# Patient Record
Sex: Female | Born: 1945 | Race: Black or African American | Hispanic: No | Smoking: Never smoker
Health system: Southern US, Community
[De-identification: ages and names within clinical notes are randomized; demographics above are authoritative.]

## PROBLEM LIST (undated history)

## (undated) DIAGNOSIS — K219 Gastro-esophageal reflux disease without esophagitis: Secondary | ICD-10-CM

## (undated) DIAGNOSIS — G709 Myoneural disorder, unspecified: Secondary | ICD-10-CM

## (undated) DIAGNOSIS — Z923 Personal history of irradiation: Secondary | ICD-10-CM

## (undated) DIAGNOSIS — R002 Palpitations: Secondary | ICD-10-CM

## (undated) DIAGNOSIS — C50919 Malignant neoplasm of unspecified site of unspecified female breast: Secondary | ICD-10-CM

## (undated) DIAGNOSIS — R21 Rash and other nonspecific skin eruption: Secondary | ICD-10-CM

## (undated) DIAGNOSIS — Z9221 Personal history of antineoplastic chemotherapy: Secondary | ICD-10-CM

## (undated) DIAGNOSIS — F419 Anxiety disorder, unspecified: Secondary | ICD-10-CM

## (undated) DIAGNOSIS — Z853 Personal history of malignant neoplasm of breast: Secondary | ICD-10-CM

## (undated) HISTORY — PX: BREAST SURGERY: SHX581

## (undated) HISTORY — DX: Malignant neoplasm of unspecified site of unspecified female breast: C50.919

## (undated) HISTORY — DX: Personal history of malignant neoplasm of breast: Z85.3

## (undated) HISTORY — DX: Gastro-esophageal reflux disease without esophagitis: K21.9

## (undated) HISTORY — PX: BLADDER SURGERY: SHX569

## (undated) HISTORY — PX: TUBAL LIGATION: SHX77

## (undated) HISTORY — PX: FOOT SURGERY: SHX648

## (undated) HISTORY — DX: Anxiety disorder, unspecified: F41.9

## (undated) HISTORY — DX: Palpitations: R00.2

## (undated) HISTORY — DX: Rash and other nonspecific skin eruption: R21

---

## 1997-11-14 ENCOUNTER — Other Ambulatory Visit: Admission: RE | Admit: 1997-11-14 | Discharge: 1997-11-14 | Payer: Self-pay | Admitting: Gynecology

## 1998-10-31 ENCOUNTER — Other Ambulatory Visit: Admission: RE | Admit: 1998-10-31 | Discharge: 1998-10-31 | Payer: Self-pay | Admitting: Gynecology

## 1999-11-28 ENCOUNTER — Other Ambulatory Visit: Admission: RE | Admit: 1999-11-28 | Discharge: 1999-11-28 | Payer: Self-pay | Admitting: Gynecology

## 2000-03-26 ENCOUNTER — Encounter: Admission: RE | Admit: 2000-03-26 | Discharge: 2000-03-26 | Payer: Self-pay | Admitting: Gynecology

## 2000-03-26 ENCOUNTER — Encounter: Payer: Self-pay | Admitting: Gynecology

## 2000-12-01 ENCOUNTER — Other Ambulatory Visit: Admission: RE | Admit: 2000-12-01 | Discharge: 2000-12-01 | Payer: Self-pay | Admitting: Gynecology

## 2001-03-28 ENCOUNTER — Encounter: Payer: Self-pay | Admitting: Gynecology

## 2001-03-28 ENCOUNTER — Encounter: Admission: RE | Admit: 2001-03-28 | Discharge: 2001-03-28 | Payer: Self-pay | Admitting: Gynecology

## 2001-12-15 ENCOUNTER — Other Ambulatory Visit: Admission: RE | Admit: 2001-12-15 | Discharge: 2001-12-15 | Payer: Self-pay | Admitting: Gynecology

## 2002-02-02 ENCOUNTER — Encounter: Payer: Self-pay | Admitting: Family Medicine

## 2002-02-02 ENCOUNTER — Encounter: Admission: RE | Admit: 2002-02-02 | Discharge: 2002-02-02 | Payer: Self-pay | Admitting: Family Medicine

## 2002-04-07 ENCOUNTER — Encounter: Payer: Self-pay | Admitting: Gynecology

## 2002-04-07 ENCOUNTER — Encounter: Admission: RE | Admit: 2002-04-07 | Discharge: 2002-04-07 | Payer: Self-pay | Admitting: Gynecology

## 2002-12-26 ENCOUNTER — Other Ambulatory Visit: Admission: RE | Admit: 2002-12-26 | Discharge: 2002-12-26 | Payer: Self-pay | Admitting: Gynecology

## 2003-04-27 ENCOUNTER — Encounter: Admission: RE | Admit: 2003-04-27 | Discharge: 2003-04-27 | Payer: Self-pay | Admitting: Gynecology

## 2003-08-21 ENCOUNTER — Emergency Department (HOSPITAL_COMMUNITY): Admission: EM | Admit: 2003-08-21 | Discharge: 2003-08-21 | Payer: Self-pay | Admitting: Emergency Medicine

## 2003-09-05 ENCOUNTER — Encounter: Admission: RE | Admit: 2003-09-05 | Discharge: 2003-09-05 | Payer: Self-pay | Admitting: Family Medicine

## 2004-01-11 ENCOUNTER — Other Ambulatory Visit: Admission: RE | Admit: 2004-01-11 | Discharge: 2004-01-11 | Payer: Self-pay | Admitting: Gynecology

## 2004-05-13 ENCOUNTER — Encounter: Admission: RE | Admit: 2004-05-13 | Discharge: 2004-05-13 | Payer: Self-pay | Admitting: Gynecology

## 2005-01-12 ENCOUNTER — Other Ambulatory Visit: Admission: RE | Admit: 2005-01-12 | Discharge: 2005-01-12 | Payer: Self-pay | Admitting: Gynecology

## 2005-06-18 ENCOUNTER — Encounter: Admission: RE | Admit: 2005-06-18 | Discharge: 2005-06-18 | Payer: Self-pay | Admitting: Gynecology

## 2006-01-14 ENCOUNTER — Other Ambulatory Visit: Admission: RE | Admit: 2006-01-14 | Discharge: 2006-01-14 | Payer: Self-pay | Admitting: Gynecology

## 2006-06-21 ENCOUNTER — Encounter: Admission: RE | Admit: 2006-06-21 | Discharge: 2006-06-21 | Payer: Self-pay | Admitting: Gynecology

## 2007-01-18 ENCOUNTER — Other Ambulatory Visit: Admission: RE | Admit: 2007-01-18 | Discharge: 2007-01-18 | Payer: Self-pay | Admitting: Gynecology

## 2007-06-28 ENCOUNTER — Encounter: Admission: RE | Admit: 2007-06-28 | Discharge: 2007-06-28 | Payer: Self-pay | Admitting: Gynecology

## 2008-07-10 ENCOUNTER — Encounter: Admission: RE | Admit: 2008-07-10 | Discharge: 2008-07-10 | Payer: Self-pay | Admitting: Gynecology

## 2009-01-20 ENCOUNTER — Emergency Department (HOSPITAL_COMMUNITY): Admission: EM | Admit: 2009-01-20 | Discharge: 2009-01-20 | Payer: Self-pay | Admitting: Family Medicine

## 2009-02-14 ENCOUNTER — Encounter: Admission: RE | Admit: 2009-02-14 | Discharge: 2009-02-14 | Payer: Self-pay | Admitting: Family Medicine

## 2009-07-11 ENCOUNTER — Encounter: Admission: RE | Admit: 2009-07-11 | Discharge: 2009-07-11 | Payer: Self-pay | Admitting: Family Medicine

## 2010-06-05 ENCOUNTER — Encounter
Admission: RE | Admit: 2010-06-05 | Discharge: 2010-06-05 | Payer: Self-pay | Source: Home / Self Care | Attending: Gynecology | Admitting: Gynecology

## 2010-06-05 DIAGNOSIS — Z853 Personal history of malignant neoplasm of breast: Secondary | ICD-10-CM

## 2010-06-05 DIAGNOSIS — C50411 Malignant neoplasm of upper-outer quadrant of right female breast: Secondary | ICD-10-CM | POA: Insufficient documentation

## 2010-06-05 HISTORY — DX: Personal history of malignant neoplasm of breast: Z85.3

## 2010-06-09 ENCOUNTER — Encounter
Admission: RE | Admit: 2010-06-09 | Discharge: 2010-06-09 | Payer: Self-pay | Source: Home / Self Care | Attending: Gynecology | Admitting: Gynecology

## 2010-06-09 ENCOUNTER — Ambulatory Visit: Payer: Self-pay | Admitting: Oncology

## 2010-06-13 LAB — CBC WITH DIFFERENTIAL/PLATELET
BASO%: 0.5 % (ref 0.0–2.0)
EOS%: 1.7 % (ref 0.0–7.0)
HCT: 37.4 % (ref 34.8–46.6)
LYMPH%: 39.5 % (ref 14.0–49.7)
MCH: 30.3 pg (ref 25.1–34.0)
MCHC: 33.5 g/dL (ref 31.5–36.0)
MCV: 90.4 fL (ref 79.5–101.0)
MONO%: 8.6 % (ref 0.0–14.0)
NEUT%: 49.7 % (ref 38.4–76.8)
Platelets: 276 10*3/uL (ref 145–400)
RBC: 4.14 10*6/uL (ref 3.70–5.45)
WBC: 4.1 10*3/uL (ref 3.9–10.3)

## 2010-06-13 LAB — COMPREHENSIVE METABOLIC PANEL
ALT: 13 U/L (ref 0–35)
Alkaline Phosphatase: 67 U/L (ref 39–117)
Creatinine, Ser: 0.98 mg/dL (ref 0.40–1.20)
Sodium: 139 mEq/L (ref 135–145)
Total Bilirubin: 0.7 mg/dL (ref 0.3–1.2)
Total Protein: 7.6 g/dL (ref 6.0–8.3)

## 2010-06-13 LAB — CANCER ANTIGEN 27.29: CA 27.29: 21 U/mL (ref 0–39)

## 2010-06-17 ENCOUNTER — Ambulatory Visit (HOSPITAL_COMMUNITY)
Admission: RE | Admit: 2010-06-17 | Discharge: 2010-06-17 | Payer: Self-pay | Source: Home / Self Care | Attending: Surgery | Admitting: Surgery

## 2010-06-17 HISTORY — PX: PORTACATH PLACEMENT: SHX2246

## 2010-06-18 ENCOUNTER — Encounter: Payer: Self-pay | Admitting: Oncology

## 2010-06-18 ENCOUNTER — Ambulatory Visit (HOSPITAL_COMMUNITY)
Admission: RE | Admit: 2010-06-18 | Discharge: 2010-06-18 | Payer: Self-pay | Source: Home / Self Care | Attending: Oncology | Admitting: Oncology

## 2010-06-20 ENCOUNTER — Ambulatory Visit (HOSPITAL_COMMUNITY)
Admission: RE | Admit: 2010-06-20 | Discharge: 2010-06-20 | Payer: Self-pay | Source: Home / Self Care | Attending: Oncology | Admitting: Oncology

## 2010-06-25 LAB — BASIC METABOLIC PANEL
BUN: 13 mg/dL (ref 6–23)
CO2: 27 mEq/L (ref 19–32)
Chloride: 103 mEq/L (ref 96–112)
Creatinine, Ser: 0.94 mg/dL (ref 0.40–1.20)
Glucose, Bld: 134 mg/dL — ABNORMAL HIGH (ref 70–99)

## 2010-06-27 ENCOUNTER — Emergency Department (HOSPITAL_COMMUNITY)
Admission: EM | Admit: 2010-06-27 | Discharge: 2010-06-28 | Payer: Self-pay | Source: Home / Self Care | Admitting: Emergency Medicine

## 2010-06-29 HISTORY — PX: BREAST LUMPECTOMY: SHX2

## 2010-07-01 ENCOUNTER — Emergency Department (HOSPITAL_COMMUNITY)
Admission: EM | Admit: 2010-07-01 | Discharge: 2010-07-01 | Payer: Self-pay | Source: Home / Self Care | Admitting: Emergency Medicine

## 2010-07-02 LAB — COMPREHENSIVE METABOLIC PANEL
ALT: 12 U/L (ref 0–35)
AST: 16 U/L (ref 0–37)
Albumin: 4.6 g/dL (ref 3.5–5.2)
Alkaline Phosphatase: 69 U/L (ref 39–117)
BUN: 20 mg/dL (ref 6–23)
CO2: 26 mEq/L (ref 19–32)
Calcium: 9.4 mg/dL (ref 8.4–10.5)
Chloride: 98 mEq/L (ref 96–112)
Creatinine, Ser: 0.96 mg/dL (ref 0.40–1.20)
Glucose, Bld: 98 mg/dL (ref 70–99)
Potassium: 5 mEq/L (ref 3.5–5.3)
Sodium: 133 mEq/L — ABNORMAL LOW (ref 135–145)
Total Bilirubin: 0.9 mg/dL (ref 0.3–1.2)
Total Protein: 7.7 g/dL (ref 6.0–8.3)

## 2010-07-02 LAB — CBC WITH DIFFERENTIAL/PLATELET
BASO%: 1.1 % (ref 0.0–2.0)
Basophils Absolute: 0 10*3/uL (ref 0.0–0.1)
EOS%: 0.9 % (ref 0.0–7.0)
Eosinophils Absolute: 0 10*3/uL (ref 0.0–0.5)
HCT: 36.4 % (ref 34.8–46.6)
HGB: 12.1 g/dL (ref 11.6–15.9)
LYMPH%: 39.5 % (ref 14.0–49.7)
MCH: 29.4 pg (ref 25.1–34.0)
MCHC: 33.2 g/dL (ref 31.5–36.0)
MCV: 88.3 fL (ref 79.5–101.0)
MONO#: 0.1 10*3/uL (ref 0.1–0.9)
MONO%: 2.3 % (ref 0.0–14.0)
NEUT#: 2 10*3/uL (ref 1.5–6.5)
NEUT%: 56.2 % (ref 38.4–76.8)
Platelets: 226 10*3/uL (ref 145–400)
RBC: 4.12 10*6/uL (ref 3.70–5.45)
RDW: 13 % (ref 11.2–14.5)
WBC: 3.5 10*3/uL — ABNORMAL LOW (ref 3.9–10.3)
lymph#: 1.4 10*3/uL (ref 0.9–3.3)

## 2010-07-09 ENCOUNTER — Ambulatory Visit (HOSPITAL_BASED_OUTPATIENT_CLINIC_OR_DEPARTMENT_OTHER): Payer: Medicare Other | Admitting: Oncology

## 2010-07-09 LAB — CBC WITH DIFFERENTIAL/PLATELET
BASO%: 0.5 % (ref 0.0–2.0)
Basophils Absolute: 0 10*3/uL (ref 0.0–0.1)
EOS%: 0 % (ref 0.0–7.0)
Eosinophils Absolute: 0 10*3/uL (ref 0.0–0.5)
HCT: 33.7 % — ABNORMAL LOW (ref 34.8–46.6)
HGB: 11.7 g/dL (ref 11.6–15.9)
LYMPH%: 46.2 % (ref 14.0–49.7)
MCH: 30 pg (ref 25.1–34.0)
MCHC: 34.7 g/dL (ref 31.5–36.0)
MCV: 86.4 fL (ref 79.5–101.0)
MONO#: 0.1 10*3/uL (ref 0.1–0.9)
MONO%: 5.3 % (ref 0.0–14.0)
NEUT#: 1 10*3/uL — ABNORMAL LOW (ref 1.5–6.5)
NEUT%: 48 % (ref 38.4–76.8)
Platelets: 220 10*3/uL (ref 145–400)
RBC: 3.9 10*6/uL (ref 3.70–5.45)
RDW: 12.9 % (ref 11.2–14.5)
WBC: 2.1 10*3/uL — ABNORMAL LOW (ref 3.9–10.3)
lymph#: 1 10*3/uL (ref 0.9–3.3)
nRBC: 0 % (ref 0–0)

## 2010-07-09 LAB — COMPREHENSIVE METABOLIC PANEL
ALT: 13 U/L (ref 0–35)
AST: 18 U/L (ref 0–37)
Albumin: 4.6 g/dL (ref 3.5–5.2)
Alkaline Phosphatase: 65 U/L (ref 39–117)
BUN: 16 mg/dL (ref 6–23)
CO2: 21 mEq/L (ref 19–32)
Calcium: 9.8 mg/dL (ref 8.4–10.5)
Chloride: 99 mEq/L (ref 96–112)
Creatinine, Ser: 0.88 mg/dL (ref 0.40–1.20)
Glucose, Bld: 96 mg/dL (ref 70–99)
Potassium: 4.4 mEq/L (ref 3.5–5.3)
Sodium: 132 mEq/L — ABNORMAL LOW (ref 135–145)
Total Bilirubin: 0.6 mg/dL (ref 0.3–1.2)
Total Protein: 7.5 g/dL (ref 6.0–8.3)

## 2010-07-15 LAB — CBC WITH DIFFERENTIAL/PLATELET
BASO%: 0.2 % (ref 0.0–2.0)
Basophils Absolute: 0 10*3/uL (ref 0.0–0.1)
EOS%: 0 % (ref 0.0–7.0)
Eosinophils Absolute: 0 10*3/uL (ref 0.0–0.5)
HCT: 34.2 % — ABNORMAL LOW (ref 34.8–46.6)
HGB: 11.4 g/dL — ABNORMAL LOW (ref 11.6–15.9)
LYMPH%: 9 % — ABNORMAL LOW (ref 14.0–49.7)
MCH: 30.2 pg (ref 25.1–34.0)
MCHC: 33.3 g/dL (ref 31.5–36.0)
MCV: 90.8 fL (ref 79.5–101.0)
MONO#: 0.8 10*3/uL (ref 0.1–0.9)
MONO%: 7.6 % (ref 0.0–14.0)
NEUT#: 9.1 10*3/uL — ABNORMAL HIGH (ref 1.5–6.5)
NEUT%: 83.2 % — ABNORMAL HIGH (ref 38.4–76.8)
Platelets: 209 10*3/uL (ref 145–400)
RBC: 3.76 10*6/uL (ref 3.70–5.45)
RDW: 13.5 % (ref 11.2–14.5)
WBC: 11 10*3/uL — ABNORMAL HIGH (ref 3.9–10.3)
lymph#: 1 10*3/uL (ref 0.9–3.3)

## 2010-07-15 LAB — COMPREHENSIVE METABOLIC PANEL
ALT: 14 U/L (ref 0–35)
AST: 17 U/L (ref 0–37)
Albumin: 4.5 g/dL (ref 3.5–5.2)
Alkaline Phosphatase: 82 U/L (ref 39–117)
BUN: 8 mg/dL (ref 6–23)
CO2: 25 mEq/L (ref 19–32)
Calcium: 9.5 mg/dL (ref 8.4–10.5)
Chloride: 95 mEq/L — ABNORMAL LOW (ref 96–112)
Creatinine, Ser: 1.02 mg/dL (ref 0.40–1.20)
Glucose, Bld: 149 mg/dL — ABNORMAL HIGH (ref 70–99)
Potassium: 4.5 mEq/L (ref 3.5–5.3)
Sodium: 131 mEq/L — ABNORMAL LOW (ref 135–145)
Total Bilirubin: 0.3 mg/dL (ref 0.3–1.2)
Total Protein: 7.3 g/dL (ref 6.0–8.3)

## 2010-07-18 ENCOUNTER — Other Ambulatory Visit (HOSPITAL_COMMUNITY): Payer: Self-pay | Admitting: Oncology

## 2010-07-18 DIAGNOSIS — C50919 Malignant neoplasm of unspecified site of unspecified female breast: Secondary | ICD-10-CM

## 2010-07-20 ENCOUNTER — Encounter: Payer: Self-pay | Admitting: Gynecology

## 2010-07-22 ENCOUNTER — Ambulatory Visit: Admit: 2010-07-22 | Payer: Self-pay | Admitting: Psychiatry

## 2010-07-23 LAB — CBC WITH DIFFERENTIAL/PLATELET
BASO%: 1.3 % (ref 0.0–2.0)
Basophils Absolute: 0 10*3/uL (ref 0.0–0.1)
EOS%: 0 % (ref 0.0–7.0)
HCT: 34.7 % — ABNORMAL LOW (ref 34.8–46.6)
HGB: 11.7 g/dL (ref 11.6–15.9)
LYMPH%: 33.4 % (ref 14.0–49.7)
MCH: 29.8 pg (ref 25.1–34.0)
MCHC: 33.7 g/dL (ref 31.5–36.0)
MCV: 88.5 fL (ref 79.5–101.0)
MONO%: 9.1 % (ref 0.0–14.0)
NEUT%: 56.2 % (ref 38.4–76.8)
Platelets: 144 10*3/uL — ABNORMAL LOW (ref 145–400)
lymph#: 1 10*3/uL (ref 0.9–3.3)

## 2010-07-23 LAB — COMPREHENSIVE METABOLIC PANEL
AST: 19 U/L (ref 0–37)
Albumin: 4.4 g/dL (ref 3.5–5.2)
Alkaline Phosphatase: 67 U/L (ref 39–117)
BUN: 9 mg/dL (ref 6–23)
Creatinine, Ser: 0.82 mg/dL (ref 0.40–1.20)
Glucose, Bld: 123 mg/dL — ABNORMAL HIGH (ref 70–99)
Potassium: 4.1 mEq/L (ref 3.5–5.3)

## 2010-07-30 ENCOUNTER — Encounter (HOSPITAL_BASED_OUTPATIENT_CLINIC_OR_DEPARTMENT_OTHER): Payer: Medicare Other | Admitting: Oncology

## 2010-07-30 DIAGNOSIS — Z5111 Encounter for antineoplastic chemotherapy: Secondary | ICD-10-CM

## 2010-07-30 DIAGNOSIS — C773 Secondary and unspecified malignant neoplasm of axilla and upper limb lymph nodes: Secondary | ICD-10-CM

## 2010-07-30 DIAGNOSIS — C50919 Malignant neoplasm of unspecified site of unspecified female breast: Secondary | ICD-10-CM

## 2010-07-30 LAB — CBC WITH DIFFERENTIAL/PLATELET
Basophils Absolute: 0 10*3/uL (ref 0.0–0.1)
Eosinophils Absolute: 0 10*3/uL (ref 0.0–0.5)
HCT: 31.7 % — ABNORMAL LOW (ref 34.8–46.6)
HGB: 10.8 g/dL — ABNORMAL LOW (ref 11.6–15.9)
MCV: 87.8 fL (ref 79.5–101.0)
MONO%: 5.5 % (ref 0.0–14.0)
NEUT#: 0.4 10*3/uL — CL (ref 1.5–6.5)
NEUT%: 28.3 % — ABNORMAL LOW (ref 38.4–76.8)
RDW: 13.8 % (ref 11.2–14.5)
lymph#: 0.9 10*3/uL (ref 0.9–3.3)

## 2010-08-06 ENCOUNTER — Other Ambulatory Visit: Payer: Self-pay | Admitting: Oncology

## 2010-08-06 ENCOUNTER — Encounter (HOSPITAL_BASED_OUTPATIENT_CLINIC_OR_DEPARTMENT_OTHER): Payer: Medicare Other | Admitting: Oncology

## 2010-08-06 DIAGNOSIS — Z5112 Encounter for antineoplastic immunotherapy: Secondary | ICD-10-CM

## 2010-08-06 DIAGNOSIS — C50919 Malignant neoplasm of unspecified site of unspecified female breast: Secondary | ICD-10-CM

## 2010-08-06 DIAGNOSIS — D709 Neutropenia, unspecified: Secondary | ICD-10-CM

## 2010-08-06 DIAGNOSIS — C773 Secondary and unspecified malignant neoplasm of axilla and upper limb lymph nodes: Secondary | ICD-10-CM

## 2010-08-06 DIAGNOSIS — Z5111 Encounter for antineoplastic chemotherapy: Secondary | ICD-10-CM

## 2010-08-06 LAB — CBC WITH DIFFERENTIAL/PLATELET
Basophils Absolute: 0 10*3/uL (ref 0.0–0.1)
Eosinophils Absolute: 0 10*3/uL (ref 0.0–0.5)
HCT: 30.2 % — ABNORMAL LOW (ref 34.8–46.6)
LYMPH%: 49.2 % (ref 14.0–49.7)
MCV: 89.1 fL (ref 79.5–101.0)
MONO#: 0.4 10*3/uL (ref 0.1–0.9)
MONO%: 17.9 % — ABNORMAL HIGH (ref 0.0–14.0)
NEUT#: 0.8 10*3/uL — ABNORMAL LOW (ref 1.5–6.5)
NEUT%: 32.1 % — ABNORMAL LOW (ref 38.4–76.8)
Platelets: 273 10*3/uL (ref 145–400)
WBC: 2.5 10*3/uL — ABNORMAL LOW (ref 3.9–10.3)

## 2010-08-07 ENCOUNTER — Encounter (HOSPITAL_BASED_OUTPATIENT_CLINIC_OR_DEPARTMENT_OTHER): Payer: Medicare Other | Admitting: Oncology

## 2010-08-07 DIAGNOSIS — C50919 Malignant neoplasm of unspecified site of unspecified female breast: Secondary | ICD-10-CM

## 2010-08-08 ENCOUNTER — Other Ambulatory Visit: Payer: Self-pay | Admitting: Oncology

## 2010-08-08 ENCOUNTER — Encounter (HOSPITAL_BASED_OUTPATIENT_CLINIC_OR_DEPARTMENT_OTHER): Payer: Medicare Other | Admitting: Oncology

## 2010-08-08 DIAGNOSIS — Z5111 Encounter for antineoplastic chemotherapy: Secondary | ICD-10-CM

## 2010-08-08 DIAGNOSIS — R109 Unspecified abdominal pain: Secondary | ICD-10-CM

## 2010-08-08 DIAGNOSIS — C773 Secondary and unspecified malignant neoplasm of axilla and upper limb lymph nodes: Secondary | ICD-10-CM

## 2010-08-08 DIAGNOSIS — R110 Nausea: Secondary | ICD-10-CM

## 2010-08-08 DIAGNOSIS — C50919 Malignant neoplasm of unspecified site of unspecified female breast: Secondary | ICD-10-CM

## 2010-08-08 LAB — URINALYSIS, MICROSCOPIC - CHCC
Bilirubin (Urine): NEGATIVE
Blood: NEGATIVE
Leukocyte Esterase: NEGATIVE
Protein: NEGATIVE mg/dL
pH: 6 (ref 4.6–8.0)

## 2010-08-08 LAB — CBC WITH DIFFERENTIAL/PLATELET
Basophils Absolute: 0 10*3/uL (ref 0.0–0.1)
EOS%: 0 % (ref 0.0–7.0)
HCT: 30.1 % — ABNORMAL LOW (ref 34.8–46.6)
HGB: 10.2 g/dL — ABNORMAL LOW (ref 11.6–15.9)
LYMPH%: 5.5 % — ABNORMAL LOW (ref 14.0–49.7)
MCH: 31.1 pg (ref 25.1–34.0)
MCV: 91.6 fL (ref 79.5–101.0)
MONO%: 1.9 % (ref 0.0–14.0)
NEUT%: 92.4 % — ABNORMAL HIGH (ref 38.4–76.8)
Platelets: 269 10*3/uL (ref 145–400)
lymph#: 0.6 10*3/uL — ABNORMAL LOW (ref 0.9–3.3)

## 2010-08-08 LAB — COMPREHENSIVE METABOLIC PANEL
ALT: 17 U/L (ref 0–35)
CO2: 24 mEq/L (ref 19–32)
Calcium: 9.4 mg/dL (ref 8.4–10.5)
Chloride: 95 mEq/L — ABNORMAL LOW (ref 96–112)
Creatinine, Ser: 0.84 mg/dL (ref 0.40–1.20)
Total Protein: 6.7 g/dL (ref 6.0–8.3)

## 2010-08-12 ENCOUNTER — Emergency Department (HOSPITAL_COMMUNITY)
Admission: EM | Admit: 2010-08-12 | Discharge: 2010-08-13 | Disposition: A | Discharge status: Home / Self Care | Payer: Medicare Other | Attending: Emergency Medicine | Admitting: Emergency Medicine

## 2010-08-12 DIAGNOSIS — R198 Other specified symptoms and signs involving the digestive system and abdomen: Secondary | ICD-10-CM | POA: Insufficient documentation

## 2010-08-12 DIAGNOSIS — Z853 Personal history of malignant neoplasm of breast: Secondary | ICD-10-CM | POA: Insufficient documentation

## 2010-08-12 DIAGNOSIS — K219 Gastro-esophageal reflux disease without esophagitis: Secondary | ICD-10-CM | POA: Insufficient documentation

## 2010-08-12 LAB — COMPREHENSIVE METABOLIC PANEL
AST: 21 U/L (ref 0–37)
Albumin: 4.1 g/dL (ref 3.5–5.2)
CO2: 26 mEq/L (ref 19–32)
Calcium: 9.7 mg/dL (ref 8.4–10.5)
Creatinine, Ser: 0.74 mg/dL (ref 0.4–1.2)
GFR calc Af Amer: >60 mL/min (ref >60)
GFR calc non Af Amer: >60 mL/min (ref >60)

## 2010-08-12 LAB — DIFFERENTIAL
Basophils Absolute: 0 10*3/uL (ref 0.0–0.1)
Lymphs Abs: 2.4 10*3/uL (ref 0.7–4.0)
Monocytes Absolute: 2.4 10*3/uL — ABNORMAL HIGH (ref 0.1–1.0)
Monocytes Relative: 15 % — ABNORMAL HIGH (ref 3–12)

## 2010-08-12 LAB — URINALYSIS, ROUTINE W REFLEX MICROSCOPIC
Protein, ur: NEGATIVE mg/dL
Urobilinogen, UA: 0.2 mg/dL (ref 0.0–1.0)

## 2010-08-12 LAB — CBC
MCHC: 35.6 g/dL (ref 30.0–36.0)
Platelets: 201 10*3/uL (ref 150–400)
RDW: 15.6 % — ABNORMAL HIGH (ref 11.5–15.5)

## 2010-08-13 ENCOUNTER — Other Ambulatory Visit: Payer: Self-pay | Admitting: Oncology

## 2010-08-13 ENCOUNTER — Encounter (HOSPITAL_BASED_OUTPATIENT_CLINIC_OR_DEPARTMENT_OTHER): Payer: Medicare Other | Admitting: Oncology

## 2010-08-13 DIAGNOSIS — Z5111 Encounter for antineoplastic chemotherapy: Secondary | ICD-10-CM

## 2010-08-13 DIAGNOSIS — Z5112 Encounter for antineoplastic immunotherapy: Secondary | ICD-10-CM

## 2010-08-13 DIAGNOSIS — C773 Secondary and unspecified malignant neoplasm of axilla and upper limb lymph nodes: Secondary | ICD-10-CM

## 2010-08-13 DIAGNOSIS — C50919 Malignant neoplasm of unspecified site of unspecified female breast: Secondary | ICD-10-CM

## 2010-08-13 LAB — CBC WITH DIFFERENTIAL/PLATELET
Basophils Absolute: 0 10*3/uL (ref 0.0–0.1)
EOS%: 0 % (ref 0.0–7.0)
Eosinophils Absolute: 0 10*3/uL (ref 0.0–0.5)
HCT: 28.9 % — ABNORMAL LOW (ref 34.8–46.6)
HGB: 9.7 g/dL — ABNORMAL LOW (ref 11.6–15.9)
MCH: 30.8 pg (ref 25.1–34.0)
NEUT#: 14.2 10*3/uL — ABNORMAL HIGH (ref 1.5–6.5)
NEUT%: 93.5 % — ABNORMAL HIGH (ref 38.4–76.8)
lymph#: 0.7 10*3/uL — ABNORMAL LOW (ref 0.9–3.3)

## 2010-08-20 ENCOUNTER — Other Ambulatory Visit: Payer: Self-pay | Admitting: Oncology

## 2010-08-20 ENCOUNTER — Encounter (HOSPITAL_BASED_OUTPATIENT_CLINIC_OR_DEPARTMENT_OTHER): Payer: Medicare Other | Admitting: Oncology

## 2010-08-20 DIAGNOSIS — C50919 Malignant neoplasm of unspecified site of unspecified female breast: Secondary | ICD-10-CM

## 2010-08-20 DIAGNOSIS — C773 Secondary and unspecified malignant neoplasm of axilla and upper limb lymph nodes: Secondary | ICD-10-CM

## 2010-08-20 DIAGNOSIS — Z5111 Encounter for antineoplastic chemotherapy: Secondary | ICD-10-CM

## 2010-08-20 DIAGNOSIS — Z171 Estrogen receptor negative status [ER-]: Secondary | ICD-10-CM

## 2010-08-20 DIAGNOSIS — Z5112 Encounter for antineoplastic immunotherapy: Secondary | ICD-10-CM

## 2010-08-20 LAB — CBC WITH DIFFERENTIAL/PLATELET
BASO%: 0.6 % (ref 0.0–2.0)
Basophils Absolute: 0 10*3/uL (ref 0.0–0.1)
HCT: 32.5 % — ABNORMAL LOW (ref 34.8–46.6)
HGB: 11.2 g/dL — ABNORMAL LOW (ref 11.6–15.9)
LYMPH%: 18.9 % (ref 14.0–49.7)
MCH: 30.6 pg (ref 25.1–34.0)
MCHC: 34.5 g/dL (ref 31.5–36.0)
MONO#: 0.7 10*3/uL (ref 0.1–0.9)
NEUT%: 68.3 % (ref 38.4–76.8)
Platelets: 240 10*3/uL (ref 145–400)
lymph#: 1 10*3/uL (ref 0.9–3.3)

## 2010-08-20 LAB — COMPREHENSIVE METABOLIC PANEL
AST: 13 U/L (ref 0–37)
Albumin: 4.4 g/dL (ref 3.5–5.2)
BUN: 11 mg/dL (ref 6–23)
Calcium: 9.6 mg/dL (ref 8.4–10.5)
Chloride: 95 mEq/L — ABNORMAL LOW (ref 96–112)
Creatinine, Ser: 0.88 mg/dL (ref 0.40–1.20)
Glucose, Bld: 138 mg/dL — ABNORMAL HIGH (ref 70–99)
Potassium: 4.3 mEq/L (ref 3.5–5.3)

## 2010-08-27 ENCOUNTER — Other Ambulatory Visit: Payer: Self-pay | Admitting: Physician Assistant

## 2010-08-27 ENCOUNTER — Encounter (HOSPITAL_BASED_OUTPATIENT_CLINIC_OR_DEPARTMENT_OTHER): Payer: Medicare Other | Admitting: Oncology

## 2010-08-27 ENCOUNTER — Other Ambulatory Visit: Payer: Self-pay | Admitting: Oncology

## 2010-08-27 DIAGNOSIS — D709 Neutropenia, unspecified: Secondary | ICD-10-CM

## 2010-08-27 DIAGNOSIS — Z5111 Encounter for antineoplastic chemotherapy: Secondary | ICD-10-CM

## 2010-08-27 DIAGNOSIS — C50919 Malignant neoplasm of unspecified site of unspecified female breast: Secondary | ICD-10-CM

## 2010-08-27 DIAGNOSIS — C773 Secondary and unspecified malignant neoplasm of axilla and upper limb lymph nodes: Secondary | ICD-10-CM

## 2010-08-27 DIAGNOSIS — Z5112 Encounter for antineoplastic immunotherapy: Secondary | ICD-10-CM

## 2010-08-27 LAB — CBC WITH DIFFERENTIAL/PLATELET
BASO%: 2.6 % — ABNORMAL HIGH (ref 0.0–2.0)
HCT: 28.7 % — ABNORMAL LOW (ref 34.8–46.6)
HGB: 9.9 g/dL — ABNORMAL LOW (ref 11.6–15.9)
LYMPH%: 35.6 % (ref 14.0–49.7)
MONO%: 8.4 % (ref 0.0–14.0)
NEUT#: 1 10*3/uL — ABNORMAL LOW (ref 1.5–6.5)
RDW: 16.4 % — ABNORMAL HIGH (ref 11.2–14.5)
WBC: 1.9 10*3/uL — ABNORMAL LOW (ref 3.9–10.3)
nRBC: 0 % (ref 0–0)

## 2010-08-27 LAB — BASIC METABOLIC PANEL
Calcium: 9.4 mg/dL (ref 8.4–10.5)
Creatinine, Ser: 0.71 mg/dL (ref 0.40–1.20)

## 2010-09-03 ENCOUNTER — Encounter (HOSPITAL_BASED_OUTPATIENT_CLINIC_OR_DEPARTMENT_OTHER): Payer: Medicare Other | Admitting: Oncology

## 2010-09-03 ENCOUNTER — Other Ambulatory Visit: Payer: Self-pay | Admitting: Physician Assistant

## 2010-09-03 DIAGNOSIS — Z5112 Encounter for antineoplastic immunotherapy: Secondary | ICD-10-CM

## 2010-09-03 DIAGNOSIS — C773 Secondary and unspecified malignant neoplasm of axilla and upper limb lymph nodes: Secondary | ICD-10-CM

## 2010-09-03 DIAGNOSIS — C50919 Malignant neoplasm of unspecified site of unspecified female breast: Secondary | ICD-10-CM

## 2010-09-03 DIAGNOSIS — Z5111 Encounter for antineoplastic chemotherapy: Secondary | ICD-10-CM

## 2010-09-03 LAB — COMPREHENSIVE METABOLIC PANEL
ALT: 17 U/L (ref 0–35)
AST: 25 U/L (ref 0–37)
Albumin: 4.3 g/dL (ref 3.5–5.2)
Alkaline Phosphatase: 79 U/L (ref 39–117)
Glucose, Bld: 125 mg/dL — ABNORMAL HIGH (ref 70–99)
Potassium: 4.4 mEq/L (ref 3.5–5.3)
Sodium: 121 mEq/L — ABNORMAL LOW (ref 135–145)
Total Protein: 6.4 g/dL (ref 6.0–8.3)

## 2010-09-03 LAB — CBC WITH DIFFERENTIAL/PLATELET
Basophils Absolute: 0 10*3/uL (ref 0.0–0.1)
EOS%: 0 % (ref 0.0–7.0)
Eosinophils Absolute: 0 10*3/uL (ref 0.0–0.5)
HGB: 10.3 g/dL — ABNORMAL LOW (ref 11.6–15.9)
MCH: 31.4 pg (ref 25.1–34.0)
NEUT#: 1.4 10*3/uL — ABNORMAL LOW (ref 1.5–6.5)
RBC: 3.28 10*6/uL — ABNORMAL LOW (ref 3.70–5.45)
RDW: 16.7 % — ABNORMAL HIGH (ref 11.2–14.5)
lymph#: 0.9 10*3/uL (ref 0.9–3.3)
nRBC: 0 % (ref 0–0)

## 2010-09-05 ENCOUNTER — Ambulatory Visit (HOSPITAL_COMMUNITY)
Admission: RE | Admit: 2010-09-05 | Discharge: 2010-09-05 | Disposition: A | Discharge status: Home / Self Care | Payer: Medicare Other | Source: Ambulatory Visit | Attending: Oncology | Admitting: Oncology

## 2010-09-05 DIAGNOSIS — C50919 Malignant neoplasm of unspecified site of unspecified female breast: Secondary | ICD-10-CM | POA: Insufficient documentation

## 2010-09-05 DIAGNOSIS — Z79899 Other long term (current) drug therapy: Secondary | ICD-10-CM | POA: Insufficient documentation

## 2010-09-05 DIAGNOSIS — C773 Secondary and unspecified malignant neoplasm of axilla and upper limb lymph nodes: Secondary | ICD-10-CM | POA: Insufficient documentation

## 2010-09-05 MED ORDER — GADOBENATE DIMEGLUMINE 529 MG/ML IV SOLN
15.0000 mL | Freq: Once | INTRAVENOUS | Status: AC | PRN
  Administered 2010-09-05: 15 mL via INTRAVENOUS

## 2010-09-08 LAB — POCT I-STAT, CHEM 8
BUN: 24 mg/dL — ABNORMAL HIGH (ref 6–23)
Calcium, Ion: 1.12 mmol/L (ref 1.12–1.32)
Chloride: 102 mEq/L (ref 96–112)
Creatinine, Ser: 0.9 mg/dL (ref 0.4–1.2)
Glucose, Bld: 146 mg/dL — ABNORMAL HIGH (ref 70–99)
HCT: 41 % (ref 36.0–46.0)
Hemoglobin: 13.9 g/dL (ref 12.0–15.0)
Potassium: 4.6 mEq/L (ref 3.5–5.1)
Sodium: 133 mEq/L — ABNORMAL LOW (ref 135–145)
TCO2: 27 mmol/L (ref 0–100)

## 2010-09-08 LAB — COMPREHENSIVE METABOLIC PANEL
ALT: 15 U/L (ref 0–35)
AST: 20 U/L (ref 0–37)
Albumin: 4.2 g/dL (ref 3.5–5.2)
Alkaline Phosphatase: 68 U/L (ref 39–117)
BUN: 19 mg/dL (ref 6–23)
CO2: 25 mEq/L (ref 19–32)
Calcium: 9.8 mg/dL (ref 8.4–10.5)
Chloride: 98 mEq/L (ref 96–112)
Creatinine, Ser: 0.96 mg/dL (ref 0.4–1.2)
GFR calc Af Amer: >60 mL/min (ref >60)
GFR calc non Af Amer: 58 mL/min — ABNORMAL LOW (ref >60)
Glucose, Bld: 145 mg/dL — ABNORMAL HIGH (ref 70–99)
Potassium: 4.7 mEq/L (ref 3.5–5.1)
Sodium: 133 mEq/L — ABNORMAL LOW (ref 135–145)
Total Bilirubin: 0.7 mg/dL (ref 0.3–1.2)
Total Protein: 7.9 g/dL (ref 6.0–8.3)

## 2010-09-08 LAB — CBC
HCT: 38.4 % (ref 36.0–46.0)
Hemoglobin: 12.7 g/dL (ref 12.0–15.0)
MCH: 29.9 pg (ref 26.0–34.0)
MCH: 30.1 pg (ref 26.0–34.0)
MCHC: 33 g/dL (ref 30.0–36.0)
MCHC: 33.1 g/dL (ref 30.0–36.0)
MCHC: 33.2 g/dL (ref 30.0–36.0)
MCV: 90.6 fL (ref 78.0–100.0)
MCV: 91 fL (ref 78.0–100.0)
Platelets: 253 10*3/uL (ref 150–400)
Platelets: 231 10*3/uL (ref 150–400)
Platelets: 236 10*3/uL (ref 150–400)
RBC: 4.25 MIL/uL (ref 3.87–5.11)
RBC: 4.22 MIL/uL (ref 3.87–5.11)
RDW: 13.7 % (ref 11.5–15.5)
RDW: 13.2 % (ref 11.5–15.5)
RDW: 13.5 % (ref 11.5–15.5)
WBC: 4.2 10*3/uL (ref 4.0–10.5)

## 2010-09-08 LAB — DIFFERENTIAL
Basophils Absolute: 0 10*3/uL (ref 0.0–0.1)
Basophils Absolute: 0 10*3/uL (ref 0.0–0.1)
Basophils Relative: 1 % (ref 0–1)
Basophils Relative: 1 % (ref 0–1)
Eosinophils Absolute: 0 10*3/uL (ref 0.0–0.7)
Eosinophils Absolute: 0 10*3/uL (ref 0.0–0.7)
Eosinophils Relative: 1 % (ref 0–5)
Lymphocytes Relative: 22 % (ref 12–46)
Lymphs Abs: 0.9 10*3/uL (ref 0.7–4.0)
Monocytes Absolute: 0.1 10*3/uL (ref 0.1–1.0)
Monocytes Relative: 2 % — ABNORMAL LOW (ref 3–12)
Neutro Abs: 2.9 10*3/uL (ref 1.7–7.7)
Neutro Abs: 3.1 10*3/uL (ref 1.7–7.7)
Neutrophils Relative %: 54 % (ref 43–77)
Neutrophils Relative %: 75 % (ref 43–77)

## 2010-09-08 LAB — BASIC METABOLIC PANEL
BUN: 17 mg/dL (ref 6–23)
Calcium: 9.2 mg/dL (ref 8.4–10.5)
Creatinine, Ser: 1.03 mg/dL (ref 0.4–1.2)
GFR calc non Af Amer: 54 mL/min — ABNORMAL LOW (ref >60)
Glucose, Bld: 125 mg/dL — ABNORMAL HIGH (ref 70–99)
Sodium: 137 mEq/L (ref 135–145)

## 2010-09-08 LAB — POCT CARDIAC MARKERS
CKMB, poc: 1.2 ng/mL (ref 1.0–8.0)
CKMB, poc: <1 ng/mL — ABNORMAL LOW (ref 1.0–8.0)
Myoglobin, poc: 82.2 ng/mL (ref 12–200)
Myoglobin, poc: 89.8 ng/mL (ref 12–200)
Troponin i, poc: <0.05 ng/mL (ref 0.00–0.09)
Troponin i, poc: <0.05 ng/mL (ref 0.00–0.09)

## 2010-09-08 LAB — SURGICAL PCR SCREEN
MRSA, PCR: NEGATIVE
Staphylococcus aureus: NEGATIVE

## 2010-09-08 LAB — LIPASE, BLOOD: Lipase: 22 U/L (ref 11–59)

## 2010-09-10 ENCOUNTER — Other Ambulatory Visit: Payer: Self-pay | Admitting: Physician Assistant

## 2010-09-10 ENCOUNTER — Other Ambulatory Visit: Payer: Self-pay | Admitting: Oncology

## 2010-09-10 ENCOUNTER — Encounter (HOSPITAL_BASED_OUTPATIENT_CLINIC_OR_DEPARTMENT_OTHER): Payer: Medicare Other | Admitting: Oncology

## 2010-09-10 DIAGNOSIS — Z5111 Encounter for antineoplastic chemotherapy: Secondary | ICD-10-CM

## 2010-09-10 DIAGNOSIS — C773 Secondary and unspecified malignant neoplasm of axilla and upper limb lymph nodes: Secondary | ICD-10-CM

## 2010-09-10 DIAGNOSIS — Z5112 Encounter for antineoplastic immunotherapy: Secondary | ICD-10-CM

## 2010-09-10 DIAGNOSIS — C50919 Malignant neoplasm of unspecified site of unspecified female breast: Secondary | ICD-10-CM

## 2010-09-10 LAB — COMPREHENSIVE METABOLIC PANEL
ALT: 17 U/L (ref 0–35)
Albumin: 4.3 g/dL (ref 3.5–5.2)
Alkaline Phosphatase: 79 U/L (ref 39–117)
CO2: 20 mEq/L (ref 19–32)
Glucose, Bld: 143 mg/dL — ABNORMAL HIGH (ref 70–99)
Potassium: 3.7 mEq/L (ref 3.5–5.3)
Sodium: 126 mEq/L — ABNORMAL LOW (ref 135–145)
Total Bilirubin: 0.4 mg/dL (ref 0.3–1.2)
Total Protein: 6.4 g/dL (ref 6.0–8.3)

## 2010-09-10 LAB — CBC WITH DIFFERENTIAL/PLATELET
Basophils Absolute: 0 10*3/uL (ref 0.0–0.1)
EOS%: 0 % (ref 0.0–7.0)
Eosinophils Absolute: 0 10*3/uL (ref 0.0–0.5)
HGB: 9.8 g/dL — ABNORMAL LOW (ref 11.6–15.9)
NEUT#: 1 10*3/uL — ABNORMAL LOW (ref 1.5–6.5)
RBC: 3.13 10*6/uL — ABNORMAL LOW (ref 3.70–5.45)
RDW: 16.4 % — ABNORMAL HIGH (ref 11.2–14.5)
lymph#: 0.8 10*3/uL — ABNORMAL LOW (ref 0.9–3.3)
nRBC: 0 % (ref 0–0)

## 2010-09-11 ENCOUNTER — Emergency Department (HOSPITAL_COMMUNITY)
Admission: EM | Admit: 2010-09-11 | Discharge: 2010-09-11 | Disposition: A | Discharge status: Home / Self Care | Payer: Medicare Other | Attending: Emergency Medicine | Admitting: Emergency Medicine

## 2010-09-11 DIAGNOSIS — E860 Dehydration: Secondary | ICD-10-CM | POA: Insufficient documentation

## 2010-09-11 DIAGNOSIS — R002 Palpitations: Secondary | ICD-10-CM | POA: Insufficient documentation

## 2010-09-11 DIAGNOSIS — C50919 Malignant neoplasm of unspecified site of unspecified female breast: Secondary | ICD-10-CM | POA: Insufficient documentation

## 2010-09-11 DIAGNOSIS — F411 Generalized anxiety disorder: Secondary | ICD-10-CM | POA: Insufficient documentation

## 2010-09-11 DIAGNOSIS — Z9221 Personal history of antineoplastic chemotherapy: Secondary | ICD-10-CM | POA: Insufficient documentation

## 2010-09-11 DIAGNOSIS — F410 Panic disorder [episodic paroxysmal anxiety] without agoraphobia: Secondary | ICD-10-CM | POA: Insufficient documentation

## 2010-09-11 LAB — DIFFERENTIAL
Basophils Relative: 1 % (ref 0–1)
Eosinophils Absolute: 0 10*3/uL (ref 0.0–0.7)
Eosinophils Relative: 1 % (ref 0–5)
Lymphocytes Relative: 48 % — ABNORMAL HIGH (ref 12–46)
Neutro Abs: 1 10*3/uL — ABNORMAL LOW (ref 1.7–7.7)
Neutrophils Relative %: 44 % (ref 43–77)

## 2010-09-11 LAB — BASIC METABOLIC PANEL
BUN: 6 mg/dL (ref 6–23)
CO2: 24 mEq/L (ref 19–32)
Chloride: 93 mEq/L — ABNORMAL LOW (ref 96–112)
GFR calc non Af Amer: >60 mL/min (ref >60)
Glucose, Bld: 94 mg/dL (ref 70–99)
Potassium: 3.7 mEq/L (ref 3.5–5.1)
Sodium: 125 mEq/L — ABNORMAL LOW (ref 135–145)

## 2010-09-11 LAB — CBC
HCT: 26.2 % — ABNORMAL LOW (ref 36.0–46.0)
Hemoglobin: 8.9 g/dL — ABNORMAL LOW (ref 12.0–15.0)
MCV: 91.6 fL (ref 78.0–100.0)
RBC: 2.86 MIL/uL — ABNORMAL LOW (ref 3.87–5.11)
WBC: 2.2 10*3/uL — ABNORMAL LOW (ref 4.0–10.5)

## 2010-09-11 LAB — POCT CARDIAC MARKERS: Myoglobin, poc: 72.7 ng/mL (ref 12–200)

## 2010-09-16 ENCOUNTER — Inpatient Hospital Stay (HOSPITAL_COMMUNITY)
Admission: EM | Admit: 2010-09-16 | Discharge: 2010-09-19 | DRG: 640 | Disposition: A | Discharge status: Home / Self Care | Payer: Medicare Other | Attending: Internal Medicine | Admitting: Internal Medicine

## 2010-09-16 ENCOUNTER — Emergency Department (HOSPITAL_COMMUNITY): Payer: Medicare Other

## 2010-09-16 DIAGNOSIS — K219 Gastro-esophageal reflux disease without esophagitis: Secondary | ICD-10-CM | POA: Diagnosis present

## 2010-09-16 DIAGNOSIS — R002 Palpitations: Secondary | ICD-10-CM | POA: Diagnosis present

## 2010-09-16 DIAGNOSIS — T451X5A Adverse effect of antineoplastic and immunosuppressive drugs, initial encounter: Secondary | ICD-10-CM | POA: Diagnosis present

## 2010-09-16 DIAGNOSIS — F411 Generalized anxiety disorder: Secondary | ICD-10-CM | POA: Diagnosis present

## 2010-09-16 DIAGNOSIS — E871 Hypo-osmolality and hyponatremia: Principal | ICD-10-CM | POA: Diagnosis present

## 2010-09-16 DIAGNOSIS — D61810 Antineoplastic chemotherapy induced pancytopenia: Secondary | ICD-10-CM | POA: Diagnosis present

## 2010-09-16 DIAGNOSIS — R5381 Other malaise: Secondary | ICD-10-CM | POA: Diagnosis present

## 2010-09-16 DIAGNOSIS — R55 Syncope and collapse: Secondary | ICD-10-CM | POA: Diagnosis present

## 2010-09-16 DIAGNOSIS — C50919 Malignant neoplasm of unspecified site of unspecified female breast: Secondary | ICD-10-CM | POA: Diagnosis present

## 2010-09-16 DIAGNOSIS — Z79899 Other long term (current) drug therapy: Secondary | ICD-10-CM

## 2010-09-16 DIAGNOSIS — R5383 Other fatigue: Secondary | ICD-10-CM | POA: Diagnosis present

## 2010-09-16 LAB — URINALYSIS, ROUTINE W REFLEX MICROSCOPIC
Bilirubin Urine: NEGATIVE
Glucose, UA: NEGATIVE mg/dL
Ketones, ur: NEGATIVE mg/dL
Protein, ur: NEGATIVE mg/dL
pH: 7 (ref 5.0–8.0)

## 2010-09-16 LAB — COMPREHENSIVE METABOLIC PANEL
ALT: 20 U/L (ref 0–35)
Albumin: 3.8 g/dL (ref 3.5–5.2)
Alkaline Phosphatase: 76 U/L (ref 39–117)
Chloride: 88 mEq/L — ABNORMAL LOW (ref 96–112)
Glucose, Bld: 116 mg/dL — ABNORMAL HIGH (ref 70–99)
Potassium: 3.7 mEq/L (ref 3.5–5.1)
Sodium: 120 mEq/L — ABNORMAL LOW (ref 135–145)
Total Bilirubin: 0.3 mg/dL (ref 0.3–1.2)
Total Protein: 6.9 g/dL (ref 6.0–8.3)

## 2010-09-16 LAB — CBC
HCT: 26.4 % — ABNORMAL LOW (ref 36.0–46.0)
Hemoglobin: 9.2 g/dL — ABNORMAL LOW (ref 12.0–15.0)
MCHC: 34.8 g/dL (ref 30.0–36.0)
MCV: 91 fL (ref 78.0–100.0)
RDW: 16.5 % — ABNORMAL HIGH (ref 11.5–15.5)

## 2010-09-16 LAB — CK TOTAL AND CKMB (NOT AT ARMC)
CK, MB: 3.2 ng/mL (ref 0.3–4.0)
Relative Index: 1.5 (ref 0.0–2.5)

## 2010-09-16 LAB — DIFFERENTIAL
Basophils Absolute: 0 10*3/uL (ref 0.0–0.1)
Eosinophils Relative: 0 % (ref 0–5)
Lymphocytes Relative: 44 % (ref 12–46)
Lymphs Abs: 1.1 10*3/uL (ref 0.7–4.0)
Monocytes Absolute: 0.4 10*3/uL (ref 0.1–1.0)
Neutro Abs: 0.9 10*3/uL — ABNORMAL LOW (ref 1.7–7.7)

## 2010-09-17 LAB — CBC
HCT: 25.2 % — ABNORMAL LOW (ref 36.0–46.0)
Hemoglobin: 8.4 g/dL — ABNORMAL LOW (ref 12.0–15.0)
MCV: 94 fL (ref 78.0–100.0)
Platelets: 123 10*3/uL — ABNORMAL LOW (ref 150–400)
RBC: 2.68 MIL/uL — ABNORMAL LOW (ref 3.87–5.11)
WBC: 2.1 10*3/uL — ABNORMAL LOW (ref 4.0–10.5)

## 2010-09-17 LAB — DIFFERENTIAL
Basophils Relative: 1 % (ref 0–1)
Eosinophils Relative: 0 % (ref 0–5)
Lymphocytes Relative: 53 % — ABNORMAL HIGH (ref 12–46)
Monocytes Relative: 21 % — ABNORMAL HIGH (ref 3–12)
Neutro Abs: 0.5 10*3/uL — ABNORMAL LOW (ref 1.7–7.7)

## 2010-09-17 LAB — COMPREHENSIVE METABOLIC PANEL
Albumin: 3.2 g/dL — ABNORMAL LOW (ref 3.5–5.2)
Alkaline Phosphatase: 66 U/L (ref 39–117)
BUN: 7 mg/dL (ref 6–23)
CO2: 26 mEq/L (ref 19–32)
Chloride: 105 mEq/L (ref 96–112)
Potassium: 4.5 mEq/L (ref 3.5–5.1)
Total Bilirubin: 0.4 mg/dL (ref 0.3–1.2)

## 2010-09-17 LAB — OSMOLALITY: Osmolality: 257 mOsm/kg — ABNORMAL LOW (ref 275–300)

## 2010-09-18 LAB — BASIC METABOLIC PANEL
BUN: 7 mg/dL (ref 6–23)
CO2: 26 mEq/L (ref 19–32)
Calcium: 9.5 mg/dL (ref 8.4–10.5)
Chloride: 102 mEq/L (ref 96–112)
Creatinine, Ser: 0.91 mg/dL (ref 0.4–1.2)
GFR calc Af Amer: >60 mL/min (ref >60)
Glucose, Bld: 95 mg/dL (ref 70–99)

## 2010-09-19 LAB — BASIC METABOLIC PANEL
BUN: 10 mg/dL (ref 6–23)
CO2: 29 mEq/L (ref 19–32)
Calcium: 9.9 mg/dL (ref 8.4–10.5)
GFR calc non Af Amer: >60 mL/min (ref >60)
Glucose, Bld: 96 mg/dL (ref 70–99)
Sodium: 132 mEq/L — ABNORMAL LOW (ref 135–145)

## 2010-09-24 ENCOUNTER — Encounter (HOSPITAL_BASED_OUTPATIENT_CLINIC_OR_DEPARTMENT_OTHER): Payer: Medicare Other | Admitting: Oncology

## 2010-09-24 ENCOUNTER — Other Ambulatory Visit: Payer: Self-pay | Admitting: Oncology

## 2010-09-24 DIAGNOSIS — C773 Secondary and unspecified malignant neoplasm of axilla and upper limb lymph nodes: Secondary | ICD-10-CM

## 2010-09-24 DIAGNOSIS — Z5112 Encounter for antineoplastic immunotherapy: Secondary | ICD-10-CM

## 2010-09-24 DIAGNOSIS — Z5111 Encounter for antineoplastic chemotherapy: Secondary | ICD-10-CM

## 2010-09-24 DIAGNOSIS — C50919 Malignant neoplasm of unspecified site of unspecified female breast: Secondary | ICD-10-CM

## 2010-09-24 LAB — CBC WITH DIFFERENTIAL/PLATELET
Basophils Absolute: 0 10*3/uL (ref 0.0–0.1)
EOS%: 0.2 % (ref 0.0–7.0)
Eosinophils Absolute: 0 10*3/uL (ref 0.0–0.5)
LYMPH%: 27.6 % (ref 14.0–49.7)
MCH: 31.8 pg (ref 25.1–34.0)
MCV: 93.9 fL (ref 79.5–101.0)
MONO%: 10.3 % (ref 0.0–14.0)
NEUT#: 2.6 10*3/uL (ref 1.5–6.5)
Platelets: 176 10*3/uL (ref 145–400)
RBC: 3.14 10*6/uL — ABNORMAL LOW (ref 3.70–5.45)
nRBC: 0 % (ref 0–0)

## 2010-09-24 LAB — COMPREHENSIVE METABOLIC PANEL
Alkaline Phosphatase: 86 U/L (ref 39–117)
CO2: 21 mEq/L (ref 19–32)
Creatinine, Ser: 0.77 mg/dL (ref 0.40–1.20)
Glucose, Bld: 129 mg/dL — ABNORMAL HIGH (ref 70–99)
Total Bilirubin: 0.3 mg/dL (ref 0.3–1.2)

## 2010-10-01 ENCOUNTER — Encounter (HOSPITAL_BASED_OUTPATIENT_CLINIC_OR_DEPARTMENT_OTHER): Payer: Medicare Other | Admitting: Oncology

## 2010-10-01 ENCOUNTER — Other Ambulatory Visit: Payer: Self-pay | Admitting: Oncology

## 2010-10-01 DIAGNOSIS — C773 Secondary and unspecified malignant neoplasm of axilla and upper limb lymph nodes: Secondary | ICD-10-CM

## 2010-10-01 DIAGNOSIS — C50919 Malignant neoplasm of unspecified site of unspecified female breast: Secondary | ICD-10-CM

## 2010-10-01 DIAGNOSIS — Z5111 Encounter for antineoplastic chemotherapy: Secondary | ICD-10-CM

## 2010-10-01 LAB — CBC WITH DIFFERENTIAL/PLATELET
Basophils Absolute: 0 10*3/uL (ref 0.0–0.1)
Eosinophils Absolute: 0 10*3/uL (ref 0.0–0.5)
HCT: 28.2 % — ABNORMAL LOW (ref 34.8–46.6)
HGB: 9.4 g/dL — ABNORMAL LOW (ref 11.6–15.9)
LYMPH%: 34 % (ref 14.0–49.7)
MONO#: 0.4 10*3/uL (ref 0.1–0.9)
NEUT#: 2.2 10*3/uL (ref 1.5–6.5)
NEUT%: 55.9 % (ref 38.4–76.8)
Platelets: 201 10*3/uL (ref 145–400)
WBC: 4 10*3/uL (ref 3.9–10.3)
nRBC: 0 % (ref 0–0)

## 2010-10-03 ENCOUNTER — Encounter (HOSPITAL_BASED_OUTPATIENT_CLINIC_OR_DEPARTMENT_OTHER): Payer: Medicare Other | Admitting: Oncology

## 2010-10-03 ENCOUNTER — Other Ambulatory Visit: Payer: Self-pay | Admitting: Oncology

## 2010-10-03 DIAGNOSIS — C50919 Malignant neoplasm of unspecified site of unspecified female breast: Secondary | ICD-10-CM

## 2010-10-03 DIAGNOSIS — Z5111 Encounter for antineoplastic chemotherapy: Secondary | ICD-10-CM

## 2010-10-03 DIAGNOSIS — Z5112 Encounter for antineoplastic immunotherapy: Secondary | ICD-10-CM

## 2010-10-03 LAB — BASIC METABOLIC PANEL
BUN: 15 mg/dL (ref 6–23)
CO2: 22 mEq/L (ref 19–32)
Chloride: 102 mEq/L (ref 96–112)
Creatinine, Ser: 0.85 mg/dL (ref 0.40–1.20)
Glucose, Bld: 157 mg/dL — ABNORMAL HIGH (ref 70–99)

## 2010-10-06 NOTE — H&P (Signed)
Laurie Allen, FALKENSTEIN                 ACCOUNT NO.:  [id number]  MEDICAL RECORD NO.:  [id number]           PATIENT TYPE:  O  LOCATION:  1341                         FACILITY:  [facility]  PHYSICIAN:  Erick Blinks, MD     DATE OF BIRTH:  [birth year]  DATE OF ADMISSION:  09/16/2010 DATE OF DISCHARGE:                             HISTORY & PHYSICAL   PRIMARY CARE PHYSICIAN:  [name]. Laurie Simmers, MD.  ONCOLOGIST:  Drue Second, MD, from the [facility].  CHIEF COMPLAINT:  Weakness.  HISTORY OF PRESENT ILLNESS:  This is a 65 year old African American female with a history of recently diagnosed breast cancer which was diagnosed in December, anxiety, who presents to the emergency room with complaints of generalized weakness.  Apparently, the patient was in her usual state of health when she started to develop some increasing weakness which started mostly today.  She reports an adequate amount of p.o. intake.  Her children report that she does drink a lot of water although they do not quantify exactly how much.  She has been having increasing difficulty to get around secondary to her weakness.  She denies any fever, shortness of breath, cough.  She does feel dizzy on standing.  She denies any diarrhea.  She does have some nausea and vomiting but denies any abdominal pain, and she denies any dysuria.  She has not had any headache, changes in vision, unilateral weakness or numbness.  She called her oncologist and was instructed to come to the emergency room.  She reports that most of her symptoms started after she had taken a dose of melatonin to help her sleep.  In the ER she was noted to be hyponatremic at 120, and has been referred for admission.  PAST MEDICAL HISTORY: 1. Breast cancer. 2. Anxiety.  ALLERGIES:  NO KNOWN DRUG ALLERGIES.  MEDICATIONS PRIOR TO ADMISSION: 1. Optive lubricant eye drops, both eyes 1 drop twice daily. 2. Promethazine rectal suppository 25 mg q.8 h p.r.n. 3. EMLA  cream one application before accessing port. 4. Chemotherapy regimen every Wednesday. 5. Xanax 0.5 mg tablet, half to 1 tablet three times a day as needed. 6. Carafate 1 g p.o. 1 tablet before meals and at bedtime. 7. Zolpidem 5 mg 1 tablet daily at bedtime. 8. Melatonin 5 mg 1 tablet daily as needed. 9. Protonix 1 tablet daily.  SOCIAL HISTORY:  The patient does not smoke.  She does not drink alcohol.  FAMILY HISTORY:  The patient's father had passed away from an unknown type of cancer.  Her mother passed away when she was 2 and she does not know exactly the details of her death.  REVIEW OF SYSTEMS:  All systems have been reviewed and pertinent positives as stated in the HPI.  PHYSICAL EXAM:  VITAL SIGNS:  Temperature of 97.9; heart rate of 97; respiratory rate of 22; initial blood pressure 170/75, currently 123/76; pulse ox of 100% on room air.  GENERAL:  The patient is in no acute distress lying in bed.  HEENT:  Normocephalic, atraumatic.  Pupils are equal, round, reactive to light.  NECK: Supple.  CHEST: Clear to auscultation bilaterally.  HEART:  S1, S2 with a regular rate and rhythm.  ABDOMEN:  Soft, nontender.  Bowel sounds are active. EXTREMITIES:  No cyanosis, clubbing or edema.  PERTINENT LABORATORY AND X-RAY DATA:  Sodium 128, potassium 3.7, chloride 88, bicarb 24, BUN 5, creatinine 0.72, glucose 116, calcium 9.0.  WBC 2.4, hemoglobin 9.2, platelets 122.  Chest x-ray shows no acute disease.  Her sodium was also checked on March 15 and was found to be low at 125, it has since trended down.  ASSESSMENT: 1. Hyponatremia. 2. Generalized weakness. 3. Nausea. 4. Pancytopenia secondary to recent chemotherapy. 5. History of breast cancer, currently on chemotherapy. 6. Anxiety.  PLAN: 1. We will admit the patient to a regular bed.  For hyponatremia, will     continue with IV saline.  The patient has received some saline in     the emergency room and reports feeling  significantly better.  We     will also check orthostatics on admission to rule out volume     depletion.  Etiologies of her hyponatremia are multiple at this     time.  She does drink excessive water and it could be secondary to     a primary polydipsia.  She also does have an underlying     malignancy and this could be secondary to a SIADH picture as well.     We will check a serum osmolarity as well as urine osmolarity and     urine sodium.  We will also check a TSH and random cortisol.  We     will continue her on saline right now since she has had symptomatic     improvement and further adjustments of fluid and/or fluid     restriction can be done once we have more data back. 2. Pancytopenia secondary to chemotherapy:  Her hemoglobin is stable     at this time, she does not require a transfusion. 3. History of breast cancer, currently on chemotherapy.  I have left a     message for Dr. Milta Deiters office to make her aware that the patient is     admitted here in the hospital. 4. Gastroesophageal reflux disease:  We will continue her on     outpatient medication. 5. Anxiety:  We will also continue her outpatient medication.  CODE STATUS:  The patient is a Full Code.  Further orders will be per the clinical course.     Erick Blinks, MD     JM/MEDQ  D:  09/16/2010  T:  09/16/2010  Job:  [id number]  cc:   Drue Second, M.D. Fax: [phone]  Electronically Signed by Erick Blinks  on 10/06/2010 03:52:36 PM

## 2010-10-07 NOTE — Discharge Summary (Signed)
Laurie Allen, Laurie Allen                 ACCOUNT NO.:  [id number]  MEDICAL RECORD NO.:  [id number]           PATIENT TYPE:  I  LOCATION:  1433                         FACILITY[facility]  [facility]  PHYSICIAN:  Altha Harm, MDDATE OF BIRTH:  1946/03/13  DATE OF ADMISSION:  09/16/2010 DATE OF DISCHARGE:  09/19/2010                              DISCHARGE SUMMARY   DISCHARGE DISPOSITION:  Home.  FINAL DISCHARGE DIAGNOSES: 1. Near syncope - felt likely to be combination of mild dehydration     and hyponatremia. 2. Hyponatremia, most likely related to carboplatin effect, now     resolved. 3. Palpitations, multifactorial anxiety, dehydration, and     hyponatremia. 4. Mild dehydration, resolved. 5. Breast cancer, currently under treatment with [facility] therapy. 6. Gastroesophageal reflux stable.  DISCHARGE MEDICATIONS:  Include the following: 1. Magnesium oxide 400 mg p.o. q.h.s. 2. Carafate 1 gm p.o. a.c. and h.s. 3. Chemotherapy per Dr. Welton Flakes with Taxol, Carboplatin, and Herceptin     therapy. 4. EMLA application before accessing port. 5. Optive Lubricant eye drop, 1 drop to both eyes daily. 6. Phenergan rectal suppository 25 mg rectally q.8h. p.r.n. as needed     on the day of and after chemo as needed for nausea. 7. Protonix 40 mg p.o. daily. 8. Xanax 0.25 to 0.5 mg p.o. t.i.d. p.r.n. anxiety. 9. Ambien 5 mg p.o. q.h.s. p.r.n. insomnia.  DISCONTINUED MEDICATIONS:  Melatonin 5 mg p.o. daily as needed.  CONSULTANTS:  None.  PROCEDURES:  None.  DIAGNOSTIC STUDIES:  Chest x-ray two-view which shows a stable examination and no active cardiopulmonary process.  Review of diagnostic studies, patient had a 2-D echocardiogram in June 18, 2010, which shows normal left ventricular systolic function with an ejection fraction of 60% to 65% and no wall motion abnormalities.  There is a mildly dilated right ventricle on the apical images with normal size on the subcostal image.  No evidence of  pulmonary hypertension and the wall thickness was normal at that time.  A 2-D echocardiogram was not repeated on this admission.  PRIMARY CARE PHYSICIAN:  Renaye Rakers, M.D.  ONCOLOGIST:  Drue Second, M.D.  CODE STATUS:  Full code.  ALLERGIES:  No known drug allergies.  CHIEF COMPLAINT:  Weakness.  HISTORY OF PRESENT ILLNESS:  Please refer to the H&P by Dr. Kerry Hough for details of the HPI; however, in short, this is a 65 year old African- American female with a history of breast cancer, currently undergoing weekly chemotherapy with a THC regimen.  The patient presented to the Emergency Room with complaints of generalized weakness.  She also stated that at that time she was having palpitations and felt like she was going to pass out.  Upon evaluation in the Emergency Room, the patient was found to have a sodium of 120 and review of her records revealed that the patient had low sodiums in the past.  The patient was referred to the [facility] for further evaluation and management.  HOSPITAL COURSE: 1. WEAKNESS:  The patient's weakness was felt to be multifactorial     with the major contributing factor being her profound hyponatremia  of 120.  The patient was given IV fluids for rehydration and she     had a quick reversal of her hyponatremia, going from 120 to 135     with IV fluids.  I do not believe that this is an SIADH picture     based upon the urine osmolality; however, in reviewing the     patient's medications, the patient is on carboplatin which is an     agent known to cause hyponatremia.  Once the IV fluids were     discontinued, the patient started to have some trending down of her     sodium in the phase of normal renal function.  I spoke with Dr.     Drue Second by phone and we discussed a plan for the patient.  The     patient will have her electrolytes checked on a weekly basis to     assess for hyponatremia occurring.  If in fact the patient does      show any signs of this, she will be given supplemental sodium in     the form of sodium bicarb tablets.  Additionally, I have asked the     patient who has been on a salt-restricted diet to liberalize her     salt intake.  I do not believe that there is anymore involvement in     this as the patient has normal kidney function and her serum sodium     corrected with the addition of IV fluid rather than with fluid     restrictions.  Additionally, her urine sodium is not consistent     with diagnosis of SIADH and the patient is euvolemic at this point. 2. HYPONATREMIA:  See above. 3. NEAR-SYNCOPE:  As noted, I felt that the patient's near-syncope was     secondary to multiple factors including mild dehydration,     hyponatremia, and some anxiety.  The patient did complain of     palpitations and was monitored on telemetry monitor which showed no     arrhythmias, and she was in normal sinus rhythm with heart rate in     the 70s for the entire time that she was monitored. 4. BREAST CANCER:  The patient does have a history of breast cancer     and is undergoing chemotherapy under the care of Dr. Drue Second     with Taxol, carboplatin, and Herceptin.  I have discussed this with     Dr. Welton Flakes.  The patient has had a very good response to her therapy,     thus in order to maintain her on this therapy to which she is very     responsive.  She will monitor her electrolytes on a weekly basis     and make adjustments for any hyponatremia. 5. Gastroesophageal reflux disease, the patient is continued on her     usual Carafate and Protonix.  CONDITION ON DISCHARGE:  Condition at the time of discharge is stable.  PHYSICAL EXAMINATION:  As follows: GENERAL:  The patient is well-appearing.  She is euvolemic without any dizziness and tolerating diet well. VITAL SIGNS:  Her vital signs as follows; temperature is 97.5, heart rate is 80, respiratory rate 18, blood pressure 115/67, O2 sats are 97% on room  air. HEENT EXAMINATION:  She is normocephalic, atraumatic.  Pupils are equally round and reactive to light and accommodation.  Extraocular movements are intact.  Oropharynx is moist.  No exudate, erythema, or lesions  are noted. NECK EXAMINATION:  Her trachea is midline.  No masses, no thyromegaly, no JVD, no carotid bruit. RESPIRATORY EXAMINATION:  She has a normal respiratory effort, equal excursion bilaterally.  No wheezing or rhonchi noted. CARDIOVASCULAR:  She has got a normal S1 and S2.  No murmurs, rubs, or gallops are noted. ABDOMEN:  Obese, soft, nontender, nondistended.  No masses.  No hepatosplenomegaly. EXTREMITIES:  Showed no clubbing, cyanosis, or edema. NEUROLOGIC:  Neurologically, the patient has no focal neurological deficits.  DTRs are 2+ bilaterally in the upper and lower extremities and the patient moves all extremities against gravity with 4+/5 strength in bilateral upper and bilateral lower extremities. PSYCHIATRIC:  She is alert and oriented x3.  Good affect.  Good insight and cognition.  DIETARY RESTRICTIONS:  The patient should be on regular diet without any salt restriction.  PHYSICAL RESTRICTIONS:  None.  DISPOSITION:  The patient is being discharged home.  DISCHARGE INSTRUCTIONS:  She is to follow up with her primary care physician, Dr. Renaye Rakers within 1 week and to follow up with Dr. Drue Second as scheduled to resume her chemotherapy.  Followup labs, patient should have her electrolytes checked within the next 5 to 7 days.  Total time to coordinate this discharge including face-to-face time 40 minutes.     Altha Harm, MD     MAM/MEDQ  D:  09/19/2010  T:  09/19/2010  Job:  [id number]  cc:   Drue Second, M.D. Fax: [phone]  Renaye Rakers, M.D. Fax: [phone]  Electronically Signed by Marthann Schiller MD on 10/07/2010 08:25:53 PM

## 2010-10-08 ENCOUNTER — Other Ambulatory Visit: Payer: Self-pay | Admitting: Oncology

## 2010-10-08 ENCOUNTER — Encounter (HOSPITAL_BASED_OUTPATIENT_CLINIC_OR_DEPARTMENT_OTHER): Payer: Medicare Other | Admitting: Oncology

## 2010-10-08 DIAGNOSIS — C50919 Malignant neoplasm of unspecified site of unspecified female breast: Secondary | ICD-10-CM

## 2010-10-08 DIAGNOSIS — Z5111 Encounter for antineoplastic chemotherapy: Secondary | ICD-10-CM

## 2010-10-08 LAB — BASIC METABOLIC PANEL
BUN: 12 mg/dL (ref 6–23)
Creatinine, Ser: 0.82 mg/dL (ref 0.40–1.20)
Potassium: 4.3 mEq/L (ref 3.5–5.3)

## 2010-10-08 LAB — CBC WITH DIFFERENTIAL/PLATELET
Basophils Absolute: 0 10*3/uL (ref 0.0–0.1)
EOS%: 0.3 % (ref 0.0–7.0)
HGB: 9.9 g/dL — ABNORMAL LOW (ref 11.6–15.9)
MCH: 34 pg (ref 25.1–34.0)
MCV: 98.5 fL (ref 79.5–101.0)
MONO%: 3.7 % (ref 0.0–14.0)
RDW: 17.9 % — ABNORMAL HIGH (ref 11.2–14.5)

## 2010-10-15 ENCOUNTER — Inpatient Hospital Stay (HOSPITAL_COMMUNITY)
Admission: EM | Admit: 2010-10-15 | Discharge: 2010-10-16 | DRG: 641 | Disposition: A | Discharge status: Home / Self Care | Payer: Medicare Other | Attending: Internal Medicine | Admitting: Internal Medicine

## 2010-10-15 DIAGNOSIS — R5381 Other malaise: Secondary | ICD-10-CM | POA: Diagnosis present

## 2010-10-15 DIAGNOSIS — F411 Generalized anxiety disorder: Secondary | ICD-10-CM | POA: Diagnosis present

## 2010-10-15 DIAGNOSIS — D630 Anemia in neoplastic disease: Secondary | ICD-10-CM | POA: Diagnosis present

## 2010-10-15 DIAGNOSIS — K219 Gastro-esophageal reflux disease without esophagitis: Secondary | ICD-10-CM | POA: Diagnosis present

## 2010-10-15 DIAGNOSIS — C50919 Malignant neoplasm of unspecified site of unspecified female breast: Secondary | ICD-10-CM | POA: Diagnosis present

## 2010-10-15 DIAGNOSIS — E871 Hypo-osmolality and hyponatremia: Principal | ICD-10-CM | POA: Diagnosis present

## 2010-10-15 LAB — CBC
HCT: 26 % — ABNORMAL LOW (ref 36.0–46.0)
MCHC: 33.8 g/dL (ref 30.0–36.0)
MCV: 93.2 fL (ref 78.0–100.0)
RDW: 15.5 % (ref 11.5–15.5)

## 2010-10-15 LAB — CARDIAC PANEL(CRET KIN+CKTOT+MB+TROPI)
CK, MB: 2 ng/mL (ref 0.3–4.0)
Relative Index: 1.6 (ref 0.0–2.5)
Relative Index: 1.4 (ref 0.0–2.5)
Total CK: 169 U/L (ref 7–177)
Troponin I: 0.01 ng/mL (ref 0.00–0.06)
Troponin I: 0.01 ng/mL (ref 0.00–0.06)
Troponin I: <0.01 ng/mL (ref 0.00–0.06)

## 2010-10-15 LAB — BASIC METABOLIC PANEL
BUN: 9 mg/dL (ref 6–23)
Chloride: 99 mEq/L (ref 96–112)
Creatinine, Ser: 0.81 mg/dL (ref 0.4–1.2)
Glucose, Bld: 125 mg/dL — ABNORMAL HIGH (ref 70–99)

## 2010-10-15 LAB — DIFFERENTIAL
Basophils Relative: 0 % (ref 0–1)
Eosinophils Absolute: 0 10*3/uL (ref 0.0–0.7)
Lymphs Abs: 1.3 10*3/uL (ref 0.7–4.0)
Monocytes Absolute: 0.7 10*3/uL (ref 0.1–1.0)
Neutro Abs: 7 10*3/uL (ref 1.7–7.7)
Neutrophils Relative %: 78 % — ABNORMAL HIGH (ref 43–77)

## 2010-10-15 LAB — COMPREHENSIVE METABOLIC PANEL
Alkaline Phosphatase: 91 U/L (ref 39–117)
BUN: 7 mg/dL (ref 6–23)
Calcium: 9 mg/dL (ref 8.4–10.5)
Glucose, Bld: 106 mg/dL — ABNORMAL HIGH (ref 70–99)
Total Protein: 6.3 g/dL (ref 6.0–8.3)

## 2010-10-15 LAB — POCT CARDIAC MARKERS: Myoglobin, poc: 116 ng/mL (ref 12–200)

## 2010-10-15 LAB — TSH: TSH: 1.158 u[IU]/mL (ref 0.350–4.500)

## 2010-10-16 LAB — BASIC METABOLIC PANEL
Calcium: 9.2 mg/dL (ref 8.4–10.5)
Chloride: 111 mEq/L (ref 96–112)
Creatinine, Ser: 0.72 mg/dL (ref 0.4–1.2)
GFR calc Af Amer: >60 mL/min (ref >60)
GFR calc non Af Amer: >60 mL/min (ref >60)

## 2010-10-16 LAB — MAGNESIUM: Magnesium: 1.6 mg/dL (ref 1.5–2.5)

## 2010-10-17 NOTE — H&P (Signed)
Laurie Allen, Laurie Allen                 ACCOUNT NO.:  [id number]  MEDICAL RECORD NO.:  [id number]           PATIENT TYPE:  E  LOCATION:  WLED                         FACILITY:  [facility]  PHYSICIAN:  Houston Siren, MD           DATE OF BIRTH:  Feb 28, 1946  DATE OF ADMISSION:  10/15/2010 DATE OF DISCHARGE:                             HISTORY & PHYSICAL   PRIMARY CARE PHYSICIAN:  Renaye Rakers, M.D.  ONCOLOGIST:  Drue Second, M.D.  REASON FOR ADMISSION:  Hyponatremia.  ADVANCE DIRECTIVE:  Full code.  HISTORY OF PRESENT ILLNESS:  This is a 65 year old female with a history of recently diagnosed right breast cancer undergoing chemotherapy, history of hyponatremia, GERD, presented to the emergency room complaining of palpitations.  She has a normal EKG and no abnormal rhythm found.  She had no lightheadedness, shortness of breath, or chest pain.  Evaluation in the emergency room, however, found that she has serum sodium of 121 and since she had been feeling weak, she was admitted.  It should be noted that her last admission in March 2012 was for a similar reason with her serum sodium at that time 120 and laboratory study argued against SIADH.  She was drinking quite a bit of water, and it was felt that that may be the reason at that time.  She was recommended to the liberalize her salt restriction, and if necessary to take sodium chloride tablets, however, she said that she had a hard time finding the tablet at local pharmacies.  REVIEW OF SYSTEMS:  Otherwise unremarkable.  PAST MEDICAL HISTORY:  Anxiety, breast cancer, acid reflux.  PAST SURGICAL HISTORY:  Port-A-Cath placement.  SOCIAL HISTORY:  She lives with her spouse.  She denied tobacco, alcohol, or drug use.  ALLERGIES:  No known drug allergies.  CURRENT MEDICATIONS:  Carafate, dexamethasone, question of dosing Phenergan, magnesium oxide, Xanax, zolpidem, Protonix.  FAMILY HISTORY:  Noncontributory.  PHYSICAL EXAMINATION:   VITAL SIGNS:  Blood pressure is 140/80, pulse of 80, respiratory rate of 18, temperature 97.7. GENERAL:  Shows that she is alert and oriented and is in no apparent distress. HEENT:  She has facial symmetry and fluent speech.  Tongue is midline. Uvula elevated with phonation.  Throat is clear. NECK:  Supple. CARDIAC:  Revealed S1 and S2 regular.  There is a soft 2/6 systolic ejection murmur at the left sternal border. LUNGS:  Clear.  No evidence of consolidation.  She does have a right Port-A-Cath on upper chest pain. ABDOMEN:  Soft, nondistended, nontender. EXTREMITIES:  No edema.  No calf tenderness.  There is no localized weakness. NEUROLOGIC:  Unremarkable. PSYCHIATRIC:  Unremarkable.  OBJECTIVE FINDINGS:  Serum sodium 121, potassium 3.4, creatinine 0.72, blood glucose of 106.  White count of 9000, hemoglobin of 8.8, MCV of 93.  EKG showed normal sinus rhythm without any acute ST-T changes. Troponin less than 0.05.  IMPRESSION:  This is a 65 year old female with history of breast cancer, gastroesophageal reflux disease, prior hyponatremia, presents with palpitations but was found to be in normal sinus rhythm.  Workup, however, shows that she  has hyponatremia and she was admitted for this reason.  A recent admission workup did not suggest that this was SIADH. It did respond to IV fluids, and thus I will start her on normal saline with potassium supplement.  Should consider the demeclocycline to correct serum sodium, if it continues to be a problem.  Because of her chemotherapy, we will check an echo of her heart as well.  Note that cisplatin can cause hyponatremia.  I will continue her other medications She is a full code, will be admit to [facility] IV.  I do not know the status of her dexamethasone, and she does not need stress steroids at this time.  She is stable and will be ruled out.  We will check her TSH for the palpitation as well.     Houston Siren,  MD     PL/MEDQ  D:  10/15/2010  T:  10/15/2010  Job:  [id number]  cc:   Renaye Rakers, M.D. Fax: [phone]  Electronically Signed by Houston Siren  on 10/17/2010 04:18:11 AM

## 2010-10-18 NOTE — Discharge Summary (Signed)
NAMEARTEMIS, Allen                 ACCOUNT NO.:  [id number]  MEDICAL RECORD NO.:  [id number]           PATIENT TYPE:  I  LOCATION:  1437                         FACILITY:  [facility]  PHYSICIAN:  Kathlen Mody, MD       DATE OF BIRTH:  July 13, 1945  DATE OF ADMISSION:  10/15/2010 DATE OF DISCHARGE:  10/16/2010                              DISCHARGE SUMMARY   DISCHARGE DIAGNOSES: 1. Hyponatremia. 2. Breast cancer. 3. Gastroesophageal reflux disease. 4. Anxiety.  DISCHARGE MEDICATIONS:  Same medications as on admission.  PERTINENT LABS:  On admission, patient had a comprehensive metabolic panel showed a sodium of 121, potassium of 3.4.  She will be placed on glucose of 106.  Rest of the comprehensive metabolic panel was within normal limits.  CBC significant for the hemoglobin of 8.8, hematocrit of 26, platelets of 144,000.  TSH was within normal limits.  Basic metabolic panel done on the evening of October 15, 2010 showed a sodium of 131, potassium of 4.5.  On the day of discharge, patient's basic metabolic panel showed a sodium of 141, potassium of 4.2.  Rest of the basic metabolic panel was within normal limits.  RADIOLOGY:  Patient did not have radiology workup done.  CONSULTS:  None.  PROCEDURES:  None.  BRIEF HOSPITAL COURSE:  [age]-year-old lady with history of recently diagnosed breast cancer, undergoing chemotherapy, last chemo on the first week of April on cisplatin and history of hyponatremia and GERD, presented to ER with complaints of palpitations and on admission she was found to have a sodium of 121 and was also complaining of generalized weakness and she was found to be dehydrated.  Patient did report that she was drinking a lot of water at the that time.  She was given a prescription for some tablets, which she was recommended to take these all tablets after chemotherapy, but she could not find Pharmacy where she could get these all tablets.  While in the ER,  patient was started on IV fluids, normal saline at 100 mL/hour and the next basic metabolic panel done 8 hours later showed sodium of 131 and patient's symptoms have dramatically improved.  Over the next week, patient's symptoms have resolved.  Sodium normalized.  GERD:  She was continued on her home dose of Protonix and Maalox.  Anxiety:  She was continued on her Xanax.  Breast cancer:  Patient received cisplatin chemotherapy for her breast cancer and she had a recent echocardiogram in June, which showed good systolic function.  She was recommended to get a repeat echocardiogram as outpatient in about 6 months.  DISCHARGE PHYSICAL EXAMINATION:  VITAL SIGNS:  On the day of discharge, patient's vitals include temperature of 98, pulse of 71, blood pressure 117/73, respiration 16, saturating 100% on room air. GENERAL:  She is alert, afebrile, oriented x3, comfortable. CARDIOVASCULAR EXAMINATION:  S1, S2 heard. RESPIRATORY EXAMINATION:  Good air entry bilateral. ABDOMEN:  Soft, nontender, nondistended.  Bowel sounds are heard. EXTREMITIES:  No pedal edema. NEUROLOGICAL EXAMINATION:  Nonfocal.  Denied any complaints of headache, blurred vision or any weakness, tingling or numbness.  Patient  denies any complaints.  FOLLOWUP:  Patient was recommended to follow up with her PCP in about 1 week and recommended to get a repeat basic metabolic panel to check her sodium and potassium levels.  Patient was also recommended to follow up with Dr. Welton Flakes in about 1-2 weeks.          ______________________________ Kathlen Mody, MD     VA/MEDQ  D:  10/16/2010  T:  10/17/2010  Job:  [id number]  Electronically Signed by Kathlen Mody MD on 10/18/2010 02:19:09 PM

## 2010-10-23 ENCOUNTER — Encounter (HOSPITAL_BASED_OUTPATIENT_CLINIC_OR_DEPARTMENT_OTHER): Payer: Medicare Other | Admitting: Oncology

## 2010-10-23 ENCOUNTER — Other Ambulatory Visit: Payer: Self-pay | Admitting: Oncology

## 2010-10-23 DIAGNOSIS — C50919 Malignant neoplasm of unspecified site of unspecified female breast: Secondary | ICD-10-CM

## 2010-10-23 LAB — COMPREHENSIVE METABOLIC PANEL
ALT: 25 U/L (ref 0–35)
AST: 24 U/L (ref 0–37)
CO2: 26 mEq/L (ref 19–32)
Calcium: 9.8 mg/dL (ref 8.4–10.5)
Chloride: 102 mEq/L (ref 96–112)
Potassium: 3.8 mEq/L (ref 3.5–5.3)
Sodium: 139 mEq/L (ref 135–145)
Total Protein: 6.7 g/dL (ref 6.0–8.3)

## 2010-10-23 LAB — CBC WITH DIFFERENTIAL/PLATELET
BASO%: 0.8 % (ref 0.0–2.0)
EOS%: 0.3 % (ref 0.0–7.0)
HCT: 27.4 % — ABNORMAL LOW (ref 34.8–46.6)
MCH: 34.4 pg — ABNORMAL HIGH (ref 25.1–34.0)
MCHC: 34 g/dL (ref 31.5–36.0)
MONO#: 0.5 10*3/uL (ref 0.1–0.9)
RBC: 2.7 10*6/uL — ABNORMAL LOW (ref 3.70–5.45)
RDW: 17.9 % — ABNORMAL HIGH (ref 11.2–14.5)
WBC: 3.6 10*3/uL — ABNORMAL LOW (ref 3.9–10.3)
lymph#: 1.2 10*3/uL (ref 0.9–3.3)

## 2010-10-24 ENCOUNTER — Encounter (HOSPITAL_BASED_OUTPATIENT_CLINIC_OR_DEPARTMENT_OTHER): Payer: Medicare Other | Admitting: Oncology

## 2010-10-24 DIAGNOSIS — C50919 Malignant neoplasm of unspecified site of unspecified female breast: Secondary | ICD-10-CM

## 2010-10-24 DIAGNOSIS — Z5111 Encounter for antineoplastic chemotherapy: Secondary | ICD-10-CM

## 2010-10-25 ENCOUNTER — Encounter (HOSPITAL_BASED_OUTPATIENT_CLINIC_OR_DEPARTMENT_OTHER): Payer: Medicare Other | Admitting: Oncology

## 2010-10-25 DIAGNOSIS — C50919 Malignant neoplasm of unspecified site of unspecified female breast: Secondary | ICD-10-CM

## 2010-10-26 ENCOUNTER — Emergency Department (HOSPITAL_COMMUNITY)
Admission: EM | Admit: 2010-10-26 | Discharge: 2010-10-26 | Disposition: A | Discharge status: Home / Self Care | Payer: Medicare Other | Attending: Emergency Medicine | Admitting: Emergency Medicine

## 2010-10-26 ENCOUNTER — Emergency Department (HOSPITAL_COMMUNITY): Payer: Medicare Other

## 2010-10-26 DIAGNOSIS — R569 Unspecified convulsions: Secondary | ICD-10-CM | POA: Insufficient documentation

## 2010-10-26 DIAGNOSIS — R112 Nausea with vomiting, unspecified: Secondary | ICD-10-CM | POA: Insufficient documentation

## 2010-10-26 DIAGNOSIS — C50919 Malignant neoplasm of unspecified site of unspecified female breast: Secondary | ICD-10-CM | POA: Insufficient documentation

## 2010-10-26 DIAGNOSIS — Z9221 Personal history of antineoplastic chemotherapy: Secondary | ICD-10-CM | POA: Insufficient documentation

## 2010-10-26 DIAGNOSIS — F411 Generalized anxiety disorder: Secondary | ICD-10-CM | POA: Insufficient documentation

## 2010-10-26 DIAGNOSIS — R42 Dizziness and giddiness: Secondary | ICD-10-CM | POA: Insufficient documentation

## 2010-10-26 LAB — URINALYSIS, ROUTINE W REFLEX MICROSCOPIC
Glucose, UA: NEGATIVE mg/dL
Ketones, ur: NEGATIVE mg/dL
Protein, ur: NEGATIVE mg/dL
Urobilinogen, UA: 0.2 mg/dL (ref 0.0–1.0)

## 2010-10-26 LAB — POCT I-STAT, CHEM 8
BUN: 15 mg/dL (ref 6–23)
Calcium, Ion: 1.08 mmol/L — ABNORMAL LOW (ref 1.12–1.32)
Creatinine, Ser: 0.9 mg/dL (ref 0.4–1.2)
Hemoglobin: 9.9 g/dL — ABNORMAL LOW (ref 12.0–15.0)
Sodium: 133 mEq/L — ABNORMAL LOW (ref 135–145)
TCO2: 24 mmol/L (ref 0–100)

## 2010-10-26 LAB — URINE MICROSCOPIC-ADD ON

## 2010-10-27 LAB — URINE CULTURE: Culture  Setup Time: 201204291810

## 2010-10-29 ENCOUNTER — Other Ambulatory Visit: Payer: Self-pay | Admitting: Oncology

## 2010-10-29 ENCOUNTER — Encounter (HOSPITAL_BASED_OUTPATIENT_CLINIC_OR_DEPARTMENT_OTHER): Payer: Medicare Other | Admitting: Oncology

## 2010-10-29 DIAGNOSIS — C50919 Malignant neoplasm of unspecified site of unspecified female breast: Secondary | ICD-10-CM

## 2010-10-29 LAB — CBC WITH DIFFERENTIAL/PLATELET
BASO%: 1.2 % (ref 0.0–2.0)
Eosinophils Absolute: 0 10*3/uL (ref 0.0–0.5)
MCHC: 33.7 g/dL (ref 31.5–36.0)
MONO#: 0.1 10*3/uL (ref 0.1–0.9)
NEUT#: 2.1 10*3/uL (ref 1.5–6.5)
RBC: 2.63 10*6/uL — ABNORMAL LOW (ref 3.70–5.45)
WBC: 3.2 10*3/uL — ABNORMAL LOW (ref 3.9–10.3)
lymph#: 0.9 10*3/uL (ref 0.9–3.3)
nRBC: 0 % (ref 0–0)

## 2010-10-29 LAB — BASIC METABOLIC PANEL
Chloride: 101 mEq/L (ref 96–112)
Potassium: 4.4 mEq/L (ref 3.5–5.3)
Sodium: 135 mEq/L (ref 135–145)

## 2010-10-29 LAB — TECHNOLOGIST REVIEW

## 2010-11-13 ENCOUNTER — Other Ambulatory Visit: Payer: Self-pay | Admitting: Oncology

## 2010-11-13 ENCOUNTER — Encounter (HOSPITAL_BASED_OUTPATIENT_CLINIC_OR_DEPARTMENT_OTHER): Payer: Medicare Other | Admitting: Oncology

## 2010-11-13 DIAGNOSIS — C50919 Malignant neoplasm of unspecified site of unspecified female breast: Secondary | ICD-10-CM

## 2010-11-13 LAB — CBC WITH DIFFERENTIAL/PLATELET
Basophils Absolute: 0 10*3/uL (ref 0.0–0.1)
Eosinophils Absolute: 0 10*3/uL (ref 0.0–0.5)
HCT: 24.6 % — ABNORMAL LOW (ref 34.8–46.6)
HGB: 8.3 g/dL — ABNORMAL LOW (ref 11.6–15.9)
LYMPH%: 22.7 % (ref 14.0–49.7)
MONO#: 0.3 10*3/uL (ref 0.1–0.9)
NEUT#: 2.7 10*3/uL (ref 1.5–6.5)
NEUT%: 69.6 % (ref 38.4–76.8)
Platelets: 96 10*3/uL — ABNORMAL LOW (ref 145–400)
RBC: 2.35 10*6/uL — ABNORMAL LOW (ref 3.70–5.45)
WBC: 3.9 10*3/uL (ref 3.9–10.3)
lymph#: 0.9 10*3/uL (ref 0.9–3.3)

## 2010-11-13 LAB — COMPREHENSIVE METABOLIC PANEL
Albumin: 3.9 g/dL (ref 3.5–5.2)
BUN: 10 mg/dL (ref 6–23)
CO2: 23 mEq/L (ref 19–32)
Chloride: 104 mEq/L (ref 96–112)
Glucose, Bld: 97 mg/dL (ref 70–99)
Potassium: 4.2 mEq/L (ref 3.5–5.3)
Sodium: 140 mEq/L (ref 135–145)
Total Bilirubin: 0.4 mg/dL (ref 0.3–1.2)
Total Protein: 6.1 g/dL (ref 6.0–8.3)

## 2010-11-14 ENCOUNTER — Encounter (HOSPITAL_BASED_OUTPATIENT_CLINIC_OR_DEPARTMENT_OTHER): Payer: Medicare Other | Admitting: Oncology

## 2010-11-14 DIAGNOSIS — C50919 Malignant neoplasm of unspecified site of unspecified female breast: Secondary | ICD-10-CM

## 2010-11-14 DIAGNOSIS — Z5112 Encounter for antineoplastic immunotherapy: Secondary | ICD-10-CM

## 2010-11-15 ENCOUNTER — Encounter: Payer: Medicare Other | Admitting: Oncology

## 2010-11-20 ENCOUNTER — Other Ambulatory Visit: Payer: Self-pay | Admitting: Oncology

## 2010-11-20 ENCOUNTER — Encounter (HOSPITAL_BASED_OUTPATIENT_CLINIC_OR_DEPARTMENT_OTHER): Payer: Medicare Other | Admitting: Oncology

## 2010-11-20 DIAGNOSIS — C50519 Malignant neoplasm of lower-outer quadrant of unspecified female breast: Secondary | ICD-10-CM

## 2010-11-20 DIAGNOSIS — Z171 Estrogen receptor negative status [ER-]: Secondary | ICD-10-CM

## 2010-11-20 DIAGNOSIS — C50919 Malignant neoplasm of unspecified site of unspecified female breast: Secondary | ICD-10-CM

## 2010-11-20 DIAGNOSIS — C50911 Malignant neoplasm of unspecified site of right female breast: Secondary | ICD-10-CM

## 2010-11-20 LAB — BASIC METABOLIC PANEL
CO2: 23 mEq/L (ref 19–32)
Glucose, Bld: 87 mg/dL (ref 70–99)
Potassium: 4.6 mEq/L (ref 3.5–5.3)
Sodium: 143 mEq/L (ref 135–145)

## 2010-11-20 LAB — CBC WITH DIFFERENTIAL/PLATELET
Eosinophils Absolute: 0 10*3/uL (ref 0.0–0.5)
LYMPH%: 42.6 % (ref 14.0–49.7)
MONO#: 0.3 10*3/uL (ref 0.1–0.9)
NEUT#: 1.2 10*3/uL — ABNORMAL LOW (ref 1.5–6.5)
Platelets: 246 10*3/uL (ref 145–400)
RBC: 2.47 10*6/uL — ABNORMAL LOW (ref 3.70–5.45)
RDW: 18.1 % — ABNORMAL HIGH (ref 11.2–14.5)
WBC: 2.7 10*3/uL — ABNORMAL LOW (ref 3.9–10.3)

## 2010-12-02 ENCOUNTER — Encounter (INDEPENDENT_AMBULATORY_CARE_PROVIDER_SITE_OTHER): Payer: Self-pay | Admitting: Surgery

## 2010-12-02 ENCOUNTER — Other Ambulatory Visit: Payer: Self-pay | Admitting: Surgery

## 2010-12-02 DIAGNOSIS — C50911 Malignant neoplasm of unspecified site of right female breast: Secondary | ICD-10-CM

## 2010-12-04 ENCOUNTER — Other Ambulatory Visit: Payer: Self-pay | Admitting: Oncology

## 2010-12-04 ENCOUNTER — Encounter (HOSPITAL_BASED_OUTPATIENT_CLINIC_OR_DEPARTMENT_OTHER): Payer: Medicare Other | Admitting: Oncology

## 2010-12-04 DIAGNOSIS — C50919 Malignant neoplasm of unspecified site of unspecified female breast: Secondary | ICD-10-CM

## 2010-12-04 LAB — CBC WITH DIFFERENTIAL/PLATELET
BASO%: 0.5 % (ref 0.0–2.0)
EOS%: 0.5 % (ref 0.0–7.0)
HCT: 28.3 % — ABNORMAL LOW (ref 34.8–46.6)
LYMPH%: 15.9 % (ref 14.0–49.7)
MCH: 35.2 pg — ABNORMAL HIGH (ref 25.1–34.0)
MCHC: 34.5 g/dL (ref 31.5–36.0)
MCV: 102 fL — ABNORMAL HIGH (ref 79.5–101.0)
NEUT%: 79.5 % — ABNORMAL HIGH (ref 38.4–76.8)
Platelets: 221 10*3/uL (ref 145–400)

## 2010-12-04 LAB — COMPREHENSIVE METABOLIC PANEL
ALT: 15 U/L (ref 0–35)
AST: 21 U/L (ref 0–37)
CO2: 27 mEq/L (ref 19–32)
Creatinine, Ser: 1.08 mg/dL (ref 0.50–1.10)
Total Bilirubin: 0.3 mg/dL (ref 0.3–1.2)

## 2010-12-04 LAB — MAGNESIUM: Magnesium: 1.5 mg/dL (ref 1.5–2.5)

## 2010-12-05 ENCOUNTER — Encounter (HOSPITAL_BASED_OUTPATIENT_CLINIC_OR_DEPARTMENT_OTHER): Payer: Medicare Other | Admitting: Oncology

## 2010-12-05 DIAGNOSIS — C50919 Malignant neoplasm of unspecified site of unspecified female breast: Secondary | ICD-10-CM

## 2010-12-05 DIAGNOSIS — Z5111 Encounter for antineoplastic chemotherapy: Secondary | ICD-10-CM

## 2010-12-06 ENCOUNTER — Encounter (HOSPITAL_BASED_OUTPATIENT_CLINIC_OR_DEPARTMENT_OTHER): Payer: Medicare Other | Admitting: Oncology

## 2010-12-06 DIAGNOSIS — C50919 Malignant neoplasm of unspecified site of unspecified female breast: Secondary | ICD-10-CM

## 2010-12-10 ENCOUNTER — Ambulatory Visit
Admission: RE | Admit: 2010-12-10 | Discharge: 2010-12-10 | Disposition: A | Discharge status: Home / Self Care | Payer: Medicare Other | Source: Ambulatory Visit | Attending: Oncology | Admitting: Oncology

## 2010-12-10 DIAGNOSIS — C50911 Malignant neoplasm of unspecified site of right female breast: Secondary | ICD-10-CM

## 2010-12-10 MED ORDER — GADOBENATE DIMEGLUMINE 529 MG/ML IV SOLN
15.0000 mL | Freq: Once | INTRAVENOUS | Status: AC | PRN
  Administered 2010-12-10: 15 mL via INTRAVENOUS

## 2010-12-11 ENCOUNTER — Encounter (HOSPITAL_BASED_OUTPATIENT_CLINIC_OR_DEPARTMENT_OTHER): Payer: Medicare Other | Admitting: Oncology

## 2010-12-11 ENCOUNTER — Other Ambulatory Visit: Payer: Self-pay | Admitting: Oncology

## 2010-12-11 DIAGNOSIS — Z171 Estrogen receptor negative status [ER-]: Secondary | ICD-10-CM

## 2010-12-11 DIAGNOSIS — C50519 Malignant neoplasm of lower-outer quadrant of unspecified female breast: Secondary | ICD-10-CM

## 2010-12-11 DIAGNOSIS — C50919 Malignant neoplasm of unspecified site of unspecified female breast: Secondary | ICD-10-CM

## 2010-12-11 LAB — CBC WITH DIFFERENTIAL/PLATELET
Basophils Absolute: 0 10*3/uL (ref 0.0–0.1)
EOS%: 0.6 % (ref 0.0–7.0)
Eosinophils Absolute: 0 10*3/uL (ref 0.0–0.5)
HCT: 31.4 % — ABNORMAL LOW (ref 34.8–46.6)
HGB: 10.7 g/dL — ABNORMAL LOW (ref 11.6–15.9)
MCH: 34.6 pg — ABNORMAL HIGH (ref 25.1–34.0)
NEUT%: 56 % (ref 38.4–76.8)
lymph#: 0.8 10*3/uL — ABNORMAL LOW (ref 0.9–3.3)

## 2010-12-11 LAB — BASIC METABOLIC PANEL
BUN: 15 mg/dL (ref 6–23)
CO2: 27 mEq/L (ref 19–32)
Chloride: 98 mEq/L (ref 96–112)
Creatinine, Ser: 0.9 mg/dL (ref 0.50–1.10)
Glucose, Bld: 116 mg/dL — ABNORMAL HIGH (ref 70–99)

## 2010-12-25 ENCOUNTER — Other Ambulatory Visit: Payer: Self-pay | Admitting: Oncology

## 2010-12-25 ENCOUNTER — Encounter (HOSPITAL_BASED_OUTPATIENT_CLINIC_OR_DEPARTMENT_OTHER): Payer: Medicare Other | Admitting: Oncology

## 2010-12-25 DIAGNOSIS — Z5112 Encounter for antineoplastic immunotherapy: Secondary | ICD-10-CM

## 2010-12-25 DIAGNOSIS — Z5111 Encounter for antineoplastic chemotherapy: Secondary | ICD-10-CM

## 2010-12-25 DIAGNOSIS — F411 Generalized anxiety disorder: Secondary | ICD-10-CM

## 2010-12-25 DIAGNOSIS — C50519 Malignant neoplasm of lower-outer quadrant of unspecified female breast: Secondary | ICD-10-CM

## 2010-12-25 DIAGNOSIS — C50919 Malignant neoplasm of unspecified site of unspecified female breast: Secondary | ICD-10-CM

## 2010-12-25 LAB — CBC WITH DIFFERENTIAL/PLATELET
BASO%: 0.9 % (ref 0.0–2.0)
EOS%: 0.2 % (ref 0.0–7.0)
LYMPH%: 21.9 % (ref 14.0–49.7)
MCH: 34.3 pg — ABNORMAL HIGH (ref 25.1–34.0)
MCHC: 33.5 g/dL (ref 31.5–36.0)
MCV: 102.1 fL — ABNORMAL HIGH (ref 79.5–101.0)
MONO%: 11.5 % (ref 0.0–14.0)
NEUT#: 2.6 10*3/uL (ref 1.5–6.5)
RBC: 2.82 10*6/uL — ABNORMAL LOW (ref 3.70–5.45)
RDW: 15.8 % — ABNORMAL HIGH (ref 11.2–14.5)

## 2010-12-25 LAB — COMPREHENSIVE METABOLIC PANEL
AST: 25 U/L (ref 0–37)
Albumin: 4.4 g/dL (ref 3.5–5.2)
Alkaline Phosphatase: 109 U/L (ref 39–117)
Potassium: 4.2 mEq/L (ref 3.5–5.3)
Sodium: 139 mEq/L (ref 135–145)
Total Bilirubin: 0.3 mg/dL (ref 0.3–1.2)
Total Protein: 6.7 g/dL (ref 6.0–8.3)

## 2010-12-30 ENCOUNTER — Encounter (HOSPITAL_COMMUNITY)
Admission: RE | Admit: 2010-12-30 | Discharge: 2010-12-30 | Disposition: A | Discharge status: Home / Self Care | Payer: Medicare Other | Source: Ambulatory Visit | Attending: Surgery | Admitting: Surgery

## 2010-12-30 LAB — BASIC METABOLIC PANEL
BUN: 13 mg/dL (ref 6–23)
CO2: 30 mEq/L (ref 19–32)
Calcium: 9.7 mg/dL (ref 8.4–10.5)
Creatinine, Ser: 0.81 mg/dL (ref 0.50–1.10)
GFR calc non Af Amer: >60 mL/min (ref >60)
Glucose, Bld: 119 mg/dL — ABNORMAL HIGH (ref 70–99)
Sodium: 137 mEq/L (ref 135–145)

## 2010-12-30 LAB — DIFFERENTIAL
Basophils Absolute: 0 10*3/uL (ref 0.0–0.1)
Basophils Relative: 1 % (ref 0–1)
Eosinophils Relative: 2 % (ref 0–5)
Lymphocytes Relative: 25 % (ref 12–46)
Monocytes Absolute: 0.5 10*3/uL (ref 0.1–1.0)
Monocytes Relative: 11 % (ref 3–12)

## 2010-12-30 LAB — CBC
HCT: 30.1 % — ABNORMAL LOW (ref 36.0–46.0)
Hemoglobin: 9.9 g/dL — ABNORMAL LOW (ref 12.0–15.0)
MCH: 32.9 pg (ref 26.0–34.0)
MCHC: 32.9 g/dL (ref 30.0–36.0)
MCV: 100 fL (ref 78.0–100.0)

## 2010-12-31 NOTE — Progress Notes (Signed)
Quick Note:  Labs OK for surgery ______

## 2011-01-05 ENCOUNTER — Ambulatory Visit (HOSPITAL_COMMUNITY)
Admission: RE | Admit: 2011-01-05 | Discharge: 2011-01-06 | Disposition: A | Discharge status: Home / Self Care | Payer: Medicare Other | Source: Ambulatory Visit | Attending: Surgery | Admitting: Surgery

## 2011-01-05 ENCOUNTER — Other Ambulatory Visit (INDEPENDENT_AMBULATORY_CARE_PROVIDER_SITE_OTHER): Payer: Self-pay | Admitting: Surgery

## 2011-01-05 ENCOUNTER — Ambulatory Visit
Admission: RE | Admit: 2011-01-05 | Discharge: 2011-01-05 | Disposition: A | Discharge status: Home / Self Care | Payer: Medicare Other | Source: Ambulatory Visit | Attending: Surgery | Admitting: Surgery

## 2011-01-05 DIAGNOSIS — C50911 Malignant neoplasm of unspecified site of right female breast: Secondary | ICD-10-CM

## 2011-01-05 DIAGNOSIS — C50519 Malignant neoplasm of lower-outer quadrant of unspecified female breast: Secondary | ICD-10-CM | POA: Insufficient documentation

## 2011-01-05 DIAGNOSIS — K219 Gastro-esophageal reflux disease without esophagitis: Secondary | ICD-10-CM | POA: Insufficient documentation

## 2011-01-05 DIAGNOSIS — C50919 Malignant neoplasm of unspecified site of unspecified female breast: Secondary | ICD-10-CM

## 2011-01-05 DIAGNOSIS — Z01812 Encounter for preprocedural laboratory examination: Secondary | ICD-10-CM | POA: Insufficient documentation

## 2011-01-05 DIAGNOSIS — Z79899 Other long term (current) drug therapy: Secondary | ICD-10-CM | POA: Insufficient documentation

## 2011-01-06 HISTORY — PX: MASTECTOMY PARTIAL / LUMPECTOMY W/ AXILLARY LYMPHADENECTOMY: SUR852

## 2011-01-06 NOTE — Op Note (Signed)
NAMEALIENA, Laurie Allen                 ACCOUNT NO.:  [id number]  MEDICAL RECORD NO.:  [id number]  LOCATION:  5114                         FACILITY:  [facility]  PHYSICIAN:  Currie Paris, M.D.DATE OF BIRTH:  1946-04-12  DATE OF PROCEDURE:  01/05/2011 DATE OF DISCHARGE:                              OPERATIVE REPORT   PREOPERATIVE DIAGNOSIS:  Carcinoma right breast, lower outer quadrant stage II.  POSTOPERATIVE DIAGNOSIS:  Carcinoma right breast, lower outer quadrant stage II.  PROCEDURE:  Needle-guided right lumpectomy and right axillary dissection.  SURGEON:  Currie Paris, MD  ASSISTANT:  Dr. Magnus Ivan.  ANESTHESIA:  General.  CLINICAL HISTORY:  This is a 65 year old lady who presented recently with a right breast cancer with axillary metastases.  After discussion with the patient, she elected to have neoadjuvant chemotherapy which she has completed.  By MRI, she appears to have a complete response in her breast.  She was therefore scheduled for a needle-guided lumpectomy with axillary dissection.  DESCRIPTION OF PROCEDURE:  The patient was seen in the holding area and she had no further questions.  We marked the right breast as the operative side.  The patient was taken to the operating room and after satisfactory general anesthesia had been obtained, the right breast and axillary areas were prepped and draped as a sterile field.  I made an axillary incision transversely, divided subcutaneous tissues, and placed some self-retaining retractors.  I identified and opened the clavipectoral fascia.  I divided the axillary tissue down to the chest wall of the inferior margin of the axilla.  Once I had found and opened the clavipectoral fascia, identified the axillary vein and then swept the contents out from medial to lateral and superior to inferior.  I preserved both long thoracic and thoracodorsal nerves, although we did take the second intercostal.  There was no  gross axillary contents that appeared to be involved with tumor.  Once I had axillary contents cleared up, I was able to irrigate.  I used clips, cautery, and sutures for hemostasis.  I put about 10 mL of 0.25% Marcaine with epinephrine around the long thoracic and thoracodorsal nerves.  A 19 Blake drain was placed percutaneously and sutured with a 2-0 nylon. The incision was closed in layers with 3-0 Vicryl, 4-0 Monocryl, subcuticular and Dermabond.  Attention was then turned to the lumpectomy.  The guidewire entered laterally, tracked medially, and right at the inferior edge of the areola.  I measured where I thought the clip was based on the mammogram films, and then made a transverse incision directly over the guidewire, tracked extending incision where I thought I was beyond the tip of the guidewire.  I raised skin flaps superiorly and inferiorly, and then divided the breast tissue down to the chest wall medially, and I was indeed beyond the tip of the guidewire.  Once I got out of the chest wall, I began working back towards the guidewire entry point having freed that up as well, and getting the superior and inferior margins, and finally the lateral margin divided.  I could feel what it felt like a nodular density pretty much in the center of the specimen,  little bit more towards the lateral aspect.  Specimen mammogram showed the clip in the tissue.  I irrigated and made sure everything was dry.  I then put clips on the margins of the lumpectomy cavity.  I had some Marcaine in help with postop pain relief.  I irrigated again and everything appeared to be dry.  The breast was closed in layers with 3-0 Vicryl and 4-0 Monocryl subcuticular plus Dermabond.  The patient tolerated the procedure well.  There were no operative complications.  All counts were correct.     Currie Paris, M.D.     CJS/MEDQ  D:  01/05/2011  T:  01/06/2011  Job:  [id number]  cc:   Gretta Cool, M.D. Drue Second, M.D.  Electronically Signed by Cyndia Bent M.D. on 01/06/2011 07:55:29 AM

## 2011-01-13 ENCOUNTER — Ambulatory Visit (INDEPENDENT_AMBULATORY_CARE_PROVIDER_SITE_OTHER): Payer: Medicare Other | Admitting: General Surgery

## 2011-01-13 ENCOUNTER — Encounter (INDEPENDENT_AMBULATORY_CARE_PROVIDER_SITE_OTHER): Payer: Self-pay | Admitting: General Surgery

## 2011-01-13 VITALS — BP 124/76 | HR 84 | Temp 98.3°F | Ht 66.0 in | Wt 166.2 lb

## 2011-01-13 DIAGNOSIS — Z4889 Encounter for other specified surgical aftercare: Secondary | ICD-10-CM

## 2011-01-13 NOTE — Patient Instructions (Signed)
Continue drain care.  Return to clinic when drain output is less than 30-33ml/day.

## 2011-01-16 ENCOUNTER — Telehealth (INDEPENDENT_AMBULATORY_CARE_PROVIDER_SITE_OTHER): Payer: Self-pay | Admitting: General Surgery

## 2011-01-16 NOTE — Telephone Encounter (Signed)
Please call and check on pt..has ? On when to come back and see dr.streck

## 2011-01-23 ENCOUNTER — Ambulatory Visit
Admission: RE | Admit: 2011-01-23 | Discharge: 2011-01-23 | Disposition: A | Discharge status: Home / Self Care | Payer: Medicare Other | Source: Ambulatory Visit | Attending: Radiation Oncology | Admitting: Radiation Oncology

## 2011-01-23 DIAGNOSIS — K219 Gastro-esophageal reflux disease without esophagitis: Secondary | ICD-10-CM | POA: Insufficient documentation

## 2011-01-23 DIAGNOSIS — Z79899 Other long term (current) drug therapy: Secondary | ICD-10-CM | POA: Insufficient documentation

## 2011-01-23 DIAGNOSIS — Z510 Encounter for antineoplastic radiation therapy: Secondary | ICD-10-CM | POA: Insufficient documentation

## 2011-01-23 DIAGNOSIS — F411 Generalized anxiety disorder: Secondary | ICD-10-CM | POA: Insufficient documentation

## 2011-01-23 DIAGNOSIS — C50919 Malignant neoplasm of unspecified site of unspecified female breast: Secondary | ICD-10-CM | POA: Insufficient documentation

## 2011-01-23 DIAGNOSIS — R002 Palpitations: Secondary | ICD-10-CM | POA: Insufficient documentation

## 2011-01-26 ENCOUNTER — Encounter (HOSPITAL_BASED_OUTPATIENT_CLINIC_OR_DEPARTMENT_OTHER): Payer: Medicare Other

## 2011-01-26 DIAGNOSIS — Z5111 Encounter for antineoplastic chemotherapy: Secondary | ICD-10-CM

## 2011-01-26 DIAGNOSIS — C50919 Malignant neoplasm of unspecified site of unspecified female breast: Secondary | ICD-10-CM

## 2011-01-26 DIAGNOSIS — Z5112 Encounter for antineoplastic immunotherapy: Secondary | ICD-10-CM

## 2011-01-26 DIAGNOSIS — C50519 Malignant neoplasm of lower-outer quadrant of unspecified female breast: Secondary | ICD-10-CM

## 2011-01-26 DIAGNOSIS — Z171 Estrogen receptor negative status [ER-]: Secondary | ICD-10-CM

## 2011-01-26 LAB — CBC WITH DIFFERENTIAL (CANCER CENTER ONLY)
BASO#: 0 10*3/uL (ref 0.0–0.2)
BASO%: 0.2 % (ref 0.0–2.0)
EOS%: 1 % (ref 0.0–7.0)
HCT: 32.7 % — ABNORMAL LOW (ref 34.8–46.6)
HGB: 11 g/dL — ABNORMAL LOW (ref 11.6–15.9)
LYMPH%: 26.2 % (ref 14.0–48.0)
MCH: 33.1 pg (ref 26.0–34.0)
MCHC: 33.6 g/dL (ref 32.0–36.0)
MCV: 99 fL (ref 81–101)
NEUT%: 63.5 % (ref 39.6–80.0)
RDW: 12.6 % (ref 11.1–15.7)

## 2011-01-26 LAB — COMPREHENSIVE METABOLIC PANEL
ALT: 26 U/L (ref 0–35)
AST: 59 U/L — ABNORMAL HIGH (ref 0–37)
BUN: 14 mg/dL (ref 6–23)
Calcium: 10 mg/dL (ref 8.4–10.5)
Chloride: 100 mEq/L (ref 96–112)
Creatinine, Ser: 0.92 mg/dL (ref 0.50–1.10)
Total Bilirubin: 0.4 mg/dL (ref 0.3–1.2)

## 2011-01-27 ENCOUNTER — Ambulatory Visit (INDEPENDENT_AMBULATORY_CARE_PROVIDER_SITE_OTHER): Payer: Medicare Other | Admitting: Surgery

## 2011-01-27 ENCOUNTER — Encounter (INDEPENDENT_AMBULATORY_CARE_PROVIDER_SITE_OTHER): Payer: Self-pay | Admitting: Surgery

## 2011-01-27 VITALS — BP 128/78 | HR 72 | Temp 97.2°F

## 2011-01-27 DIAGNOSIS — C50419 Malignant neoplasm of upper-outer quadrant of unspecified female breast: Secondary | ICD-10-CM

## 2011-01-27 MED ORDER — DOXYCYCLINE HYCLATE 100 MG PO TABS: 100.0000 mg | ORAL_TABLET | Freq: Two times a day (BID) | ORAL | Status: AC

## 2011-01-27 NOTE — Patient Instructions (Signed)
Come back for a followup after drain removal next week

## 2011-01-27 NOTE — Progress Notes (Signed)
Chief complaint: Postop visit  History of present illness: This patient is status post a lumpectomy and node dissection for a right breast cancer which is status post neoadjuvant chemotherapy. She is back in for a followup visit now about three weeks out. Her drainage has slowed down considerably. She overall feels well.  Exam: General: Patient is alert and comfortable.  Breasts: Right breast incision is healing nicely. The axillary dissection here incision is healing as well. There is minimal drainage. There is no current evidence of infection.  Data reviewed pathology report is noted and I discussed that with her. She had about a 4 mm focus of residual cancer, and there is no residual cancer in any of her lymph nodes.  Impression stable exam doing well  Plan: I removed the drain today. I will follow her up in a week. I would go ahead and put her on some doxycycline, 100 mg b.i.d., because I am concerned about how long it has been and that she may develop some infection if she has any seroma form after the drain has been removed.

## 2011-01-29 ENCOUNTER — Encounter (HOSPITAL_BASED_OUTPATIENT_CLINIC_OR_DEPARTMENT_OTHER): Payer: Medicare Other

## 2011-01-29 DIAGNOSIS — C50519 Malignant neoplasm of lower-outer quadrant of unspecified female breast: Secondary | ICD-10-CM

## 2011-01-29 DIAGNOSIS — Z5111 Encounter for antineoplastic chemotherapy: Secondary | ICD-10-CM

## 2011-02-03 ENCOUNTER — Encounter (INDEPENDENT_AMBULATORY_CARE_PROVIDER_SITE_OTHER): Payer: Self-pay | Admitting: Surgery

## 2011-02-03 ENCOUNTER — Ambulatory Visit (INDEPENDENT_AMBULATORY_CARE_PROVIDER_SITE_OTHER): Payer: Medicare Other | Admitting: Surgery

## 2011-02-03 VITALS — BP 130/72 | HR 80 | Temp 97.1°F

## 2011-02-03 DIAGNOSIS — Z9889 Other specified postprocedural states: Secondary | ICD-10-CM

## 2011-02-03 DIAGNOSIS — C50519 Malignant neoplasm of lower-outer quadrant of unspecified female breast: Secondary | ICD-10-CM

## 2011-02-03 NOTE — Progress Notes (Signed)
Chief complaint: Postop visit  History of present illness: The patient is about one month status post lumpectomy and node dissection for a stage II cancer treated with neoadjuvant chemotherapy. We took her drain out on her last visit and she came back for a followup today. She's noticed no problems since the drain was removed.  Exam: Gen.: The patient is alert oriented and healthy-appearing Breasts: The incision is healed nicely. There is a little bit of thickening at the axillary incision but I don't think this is any fluid, just postoperative change. Impression: Doing well  Plan: We'll see back in three months

## 2011-02-03 NOTE — Patient Instructions (Signed)
Go to the ABC class when you can

## 2011-02-19 ENCOUNTER — Encounter (HOSPITAL_BASED_OUTPATIENT_CLINIC_OR_DEPARTMENT_OTHER): Payer: Medicare Other | Admitting: Oncology

## 2011-02-19 ENCOUNTER — Other Ambulatory Visit: Payer: Self-pay | Admitting: Oncology

## 2011-02-19 DIAGNOSIS — Z171 Estrogen receptor negative status [ER-]: Secondary | ICD-10-CM

## 2011-02-19 DIAGNOSIS — C50919 Malignant neoplasm of unspecified site of unspecified female breast: Secondary | ICD-10-CM

## 2011-02-19 DIAGNOSIS — Z5112 Encounter for antineoplastic immunotherapy: Secondary | ICD-10-CM

## 2011-02-19 DIAGNOSIS — Z5111 Encounter for antineoplastic chemotherapy: Secondary | ICD-10-CM

## 2011-02-19 DIAGNOSIS — C50519 Malignant neoplasm of lower-outer quadrant of unspecified female breast: Secondary | ICD-10-CM

## 2011-02-19 LAB — CBC WITH DIFFERENTIAL/PLATELET
Basophils Absolute: 0 10*3/uL (ref 0.0–0.1)
HCT: 31 % — ABNORMAL LOW (ref 34.8–46.6)
HGB: 10.6 g/dL — ABNORMAL LOW (ref 11.6–15.9)
LYMPH%: 29.4 % (ref 14.0–49.7)
MCH: 32.3 pg (ref 25.1–34.0)
MCHC: 34.1 g/dL (ref 31.5–36.0)
MONO#: 0.3 10*3/uL (ref 0.1–0.9)
NEUT%: 58.9 % (ref 38.4–76.8)
Platelets: 217 10*3/uL (ref 145–400)
WBC: 3.5 10*3/uL — ABNORMAL LOW (ref 3.9–10.3)
lymph#: 1 10*3/uL (ref 0.9–3.3)

## 2011-02-19 LAB — COMPREHENSIVE METABOLIC PANEL
BUN: 21 mg/dL (ref 6–23)
CO2: 26 mEq/L (ref 19–32)
Calcium: 9.6 mg/dL (ref 8.4–10.5)
Chloride: 99 mEq/L (ref 96–112)
Creatinine, Ser: 1.04 mg/dL (ref 0.50–1.10)
Glucose, Bld: 113 mg/dL — ABNORMAL HIGH (ref 70–99)
Total Bilirubin: 0.2 mg/dL — ABNORMAL LOW (ref 0.3–1.2)

## 2011-02-23 ENCOUNTER — Encounter: Payer: Self-pay | Admitting: Cardiovascular Disease

## 2011-02-23 ENCOUNTER — Emergency Department (HOSPITAL_COMMUNITY)
Admission: EM | Admit: 2011-02-23 | Discharge: 2011-02-23 | Disposition: A | Discharge status: Home / Self Care | Payer: Medicare Other | Attending: Emergency Medicine | Admitting: Emergency Medicine

## 2011-02-23 ENCOUNTER — Emergency Department (HOSPITAL_COMMUNITY): Payer: Medicare Other

## 2011-02-23 DIAGNOSIS — R002 Palpitations: Secondary | ICD-10-CM | POA: Insufficient documentation

## 2011-02-23 DIAGNOSIS — Z923 Personal history of irradiation: Secondary | ICD-10-CM | POA: Insufficient documentation

## 2011-02-23 DIAGNOSIS — F411 Generalized anxiety disorder: Secondary | ICD-10-CM | POA: Insufficient documentation

## 2011-02-23 DIAGNOSIS — Z9010 Acquired absence of unspecified breast and nipple: Secondary | ICD-10-CM | POA: Insufficient documentation

## 2011-02-23 DIAGNOSIS — Z9221 Personal history of antineoplastic chemotherapy: Secondary | ICD-10-CM | POA: Insufficient documentation

## 2011-02-23 DIAGNOSIS — C50919 Malignant neoplasm of unspecified site of unspecified female breast: Secondary | ICD-10-CM | POA: Insufficient documentation

## 2011-02-23 DIAGNOSIS — K219 Gastro-esophageal reflux disease without esophagitis: Secondary | ICD-10-CM | POA: Insufficient documentation

## 2011-02-23 LAB — POCT I-STAT, CHEM 8
BUN: 11 mg/dL (ref 6–23)
Calcium, Ion: 1.15 mmol/L (ref 1.12–1.32)
Chloride: 94 meq/L — ABNORMAL LOW (ref 96–112)
Creatinine, Ser: 0.8 mg/dL (ref 0.50–1.10)
Glucose, Bld: 106 mg/dL — ABNORMAL HIGH (ref 70–99)
HCT: 34 % — ABNORMAL LOW (ref 36.0–46.0)
Hemoglobin: 11.6 g/dL — ABNORMAL LOW (ref 12.0–15.0)
Potassium: 4 meq/L (ref 3.5–5.1)
Sodium: 129 meq/L — ABNORMAL LOW (ref 135–145)
TCO2: 25 mmol/L (ref 0–100)

## 2011-02-23 LAB — DIFFERENTIAL
Basophils Absolute: 0 K/uL (ref 0.0–0.1)
Basophils Relative: 1 % (ref 0–1)
Eosinophils Absolute: 0.1 K/uL (ref 0.0–0.7)
Eosinophils Relative: 2 % (ref 0–5)
Lymphocytes Relative: 28 % (ref 12–46)
Lymphs Abs: 1.1 K/uL (ref 0.7–4.0)
Monocytes Absolute: 0.4 K/uL (ref 0.1–1.0)
Monocytes Relative: 10 % (ref 3–12)
Neutro Abs: 2.4 K/uL (ref 1.7–7.7)
Neutrophils Relative %: 61 % (ref 43–77)

## 2011-02-23 LAB — URINALYSIS, ROUTINE W REFLEX MICROSCOPIC
Bilirubin Urine: NEGATIVE
Glucose, UA: NEGATIVE mg/dL
Hgb urine dipstick: NEGATIVE
Ketones, ur: NEGATIVE mg/dL
Nitrite: NEGATIVE
Protein, ur: NEGATIVE mg/dL
Specific Gravity, Urine: 1.003 — ABNORMAL LOW (ref 1.005–1.030)
Urobilinogen, UA: 0.2 mg/dL (ref 0.0–1.0)
pH: 8 (ref 5.0–8.0)

## 2011-02-23 LAB — URINE MICROSCOPIC-ADD ON

## 2011-02-23 LAB — CBC
HCT: 31.5 % — ABNORMAL LOW (ref 36.0–46.0)
MCHC: 34 g/dL (ref 30.0–36.0)
MCV: 91.6 fL (ref 78.0–100.0)
RDW: 13.1 % (ref 11.5–15.5)

## 2011-02-23 LAB — POCT I-STAT TROPONIN I: Troponin i, poc: 0 ng/mL (ref 0.00–0.08)

## 2011-02-24 ENCOUNTER — Ambulatory Visit (INDEPENDENT_AMBULATORY_CARE_PROVIDER_SITE_OTHER): Payer: Medicare Other | Admitting: Cardiovascular Disease

## 2011-02-24 ENCOUNTER — Encounter: Payer: Self-pay | Admitting: Cardiovascular Disease

## 2011-02-24 DIAGNOSIS — I10 Essential (primary) hypertension: Secondary | ICD-10-CM

## 2011-02-24 DIAGNOSIS — C50919 Malignant neoplasm of unspecified site of unspecified female breast: Secondary | ICD-10-CM

## 2011-02-24 DIAGNOSIS — R002 Palpitations: Secondary | ICD-10-CM

## 2011-02-24 MED ORDER — METOPROLOL SUCCINATE ER 50 MG PO TB24: 50.0000 mg | ORAL_TABLET | Freq: Every day | ORAL | Status: DC

## 2011-02-24 NOTE — Assessment & Plan Note (Signed)
Benign sounding with normal echo 12/11  Try Toprol fo blunting of sympathetic response and anxiety.  No need for event monitor at this time

## 2011-02-24 NOTE — Progress Notes (Signed)
65 yo referred by [facility] ER  Had GERD and palpitations seen yesterday.  R/O Tele normal no arrhythmia detected.  Had portacath and Breast CA/  Finished chemo and just starting XRT.  Palpitatons related to anxiety.  On celexa, ambian and xanax.  Worse around time of chemo and XRT.  No dsypnea.  Reviewed echo from 12/11 and normal EF no valve disease.  No syncope.  No previous history of DCM, valve disease CAD or chronic lung disease.  Compliant with meds.  NO cough sputum SSCP or prysyncope  ROS: Denies fever, malais, weight loss, blurry vision, decreased visual acuity, cough, sputum, SOB, hemoptysis, pleuritic pain, , heartburn, abdominal pain, melena, lower extremity edema, claudication, or rash.  All other systems reviewed and negative  General: Affect appropriate Healthy:  appears stated age HEENT: normal Neck supple with no adenopathy porta cath on right JVP normal no bruits no thyromegaly Lungs clear with no wheezing and good diaphragmatic motion Heart:  S1/S2 no murmur,rub, gallop or click PMI normal Abdomen: benighn, BS positve, no tenderness, no AAA no bruit.  No HSM or HJR Distal pulses intact with no bruits No edema Neuro non-focal Skin warm and dry No muscular weakness   Current Outpatient Prescriptions  Medication Sig Dispense Refill  . ALPRAZolam (XANAX) 0.5 MG tablet Take 0.5 mg by mouth at bedtime as needed.        . citalopram (CELEXA) 10 MG tablet Take 1 tablet by mouth Daily.      . famotidine (PEPCID) 10 MG tablet Take 10 mg by mouth daily.        Marland Kitchen lidocaine-prilocaine (EMLA) cream Apply topically as needed.        . pantoprazole (PROTONIX) 40 MG tablet Take 1 tablet by mouth Daily.      . polyvinyl alcohol (LIQUID TEARS) 1.4 % ophthalmic solution 1 drop as needed.        . promethazine (PHENERGAN) 25 MG suppository Place 25 mg rectally every 6 (six) hours as needed.        . sucralfate (CARAFATE) 1 G tablet Take 1 tablet by mouth 4 times daily.      Marland Kitchen zolpidem (AMBIEN  CR) 6.25 MG CR tablet Take 1 tablet by mouth At bedtime. prn      . zolpidem (AMBIEN) 5 MG tablet Take 5 mg by mouth at bedtime as needed.          Allergies  Review of patient's allergies indicates no known allergies.  Electrocardiogram:  NSR 86 LAE otherwise normal done 4/12  Assessment and Plan

## 2011-02-24 NOTE — Assessment & Plan Note (Signed)
Well controlled.  Continue current medications and low sodium Dash type diet.    

## 2011-02-24 NOTE — Patient Instructions (Signed)
Your physician recommends that you schedule a follow-up appointment in: AS NEEDED  CALL OFF IN 3-4 WEEKS TO GIVE UPDATE IF MED HELPING  Your physician has recommended you make the following change in your medication: START TOPROL 50 MG 1 TAB EVERY DAY

## 2011-02-24 NOTE — Assessment & Plan Note (Signed)
Continue XRT  F/U oncology.  Consder D/C porta cath in next year as tip may impinge on RA and facilitate palpitations

## 2011-02-28 ENCOUNTER — Encounter (INDEPENDENT_AMBULATORY_CARE_PROVIDER_SITE_OTHER): Payer: Self-pay | Admitting: Surgery

## 2011-03-01 ENCOUNTER — Inpatient Hospital Stay (INDEPENDENT_AMBULATORY_CARE_PROVIDER_SITE_OTHER)
Admission: RE | Admit: 2011-03-01 | Discharge: 2011-03-01 | Disposition: A | Discharge status: Home / Self Care | Payer: Medicare Other | Source: Ambulatory Visit | Attending: Emergency Medicine | Admitting: Emergency Medicine

## 2011-03-01 DIAGNOSIS — R198 Other specified symptoms and signs involving the digestive system and abdomen: Secondary | ICD-10-CM

## 2011-03-01 DIAGNOSIS — R1013 Epigastric pain: Secondary | ICD-10-CM

## 2011-03-04 ENCOUNTER — Ambulatory Visit (HOSPITAL_COMMUNITY)
Admission: RE | Admit: 2011-03-04 | Discharge: 2011-03-04 | Disposition: A | Discharge status: Home / Self Care | Payer: Medicare Other | Source: Ambulatory Visit | Attending: Oncology | Admitting: Oncology

## 2011-03-04 ENCOUNTER — Ambulatory Visit (HOSPITAL_COMMUNITY): Payer: Medicare Other

## 2011-03-04 DIAGNOSIS — Z09 Encounter for follow-up examination after completed treatment for conditions other than malignant neoplasm: Secondary | ICD-10-CM

## 2011-03-04 DIAGNOSIS — I079 Rheumatic tricuspid valve disease, unspecified: Secondary | ICD-10-CM | POA: Insufficient documentation

## 2011-03-04 DIAGNOSIS — C50919 Malignant neoplasm of unspecified site of unspecified female breast: Secondary | ICD-10-CM | POA: Insufficient documentation

## 2011-03-06 ENCOUNTER — Other Ambulatory Visit: Payer: Self-pay | Admitting: Gastroenterology

## 2011-03-12 ENCOUNTER — Other Ambulatory Visit: Payer: Self-pay | Admitting: Oncology

## 2011-03-12 ENCOUNTER — Encounter (HOSPITAL_BASED_OUTPATIENT_CLINIC_OR_DEPARTMENT_OTHER): Payer: Medicare Other | Admitting: Oncology

## 2011-03-12 DIAGNOSIS — Z5112 Encounter for antineoplastic immunotherapy: Secondary | ICD-10-CM

## 2011-03-12 DIAGNOSIS — C50519 Malignant neoplasm of lower-outer quadrant of unspecified female breast: Secondary | ICD-10-CM

## 2011-03-12 DIAGNOSIS — Z171 Estrogen receptor negative status [ER-]: Secondary | ICD-10-CM

## 2011-03-12 LAB — COMPREHENSIVE METABOLIC PANEL
ALT: 18 U/L (ref 0–35)
AST: 22 U/L (ref 0–37)
Albumin: 3.8 g/dL (ref 3.5–5.2)
BUN: 21 mg/dL (ref 6–23)
Calcium: 9.1 mg/dL (ref 8.4–10.5)
Chloride: 96 mEq/L (ref 96–112)
Potassium: 4.6 mEq/L (ref 3.5–5.3)
Sodium: 131 mEq/L — ABNORMAL LOW (ref 135–145)
Total Protein: 6.7 g/dL (ref 6.0–8.3)

## 2011-03-12 LAB — CBC WITH DIFFERENTIAL/PLATELET
BASO%: 0.3 % (ref 0.0–2.0)
Basophils Absolute: 0 10*3/uL (ref 0.0–0.1)
EOS%: 1.9 % (ref 0.0–7.0)
HGB: 10.5 g/dL — ABNORMAL LOW (ref 11.6–15.9)
MCH: 30.6 pg (ref 25.1–34.0)
RBC: 3.43 10*6/uL — ABNORMAL LOW (ref 3.70–5.45)
RDW: 13.5 % (ref 11.2–14.5)
lymph#: 0.7 10*3/uL — ABNORMAL LOW (ref 0.9–3.3)

## 2011-04-02 ENCOUNTER — Other Ambulatory Visit: Payer: Self-pay | Admitting: Oncology

## 2011-04-02 ENCOUNTER — Ambulatory Visit (HOSPITAL_COMMUNITY)
Admission: RE | Admit: 2011-04-02 | Discharge: 2011-04-02 | Disposition: A | Discharge status: Home / Self Care | Payer: Medicare Other | Source: Ambulatory Visit | Attending: Oncology | Admitting: Oncology

## 2011-04-02 ENCOUNTER — Encounter (HOSPITAL_BASED_OUTPATIENT_CLINIC_OR_DEPARTMENT_OTHER): Payer: Medicare Other | Admitting: Oncology

## 2011-04-02 DIAGNOSIS — Z171 Estrogen receptor negative status [ER-]: Secondary | ICD-10-CM

## 2011-04-02 DIAGNOSIS — Z5112 Encounter for antineoplastic immunotherapy: Secondary | ICD-10-CM

## 2011-04-02 DIAGNOSIS — M79609 Pain in unspecified limb: Secondary | ICD-10-CM

## 2011-04-02 DIAGNOSIS — C50519 Malignant neoplasm of lower-outer quadrant of unspecified female breast: Secondary | ICD-10-CM

## 2011-04-02 LAB — CBC WITH DIFFERENTIAL/PLATELET
BASO%: 1.7 % (ref 0.0–2.0)
EOS%: 2 % (ref 0.0–7.0)
MCH: 30.3 pg (ref 25.1–34.0)
MCHC: 33.7 g/dL (ref 31.5–36.0)
MCV: 90 fL (ref 79.5–101.0)
MONO%: 11.7 % (ref 0.0–14.0)
RBC: 3.79 10*6/uL (ref 3.70–5.45)
RDW: 14.2 % (ref 11.2–14.5)
lymph#: 0.5 10*3/uL — ABNORMAL LOW (ref 0.9–3.3)

## 2011-04-02 LAB — COMPREHENSIVE METABOLIC PANEL
ALT: 23 U/L (ref 0–35)
AST: 27 U/L (ref 0–37)
Albumin: 4.3 g/dL (ref 3.5–5.2)
Calcium: 9.2 mg/dL (ref 8.4–10.5)
Chloride: 96 mEq/L (ref 96–112)
Potassium: 4.6 mEq/L (ref 3.5–5.3)

## 2011-04-17 ENCOUNTER — Encounter: Payer: Self-pay | Admitting: *Deleted

## 2011-04-23 ENCOUNTER — Other Ambulatory Visit: Payer: Self-pay | Admitting: Oncology

## 2011-04-23 ENCOUNTER — Encounter (HOSPITAL_BASED_OUTPATIENT_CLINIC_OR_DEPARTMENT_OTHER): Payer: Medicare Other | Admitting: Oncology

## 2011-04-23 DIAGNOSIS — Z5111 Encounter for antineoplastic chemotherapy: Secondary | ICD-10-CM

## 2011-04-23 DIAGNOSIS — C50519 Malignant neoplasm of lower-outer quadrant of unspecified female breast: Secondary | ICD-10-CM

## 2011-04-23 DIAGNOSIS — Z171 Estrogen receptor negative status [ER-]: Secondary | ICD-10-CM

## 2011-04-23 DIAGNOSIS — C50919 Malignant neoplasm of unspecified site of unspecified female breast: Secondary | ICD-10-CM

## 2011-04-23 LAB — CBC WITH DIFFERENTIAL/PLATELET
BASO%: 0.9 % (ref 0.0–2.0)
EOS%: 1.9 % (ref 0.0–7.0)
MCH: 30.8 pg (ref 25.1–34.0)
MCHC: 34.1 g/dL (ref 31.5–36.0)
RDW: 14.4 % (ref 11.2–14.5)
lymph#: 0.8 10*3/uL — ABNORMAL LOW (ref 0.9–3.3)

## 2011-04-23 LAB — COMPREHENSIVE METABOLIC PANEL
Albumin: 4.3 g/dL (ref 3.5–5.2)
Alkaline Phosphatase: 117 U/L (ref 39–117)
BUN: 15 mg/dL (ref 6–23)
CO2: 19 mEq/L (ref 19–32)
Glucose, Bld: 118 mg/dL — ABNORMAL HIGH (ref 70–99)
Sodium: 134 mEq/L — ABNORMAL LOW (ref 135–145)
Total Bilirubin: 0.4 mg/dL (ref 0.3–1.2)
Total Protein: 6.7 g/dL (ref 6.0–8.3)

## 2011-05-01 ENCOUNTER — Ambulatory Visit
Admission: RE | Admit: 2011-05-01 | Discharge: 2011-05-01 | Disposition: A | Discharge status: Home / Self Care | Payer: Medicare Other | Source: Ambulatory Visit | Attending: Radiation Oncology | Admitting: Radiation Oncology

## 2011-05-04 NOTE — Progress Notes (Signed)
CC:   Currie Paris, M.D. Drue Second, M.D. Gretta Cool, M.D.  DIAGNOSIS:  Invasive ductal carcinoma of the right breast.  INTERVAL SINCE RADIATION TREATMENT:  1 month.  INTERVAL HISTORY:  Laurie Allen returns to clinic today for followup.  She states that she has done quite well since she finished her treatment. She does note some occasional tenderness and numbness in the right axilla.  She has been continuing with Herceptin, and she indicates that she is scheduled to finish this in February.  She, therefore, continues to see Medical Oncology on a regular basis.  PHYSICAL EXAMINATION:  The patient's skin looks excellent at this time with mild hyperpigmentation in the treatment area.  IMPRESSION AND PLAN:  Laurie Allen did well during treatment, and she appears to have continued to do very well in terms of her recovery.  I will have her return to our clinic on a p.r.n. basis.    ______________________________ Radene Gunning, M.D., Ph.D. JSM/MEDQ  D:  05/01/2011  T:  05/04/2011  Job:  [id number]

## 2011-05-09 ENCOUNTER — Other Ambulatory Visit: Payer: Self-pay | Admitting: Oncology

## 2011-05-14 ENCOUNTER — Ambulatory Visit (HOSPITAL_BASED_OUTPATIENT_CLINIC_OR_DEPARTMENT_OTHER): Payer: Medicare Other

## 2011-05-14 ENCOUNTER — Other Ambulatory Visit: Payer: Self-pay | Admitting: Oncology

## 2011-05-14 ENCOUNTER — Ambulatory Visit: Payer: Medicare Other | Admitting: Physician Assistant

## 2011-05-14 ENCOUNTER — Encounter (INDEPENDENT_AMBULATORY_CARE_PROVIDER_SITE_OTHER): Payer: Self-pay | Admitting: Surgery

## 2011-05-14 ENCOUNTER — Other Ambulatory Visit (HOSPITAL_BASED_OUTPATIENT_CLINIC_OR_DEPARTMENT_OTHER): Payer: Medicare Other | Admitting: Lab

## 2011-05-14 VITALS — BP 152/72 | HR 85 | Temp 97.1°F | Ht 66.0 in | Wt 163.0 lb

## 2011-05-14 DIAGNOSIS — C50919 Malignant neoplasm of unspecified site of unspecified female breast: Secondary | ICD-10-CM

## 2011-05-14 DIAGNOSIS — Z171 Estrogen receptor negative status [ER-]: Secondary | ICD-10-CM

## 2011-05-14 DIAGNOSIS — Z5112 Encounter for antineoplastic immunotherapy: Secondary | ICD-10-CM

## 2011-05-14 DIAGNOSIS — C50519 Malignant neoplasm of lower-outer quadrant of unspecified female breast: Secondary | ICD-10-CM

## 2011-05-14 LAB — CBC WITH DIFFERENTIAL/PLATELET
Basophils Absolute: 0 10*3/uL (ref 0.0–0.1)
Eosinophils Absolute: 0.1 10*3/uL (ref 0.0–0.5)
HGB: 11.5 g/dL — ABNORMAL LOW (ref 11.6–15.9)
MONO#: 0.3 10*3/uL (ref 0.1–0.9)
NEUT#: 2.5 10*3/uL (ref 1.5–6.5)
Platelets: 202 10*3/uL (ref 145–400)
RBC: 3.74 10*6/uL (ref 3.70–5.45)
RDW: 14.3 % (ref 11.2–14.5)
WBC: 3.9 10*3/uL (ref 3.9–10.3)
nRBC: 0 % (ref 0–0)

## 2011-05-14 LAB — BASIC METABOLIC PANEL
CO2: 26 mEq/L (ref 19–32)
Glucose, Bld: 93 mg/dL (ref 70–99)
Potassium: 4.3 mEq/L (ref 3.5–5.3)
Sodium: 135 mEq/L (ref 135–145)

## 2011-05-14 MED ORDER — DIPHENHYDRAMINE HCL 25 MG PO CAPS
50.0000 mg | ORAL_CAPSULE | Freq: Once | ORAL | Status: AC
  Administered 2011-05-14: 50 mg via ORAL

## 2011-05-14 MED ORDER — ACETAMINOPHEN 325 MG PO TABS
650.0000 mg | ORAL_TABLET | Freq: Once | ORAL | Status: AC
  Administered 2011-05-14: 650 mg via ORAL

## 2011-05-14 MED ORDER — SODIUM CHLORIDE 0.9 % IJ SOLN
10.0000 mL | INTRAMUSCULAR | Status: DC | PRN
  Administered 2011-05-14: 10 mL
  Filled 2011-05-14: qty 10

## 2011-05-14 MED ORDER — LORAZEPAM 2 MG/ML IJ SOLN
0.5000 mg | Freq: Once | INTRAMUSCULAR | Status: AC
  Administered 2011-05-14: 0.5 mg via INTRAVENOUS

## 2011-05-14 MED ORDER — SODIUM CHLORIDE 0.9 % IV SOLN
6.0000 mg/kg | Freq: Once | INTRAVENOUS | Status: AC
  Administered 2011-05-14: 441 mg via INTRAVENOUS
  Filled 2011-05-14: qty 21

## 2011-05-14 MED ORDER — SODIUM CHLORIDE 0.9 % IV SOLN
Freq: Once | INTRAVENOUS | Status: AC
  Administered 2011-05-14: 11:00:00 via INTRAVENOUS

## 2011-05-14 MED ORDER — HEPARIN SOD (PORK) LOCK FLUSH 100 UNIT/ML IV SOLN
500.0000 [IU] | Freq: Once | INTRAVENOUS | Status: AC | PRN
  Administered 2011-05-14: 500 [IU]
  Filled 2011-05-14: qty 5

## 2011-05-14 NOTE — Progress Notes (Signed)
DIAGNOSIS:  A 65 year old [place], [place] woman with a history of a locally advanced ER/PR negative, HER-2 positive right breast carcinoma, S/P neoadjuvant chemotherapy followed by a right lumpectomy with axillary node dissection revealing microscopic residual invasive ductal carcinoma with 18 nodes negative for metastatic disease. Nuclear grade 2.  She completed radiation therapy and has been continuing ongoing q.3 week maintenance Herceptin.  CURRENT THERAPY:  Every 3 week dose of Herceptin.  SUBJECTIVE:  Laurie Allen is seen today with her family member in accompaniment in anticipation of her next q.3 week dose of Herceptin.  She is feeling well, denying any unexplained fevers, chills, night sweats, shortness of breath, or chest pain.  No nausea, emesis, diarrhea, or constipation issues.  Overall, her energy level is quite good.  REVIEW OF SYSTEMS:  Negative.  ALLERGIES:  Compazine.  CURRENT MEDICATIONS:  Reviewed with the patient and updated as per EMR.  PERFORMANCE STATUS:  ECOG status of zero.  PHYSICAL EXAMINATION:  Vital Signs:  Blood pressure is 152/72.  Pulse 85.  Respirations 20.  Temp 97.1.  Weight 163 pounds.  HEENT: Conjunctivae are pink.  Sclerae are anicteric.  Oropharynx is benign without oral mucositis or candidosis.  Lungs:  Clear to auscultation without wheezing or rhonchi.  Heart:  Regular rate and rhythm without murmurs, rubs, gallops, or clicks.  Abdomen:  Soft.  Normal bowel sounds.  No organomegaly.  Extremities:  Free of pedal edema. Neurologic Exam:  Nonfocal with patient alert and oriented.  LABORATORY DATA:  Hemoglobin 11.5 g, platelet count 202,000, WBC 3,900 with an ANC of 2,500.  CMET from today is pending.  IMPRESSION:  A 65 year old [place], [place] woman with a history of a stage II, ER/PR negative, HER-2 positive right breast carcinoma, status post neoadjuvant chemotherapy completed in June 2012 followed by a right lumpectomy  with axillary node dissection and completion of radiation therapy.  She is on current q.3 week maintenance Herceptin, due for next dosing today.  The case has been reviewed with Dr. Park Breed.  PLAN:  Vestal will receive treatment as scheduled.  Ativan has been added to her premeds.  Benadryl will remain at 50 mg.  She has an appointment with Dr. Jamey Ripa for surgical followup tomorrow.  We will see her back in 3 weeks' time prior to her next q.3 week dose of maintenance Herceptin.  She knows to contact us in the interim if the need should arise.    ______________________________ Laurie Allen, Laurie Allen  D:  05/14/2011  T:  05/14/2011  Job:  [id number]

## 2011-05-14 NOTE — Patient Instructions (Signed)
[facility] Discharge Instructions for Patients Receiving Chemotherapy  Today you received the following chemotherapy agents Herceptoin  To help prevent nausea and vomiting after your treatment, we encourage you to take your nausea medication Compazine suppositories 25mg  1 per rectum every 6 hours as needed for nausea or vomiting Begin taking it at anytime and take it as often as prescribed for the next 6 hours.   If you develop nausea and vomiting that is not controlled by your nausea medication, call the clinic. If it is after clinic hours your family physician or the after hours number for the clinic or go to the Emergency Department.   BELOW ARE SYMPTOMS THAT SHOULD BE REPORTED IMMEDIATELY:  *FEVER GREATER THAN 101.0 F  *CHILLS WITH OR WITHOUT FEVER  NAUSEA AND VOMITING THAT IS NOT CONTROLLED WITH YOUR NAUSEA MEDICATION  *UNUSUAL SHORTNESS OF BREATH  *UNUSUAL BRUISING OR BLEEDING  TENDERNESS IN MOUTH AND THROAT WITH OR WITHOUT PRESENCE OF ULCERS  *URINARY PROBLEMS  *BOWEL PROBLEMS  UNUSUAL RASH Items with * indicate a potential emergency and should be followed up as soon as possible.  One of the nurses will contact you 24 hours after your treatment. Please let the nurse know about any problems that you may have experienced. Feel free to call the clinic you have any questions or concerns. The clinic phone number is [phone].   I have been informed and understand all the instructions given to me. I know to contact the clinic, my physician, or go to the Emergency Department if any problems should occur. I do not have any questions at this time, but understand that I may call the clinic during office hours or the Patient Navigator at [phone] should I have any questions or need assistance in obtaining follow up care.    __________________________________________  _____________  __________ Signature of Patient or Authorized Representative             Date                   Time    __________________________________________ Nurse's Signature

## 2011-05-14 NOTE — Progress Notes (Signed)
Progress note dictated-CTS 

## 2011-05-15 ENCOUNTER — Encounter (INDEPENDENT_AMBULATORY_CARE_PROVIDER_SITE_OTHER): Payer: Self-pay | Admitting: Surgery

## 2011-05-15 ENCOUNTER — Ambulatory Visit (INDEPENDENT_AMBULATORY_CARE_PROVIDER_SITE_OTHER): Payer: Medicare Other | Admitting: Surgery

## 2011-05-15 VITALS — BP 134/78 | HR 80 | Temp 96.4°F | Resp 18 | Ht 66.0 in | Wt 166.5 lb

## 2011-05-15 DIAGNOSIS — Z853 Personal history of malignant neoplasm of breast: Secondary | ICD-10-CM

## 2011-05-15 NOTE — Patient Instructions (Signed)
Call if any problems. I think the discomfort and "Freeze" you are having will improve over the next few months

## 2011-05-15 NOTE — Progress Notes (Signed)
Chief complaint:Breast cancer F/U History of present illness: The patient is about four months status post lumpectomy and node dissection for a stage II cancer treated with neoadjuvant chemotherapy. She has finished radiation. Still has some posterior axillary discomfort Exam: Gen.: The patient is alert oriented and healthy-appearing Breasts: The incision is healed nicely. There remains a little bit of thickening at the axillary incision anteriority think just postoperative change.   Impression: Doing well  Plan: We'll see back in three months

## 2011-06-04 ENCOUNTER — Other Ambulatory Visit: Payer: Self-pay | Admitting: Oncology

## 2011-06-04 ENCOUNTER — Other Ambulatory Visit (HOSPITAL_BASED_OUTPATIENT_CLINIC_OR_DEPARTMENT_OTHER): Payer: Medicare Other | Admitting: Lab

## 2011-06-04 ENCOUNTER — Encounter: Payer: Self-pay | Admitting: Oncology

## 2011-06-04 ENCOUNTER — Ambulatory Visit (HOSPITAL_BASED_OUTPATIENT_CLINIC_OR_DEPARTMENT_OTHER): Payer: Medicare Other | Admitting: Oncology

## 2011-06-04 ENCOUNTER — Ambulatory Visit (HOSPITAL_BASED_OUTPATIENT_CLINIC_OR_DEPARTMENT_OTHER): Payer: Medicare Other

## 2011-06-04 DIAGNOSIS — R21 Rash and other nonspecific skin eruption: Secondary | ICD-10-CM

## 2011-06-04 DIAGNOSIS — Z171 Estrogen receptor negative status [ER-]: Secondary | ICD-10-CM

## 2011-06-04 DIAGNOSIS — Z5112 Encounter for antineoplastic immunotherapy: Secondary | ICD-10-CM

## 2011-06-04 DIAGNOSIS — C50519 Malignant neoplasm of lower-outer quadrant of unspecified female breast: Secondary | ICD-10-CM

## 2011-06-04 DIAGNOSIS — Z09 Encounter for follow-up examination after completed treatment for conditions other than malignant neoplasm: Secondary | ICD-10-CM

## 2011-06-04 DIAGNOSIS — C773 Secondary and unspecified malignant neoplasm of axilla and upper limb lymph nodes: Secondary | ICD-10-CM

## 2011-06-04 HISTORY — DX: Rash and other nonspecific skin eruption: R21

## 2011-06-04 LAB — COMPREHENSIVE METABOLIC PANEL
ALT: 12 U/L (ref 0–35)
Albumin: 4.3 g/dL (ref 3.5–5.2)
CO2: 24 mEq/L (ref 19–32)
Calcium: 8.9 mg/dL (ref 8.4–10.5)
Chloride: 103 mEq/L (ref 96–112)
Potassium: 4.3 mEq/L (ref 3.5–5.3)
Sodium: 136 mEq/L (ref 135–145)
Total Protein: 6.9 g/dL (ref 6.0–8.3)

## 2011-06-04 LAB — CBC WITH DIFFERENTIAL/PLATELET
BASO%: 0.8 % (ref 0.0–2.0)
Basophils Absolute: 0 10*3/uL (ref 0.0–0.1)
HCT: 34.1 % — ABNORMAL LOW (ref 34.8–46.6)
HGB: 11.2 g/dL — ABNORMAL LOW (ref 11.6–15.9)
LYMPH%: 25.9 % (ref 14.0–49.7)
MCHC: 32.8 g/dL (ref 31.5–36.0)
MONO#: 0.5 10*3/uL (ref 0.1–0.9)
NEUT%: 59.9 % (ref 38.4–76.8)
Platelets: 202 10*3/uL (ref 145–400)
WBC: 4 10*3/uL (ref 3.9–10.3)
lymph#: 1 10*3/uL (ref 0.9–3.3)

## 2011-06-04 MED ORDER — TRASTUZUMAB CHEMO INJECTION 440 MG
6.0000 mg/kg | Freq: Once | INTRAVENOUS | Status: AC
  Administered 2011-06-04: 441 mg via INTRAVENOUS
  Filled 2011-06-04: qty 21

## 2011-06-04 MED ORDER — ACETAMINOPHEN 325 MG PO TABS
650.0000 mg | ORAL_TABLET | Freq: Once | ORAL | Status: AC
  Administered 2011-06-04: 650 mg via ORAL

## 2011-06-04 MED ORDER — SODIUM CHLORIDE 0.9 % IV SOLN
Freq: Once | INTRAVENOUS | Status: AC
  Administered 2011-06-04: 11:00:00 via INTRAVENOUS

## 2011-06-04 MED ORDER — SODIUM CHLORIDE 0.9 % IJ SOLN
10.0000 mL | INTRAMUSCULAR | Status: DC | PRN
  Administered 2011-06-04: 10 mL
  Filled 2011-06-04: qty 10

## 2011-06-04 MED ORDER — LORAZEPAM 2 MG/ML IJ SOLN
0.5000 mg | Freq: Once | INTRAMUSCULAR | Status: AC
  Administered 2011-06-04: 0.5 mg via INTRAVENOUS

## 2011-06-04 MED ORDER — HEPARIN SOD (PORK) LOCK FLUSH 100 UNIT/ML IV SOLN
500.0000 [IU] | Freq: Once | INTRAVENOUS | Status: AC | PRN
  Administered 2011-06-04: 500 [IU]
  Filled 2011-06-04: qty 5

## 2011-06-04 MED ORDER — DIPHENHYDRAMINE HCL 25 MG PO CAPS
50.0000 mg | ORAL_CAPSULE | Freq: Once | ORAL | Status: AC
  Administered 2011-06-04: 50 mg via ORAL

## 2011-06-04 NOTE — Progress Notes (Signed)
OFFICE PROGRESS NOTE  CC Dr. Beather Arbour Dr. Cyndia Bent Dr. Juanetta Beets, MD [address] [employer] [address] [place] [address]  DIAGNOSIS: 65 year old female with locally advanced ER negative PR negative HER-2/neu positive right breast cancer.  PRIOR THERAPY:  #1 patient underwent neoadjuvant chemotherapy initially consisting of TCH.  #2 she then had right breast lumpectomy with axillary lymph node dissection that revealed microscopic residual invasive ductal carcinoma nuclear grade 218 lymph nodes were negative for metastatic disease.  #3 patient then went on to receive radiation therapy to the right breast.  #4 patient is now on maintenance Herceptin every 3 weeks.  CURRENT THERAPY: Herceptin Q3 weekly.  INTERVAL HISTORY: Laurie Allen 65 y.o. female returns for followup visit today. Overall she is doing quite well. She does have a little bit of swelling of the left eye. She tells me that she used a new cream that went into her eye and which she woke up with a swollen eye. She has no itching with this although he otherwise she feels fine she denies any fevers chills night sweats headaches shortness of breath chest pains palpitations no myalgias or arthralgias. She has no dominant no pain no peripheral paresthesias no hematuria hematochezia melena hemoptysis or hematemesis. Remainder of the 10 point review of systems is negative.  MEDICAL HISTORY: Past Medical History  Diagnosis Date  . Anxiety   . Cancer     breast right  . GERD (gastroesophageal reflux disease)   . Hx Breast cancer, IDC, Right, Stage II, Receptor -, Her 2 + 06/05/2010  . Rash 06/04/2011    ALLERGIES:  is allergic to compazine.  MEDICATIONS:  Current Outpatient Prescriptions  Medication Sig Dispense Refill  . citalopram (CELEXA) 10 MG tablet Take 1 tablet by mouth Daily.      Marland Kitchen HYDROcodone-acetaminophen (VICODIN) 5-500 MG per tablet Take 1  tablet by mouth every 4 (four) hours as needed.        . lidocaine-prilocaine (EMLA) cream Apply topically as needed.        Marland Kitchen LORazepam (ATIVAN) 0.5 MG tablet Take 0.5 mg by mouth 3 (three) times daily as needed.        . pantoprazole (PROTONIX) 40 MG tablet Take 1 tablet by mouth Daily.      . polyvinyl alcohol (LIQUID TEARS) 1.4 % ophthalmic solution 1 drop as needed.        . promethazine (PHENERGAN) 25 MG suppository Place 25 mg rectally every 6 (six) hours as needed.        . zolpidem (AMBIEN) 5 MG tablet Take 5 mg by mouth at bedtime as needed.        Marland Kitchen DM-APAP-CPM (CORICIDIN HBP FLU PO) Take by mouth daily as needed.          SURGICAL HISTORY:  Past Surgical History  Procedure Date  . Foot surgery   . Bladder surgery   . Mastectomy partial / lumpectomy w/ axillary lymphadenectomy 01/06/2011    Right- Dr Jamey Ripa  . Portacath placement 06/17/2010  . Breast surgery     REVIEW OF SYSTEMS:  Pertinent items are noted in HPI.   PHYSICAL EXAMINATION: General appearance: alert, cooperative, appears stated age and no distress Head: Normocephalic, without obvious abnormality, atraumatic Neck: no adenopathy, no carotid bruit, no JVD, supple, symmetrical, trachea midline and thyroid not enlarged, symmetric, no tenderness/mass/nodules Lymph nodes: Cervical, supraclavicular, and axillary nodes normal. Resp: clear to auscultation bilaterally and normal percussion bilaterally  Back: symmetric, no curvature. ROM normal. No CVA tenderness. Cardio: regular rate and rhythm, S1, S2 normal, no murmur, click, rub or gallop and normal apical impulse GI: soft, non-tender; bowel sounds normal; no masses,  no organomegaly Extremities: extremities normal, atraumatic, no cyanosis or edema Neurologic: Alert and oriented X 3, normal strength and tone. Normal symmetric reflexes. Normal coordination and gait Bilateral breast examination is performed. Right breast reveals barely visible surgical scar from her  recent lumpectomy there are no masses no nipple retraction or discharge. Left breast no masses or nipple discharge. ECOG PERFORMANCE STATUS: 1 - Symptomatic but completely ambulatory  Blood pressure 157/74, pulse 85, temperature 98 F (36.7 C), temperature source Oral, height 5\' 6"  (1.676 m), weight 163 lb 12.8 oz (74.299 kg).  LABORATORY DATA: Lab Results  Component Value Date   WBC 4.0 06/04/2011   HGB 11.2* 06/04/2011   HCT 34.1* 06/04/2011   MCV 92.4 06/04/2011   PLT 202 06/04/2011      Chemistry      Component Value Date/Time   NA 135 05/14/2011 0910   NA 135 05/14/2011 0910   K 4.3 05/14/2011 0910   K 4.3 05/14/2011 0910   CL 101 05/14/2011 0910   CL 101 05/14/2011 0910   CO2 26 05/14/2011 0910   CO2 26 05/14/2011 0910   BUN 21 05/14/2011 0910   BUN 21 05/14/2011 0910   CREATININE 0.92 05/14/2011 0910   CREATININE 0.92 05/14/2011 0910      Component Value Date/Time   CALCIUM 9.7 05/14/2011 0910   CALCIUM 9.7 05/14/2011 0910   ALKPHOS 117 04/23/2011 0840   ALKPHOS 117 04/23/2011 0840   AST 21 04/23/2011 0840   AST 21 04/23/2011 0840   ALT 14 04/23/2011 0840   ALT 14 04/23/2011 0840   BILITOT 0.4 04/23/2011 0840   BILITOT 0.4 04/23/2011 0840       RADIOGRAPHIC STUDIES:  No results found.  ASSESSMENT: 65 year old female with stage II ER negative PR negative HER-2/neu positive right breast cancer status post neoadjuvant chemotherapy which she completed in June 2012. She then underwent right breast lumpectomy with axillary lymph node dissection. He was only found to have residual disease. All 18 lymph nodes were negative for metastatic disease. She then went on to complete radiation therapy with concomitant Herceptin every 3 weeks. Once she completed her radiation therapy she continues to be on Herceptin every 3 weeks to complete out one year of treatment. She continues to have echocardiograms performed her last apical was in September 2012. She will be due for another  echo in January 2013.   PLAN: We will proceed with her scheduled treatment. I will order an echocardiogram for her as well in January. She is recommended to use Benadryl for her left eye swelling. If it doesn't improve or she starts having any other symptoms she is to report to the emergency room. Patient's blood pressure is slightly elevated she will monitor this.   All questions were answered. The patient knows to call the clinic with any problems, questions or concerns. We can certainly see the patient much sooner if necessary.  I spent 25 minutes counseling the patient face to face. The total time spent in the appointment was 30 minutes.    Drue Second, MD Medical/Oncology [facility] [phone] (beeper) [phone] (Office)  06/04/2011, 9:44 AM

## 2011-06-04 NOTE — Patient Instructions (Signed)
1215-Pt discharged ambulatory with next appointment confirmed.  Pt aware to call with any questions or concerns.  

## 2011-06-05 ENCOUNTER — Telehealth: Payer: Self-pay | Admitting: *Deleted

## 2011-06-05 NOTE — Telephone Encounter (Signed)
Pt.notified

## 2011-06-05 NOTE — Telephone Encounter (Signed)
Message copied by Cooper Render on Fri Jun 05, 2011 11:28 AM ------      Message from: Victorino December      Created: Fri Jun 05, 2011  6:19 AM       Call patient:call patient sodium result normal

## 2011-06-16 ENCOUNTER — Other Ambulatory Visit: Payer: Self-pay | Admitting: Oncology

## 2011-06-25 ENCOUNTER — Ambulatory Visit (HOSPITAL_BASED_OUTPATIENT_CLINIC_OR_DEPARTMENT_OTHER): Payer: Medicare Other | Admitting: Physician Assistant

## 2011-06-25 ENCOUNTER — Ambulatory Visit (HOSPITAL_BASED_OUTPATIENT_CLINIC_OR_DEPARTMENT_OTHER): Payer: Medicare Other

## 2011-06-25 ENCOUNTER — Other Ambulatory Visit: Payer: Medicare Other | Admitting: Lab

## 2011-06-25 ENCOUNTER — Encounter: Payer: Self-pay | Admitting: Physician Assistant

## 2011-06-25 ENCOUNTER — Other Ambulatory Visit (HOSPITAL_BASED_OUTPATIENT_CLINIC_OR_DEPARTMENT_OTHER): Payer: Medicare Other | Admitting: Lab

## 2011-06-25 ENCOUNTER — Telehealth: Payer: Self-pay | Admitting: *Deleted

## 2011-06-25 DIAGNOSIS — C50519 Malignant neoplasm of lower-outer quadrant of unspecified female breast: Secondary | ICD-10-CM

## 2011-06-25 DIAGNOSIS — C773 Secondary and unspecified malignant neoplasm of axilla and upper limb lymph nodes: Secondary | ICD-10-CM

## 2011-06-25 DIAGNOSIS — C50919 Malignant neoplasm of unspecified site of unspecified female breast: Secondary | ICD-10-CM

## 2011-06-25 DIAGNOSIS — R000 Tachycardia, unspecified: Secondary | ICD-10-CM

## 2011-06-25 DIAGNOSIS — Z09 Encounter for follow-up examination after completed treatment for conditions other than malignant neoplasm: Secondary | ICD-10-CM

## 2011-06-25 DIAGNOSIS — Z5112 Encounter for antineoplastic immunotherapy: Secondary | ICD-10-CM

## 2011-06-25 LAB — CBC WITH DIFFERENTIAL/PLATELET
BASO%: 0.8 % (ref 0.0–2.0)
EOS%: 1.6 % (ref 0.0–7.0)
MCH: 31 pg (ref 25.1–34.0)
MCHC: 33.4 g/dL (ref 31.5–36.0)
MONO#: 0.4 10*3/uL (ref 0.1–0.9)
RBC: 3.74 10*6/uL (ref 3.70–5.45)
WBC: 5 10*3/uL (ref 3.9–10.3)
lymph#: 0.9 10*3/uL (ref 0.9–3.3)

## 2011-06-25 LAB — BASIC METABOLIC PANEL
CO2: 23 mEq/L (ref 19–32)
Calcium: 9.5 mg/dL (ref 8.4–10.5)
Chloride: 104 mEq/L (ref 96–112)
Sodium: 138 mEq/L (ref 135–145)

## 2011-06-25 MED ORDER — DIPHENHYDRAMINE HCL 25 MG PO CAPS
50.0000 mg | ORAL_CAPSULE | Freq: Once | ORAL | Status: AC
  Administered 2011-06-25: 50 mg via ORAL

## 2011-06-25 MED ORDER — TRASTUZUMAB CHEMO INJECTION 440 MG
6.0000 mg/kg | Freq: Once | INTRAVENOUS | Status: AC
  Administered 2011-06-25: 441 mg via INTRAVENOUS
  Filled 2011-06-25: qty 21

## 2011-06-25 MED ORDER — SODIUM CHLORIDE 0.9 % IJ SOLN
10.0000 mL | INTRAMUSCULAR | Status: DC | PRN
  Administered 2011-06-25: 10 mL
  Filled 2011-06-25: qty 10

## 2011-06-25 MED ORDER — ACETAMINOPHEN 325 MG PO TABS
650.0000 mg | ORAL_TABLET | Freq: Once | ORAL | Status: AC
  Administered 2011-06-25: 650 mg via ORAL

## 2011-06-25 MED ORDER — SODIUM CHLORIDE 0.9 % IV SOLN
Freq: Once | INTRAVENOUS | Status: AC
  Administered 2011-06-25: 12:00:00 via INTRAVENOUS

## 2011-06-25 MED ORDER — LORAZEPAM 2 MG/ML IJ SOLN
0.5000 mg | Freq: Once | INTRAMUSCULAR | Status: AC
  Administered 2011-06-25: 0.5 mg via INTRAVENOUS

## 2011-06-25 MED ORDER — HEPARIN SOD (PORK) LOCK FLUSH 100 UNIT/ML IV SOLN
500.0000 [IU] | Freq: Once | INTRAVENOUS | Status: AC | PRN
  Administered 2011-06-25: 500 [IU]
  Filled 2011-06-25: qty 5

## 2011-06-25 NOTE — Progress Notes (Signed)
Progress note dictated-CTS 

## 2011-06-25 NOTE — Progress Notes (Signed)
CC:   Laurie Allen, M.D. Laurie Allen. Polite, M.D.  DIAGNOSIS:  A 65 year old [place], [place] woman with a history of a locally advanced ER PR negative HER-2 positive right breast carcinoma status post neoadjuvant chemotherapy followed by a right lumpectomy with axillary lymph node dissection revealing microscopic residual disease with 18 negative nodes for metastatic involvement nuclear grade 2.  She completed radiation with ongoing q.3 week maintenance Herceptin, due for her next dose of Herceptin today.  SUBJECTIVE:  Laurie Allen is seen today with her youngest sister in accompaniment who is visiting from [place] for the Christmas holidays. She is feeling quite well.  In fact, she seems even a bit giddy.  She states that she is taking herself off of Protonix, she states that she read the side effect profile and notes that symptoms of dizziness, joint pain, and such seem to worsen when she takes her Protonix, and she states that her heartburn has improved.  She denies any frank nausea issues at this point in time.  No diarrhea or constipation.  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  Review of systems is negative.  ALLERGIES:  Compazine.  CURRENT MEDICATIONS:  As per EMR.  ECOG STATUS:  0.  OBJECTIVE/PHYSICAL EXAM:  Vital signs:  Blood pressure is 123/75, pulse 74, respirations 20, temp 97.6, weight 164 pounds.  HEENT:  Conjunctivae pink.  Sclerae anicteric.  Oropharynx is benign without oral mucositis or candidosis.  No cervical or supraclavicular lymphadenopathy is present on exam.  Lungs:  Clear to auscultation.  No frank evidence to suggest wheezing or rhonchi.  Heart:  Regular rate and rhythm without murmurs, rubs, gallops, or clicks.  Abdomen:  Soft without organomegaly. Normal bowel sounds.  Extremities:  Benign.  Neurologic:  Nonfocal.  The patient is alert and oriented x3.  LABORATORY DATA:  Hemoglobin  11.6 g, platelet count 192,000, WBC 5000 with an ANC of 3500.  IMPRESSION:  A 65 year old [place], [place] woman with a history of a locally advanced ER PR negative HER-2 positive right breast carcinoma status post neoadjuvant chemotherapy followed by right lumpectomy with axillary lymph node dissection with completion of radiation therapy on maintenance q.3 week Herceptin due for next dosing today.  Case has been reviewed with Dr. Donnie Coffin in Dr. Milta Deiters absence.  PLAN:  The patient will receive her Herceptin today as scheduled. Benadryl remains at 50 mg.  Ativan has been added to her premeds. Patient understands and agrees with this plan.   ______________________________ Laurie Nimrod, PA CS/MEDQ  D:  06/25/2011  T:  06/25/2011  Job:  [id number]

## 2011-06-25 NOTE — Telephone Encounter (Signed)
added nancy rudolph to the 07-16-2011 printed out calendar and gave to the patient

## 2011-07-16 ENCOUNTER — Ambulatory Visit (HOSPITAL_BASED_OUTPATIENT_CLINIC_OR_DEPARTMENT_OTHER): Payer: Medicare Other | Admitting: Family

## 2011-07-16 ENCOUNTER — Ambulatory Visit (HOSPITAL_BASED_OUTPATIENT_CLINIC_OR_DEPARTMENT_OTHER): Payer: Medicare Other

## 2011-07-16 ENCOUNTER — Telehealth: Payer: Self-pay

## 2011-07-16 ENCOUNTER — Other Ambulatory Visit: Payer: Medicare Other | Admitting: Lab

## 2011-07-16 ENCOUNTER — Ambulatory Visit: Payer: Medicare Other | Admitting: Physician Assistant

## 2011-07-16 VITALS — BP 119/72 | HR 68 | Temp 98.2°F | Ht 66.0 in | Wt 167.9 lb

## 2011-07-16 DIAGNOSIS — C50519 Malignant neoplasm of lower-outer quadrant of unspecified female breast: Secondary | ICD-10-CM

## 2011-07-16 DIAGNOSIS — C773 Secondary and unspecified malignant neoplasm of axilla and upper limb lymph nodes: Secondary | ICD-10-CM | POA: Diagnosis not present

## 2011-07-16 DIAGNOSIS — K2100 Gastro-esophageal reflux disease with esophagitis, without bleeding: Secondary | ICD-10-CM

## 2011-07-16 DIAGNOSIS — C50911 Malignant neoplasm of unspecified site of right female breast: Secondary | ICD-10-CM

## 2011-07-16 DIAGNOSIS — Z5112 Encounter for antineoplastic immunotherapy: Secondary | ICD-10-CM | POA: Diagnosis not present

## 2011-07-16 DIAGNOSIS — C50919 Malignant neoplasm of unspecified site of unspecified female breast: Secondary | ICD-10-CM

## 2011-07-16 MED ORDER — LORAZEPAM 2 MG/ML IJ SOLN
0.5000 mg | Freq: Once | INTRAMUSCULAR | Status: AC
  Administered 2011-07-16: 0.5 mg via INTRAVENOUS

## 2011-07-16 MED ORDER — TRASTUZUMAB CHEMO INJECTION 440 MG
6.0000 mg/kg | Freq: Once | INTRAVENOUS | Status: AC
  Administered 2011-07-16: 441 mg via INTRAVENOUS
  Filled 2011-07-16: qty 21

## 2011-07-16 MED ORDER — SODIUM CHLORIDE 0.9 % IJ SOLN
10.0000 mL | INTRAMUSCULAR | Status: DC | PRN
  Administered 2011-07-16: 10 mL
  Filled 2011-07-16: qty 10

## 2011-07-16 MED ORDER — ESOMEPRAZOLE MAGNESIUM 40 MG PO CPDR: DELAYED_RELEASE_CAPSULE | ORAL | Status: DC

## 2011-07-16 MED ORDER — ACETAMINOPHEN 325 MG PO TABS
650.0000 mg | ORAL_TABLET | Freq: Once | ORAL | Status: AC
  Administered 2011-07-16: 650 mg via ORAL

## 2011-07-16 MED ORDER — SODIUM CHLORIDE 0.9 % IV SOLN
Freq: Once | INTRAVENOUS | Status: AC
  Administered 2011-07-16: 13:00:00 via INTRAVENOUS

## 2011-07-16 MED ORDER — HEPARIN SOD (PORK) LOCK FLUSH 100 UNIT/ML IV SOLN
500.0000 [IU] | Freq: Once | INTRAVENOUS | Status: AC | PRN
  Administered 2011-07-16: 500 [IU]
  Filled 2011-07-16: qty 5

## 2011-07-16 NOTE — Telephone Encounter (Signed)
gve the pt her feb,march 2013 appt calendar °

## 2011-07-16 NOTE — Patient Instructions (Signed)
1430 Pt ambulatory upon discharge with sister at side.  Verbalized understanding of next appt. And has schedule.

## 2011-07-17 ENCOUNTER — Encounter: Payer: Self-pay | Admitting: Family

## 2011-07-17 NOTE — Progress Notes (Signed)
[facility]  Name: Laurie Allen                  DATE: 07/17/2011 MRN: [id number]                      DOB: 1945-12-18  CC: Katy Apo, MD          REFERRING PHYSICIAN: Katy Apo, MD  DIAGNOSIS: Patient Active Problem List  Diagnoses Date Noted  . Rash 06/04/2011  . Palpitations 02/24/2011  . HTN (hypertension) 02/24/2011  . Hx Breast cancer, IDC, Right, Stage II, Receptor -, Her 2 + 06/05/2010  Stage IIA, ER/PR-, HER2 overexpressed.     Encounter Diagnoses  Name Primary?  . Esophagitis, reflux Yes  . Breast cancer, right breast     PREVIOUS THERAPY: #1 patient underwent neoadjuvant chemotherapy initially consisting of TCH.  #2 Right breast lumpectomy with axillary lymph node dissection that revealed microscopic residual invasive ductal carcinoma nuclear grade 2, 18 lymph nodes negative.  #3 Radiation therapy to the right breast.   CURRENT THERAPY: Maintenance Herceptin every 3 weeks.   INTERIM HISTORY: Tolerating maintenance Herceptin well with no appreciable side effects. No headache or blurred vision, no cough or shortness of breath, no abdominal pain, no new bone pain. Chief complaint is continued severe gastro-esophageal reflux disease. This has been an ongoing issue since receiving chemotherapy.  Last MRI, bilateral, June 2012.  PHYSICAL EXAM: BP 119/72  Pulse 68  Temp(Src) 98.2 F (36.8 C) (Oral)  Ht 5\' 6"  (1.676 m)  Wt 167 lb 14.4 oz (76.159 kg)  BMI 27.10 kg/m2 General: Well developed, well nourished, in no acute distress. Accompanied by her sister in law. EENT: No ocular or oral lesions. No stomatitis.  Respiratory: Lungs are clear to auscultation bilaterally with normal respiratory movement and no accessory muscle use. Cardiac: No murmur, rub or tachycardia. No upper or lower extremity edema.  GI: Abdomen is soft, no palpable hepatosplenomegaly. No fluid wave. No tenderness. Musculoskeletal: No kyphosis, no tenderness over the spine,  ribs or hips. Lymph: No cervical, infraclavicular, axillary or inguinal adenopathy. Neuro: No focal neurological deficits. Psych: Alert and oriented X 3, appropriate mood and affect.   LABORATORY STUDIES:  No labs today. Labs drawn 3 weeks ago show: Results for orders placed in visit on 06/25/11  CBC WITH DIFFERENTIAL      Component Value Range   WBC 5.0  3.9 - 10.3 (10e3/uL)   NEUT# 3.5  1.5 - 6.5 (10e3/uL)   HGB 11.6  11.6 - 15.9 (g/dL)   HCT 91.4 (*) 78.2 - 46.6 (%)   Platelets 192  145 - 400 (10e3/uL)   MCV 92.8  79.5 - 101.0 (fL)   MCH 31.0  25.1 - 34.0 (pg)   MCHC 33.4  31.5 - 36.0 (g/dL)   RBC 9.56  2.13 - 0.86 (10e6/uL)   RDW 14.0  11.2 - 14.5 (%)   lymph# 0.9  0.9 - 3.3 (10e3/uL)   MONO# 0.4  0.1 - 0.9 (10e3/uL)   Eosinophils Absolute 0.1  0.0 - 0.5 (10e3/uL)   Basophils Absolute 0.0  0.0 - 0.1 (10e3/uL)   NEUT% 70.1  38.4 - 76.8 (%)   LYMPH% 19.0  14.0 - 49.7 (%)   MONO% 8.5  0.0 - 14.0 (%)   EOS% 1.6  0.0 - 7.0 (%)   BASO% 0.8  0.0 - 2.0 (%)  BASIC METABOLIC PANEL      Component Value Range  Sodium 138  135 - 145 (mEq/L)   Potassium 4.2  3.5 - 5.3 (mEq/L)   Chloride 104  96 - 112 (mEq/L)   CO2 23  19 - 32 (mEq/L)   Glucose, Bld 82  70 - 99 (mg/dL)   BUN 17  6 - 23 (mg/dL)   Creatinine, Ser 6.21  0.50 - 1.10 (mg/dL)   Calcium 9.5  8.4 - 30.8 (mg/dL)    IMPRESSION:  65 year old female with: 1. History of stage II right breast cancer, now on maintenance Herceptin with good tolerance. 2. Severe reflux, present since chemotherapy.  PLAN:   1. Prescription for Nexium, 40 mg daily. 2. Due for mammogram in June 2013. 3. Return to clinic February 7 4 next treatment an appointment with Dr. Welton Flakes. She will see me on February 28. Due for last treatment on March 21. She will see Dr. Park Breed on that day so Dr. Welton Flakes may outline a surveillance plan for her.

## 2011-07-23 ENCOUNTER — Encounter (INDEPENDENT_AMBULATORY_CARE_PROVIDER_SITE_OTHER): Payer: Self-pay | Admitting: Surgery

## 2011-08-04 ENCOUNTER — Other Ambulatory Visit: Payer: Self-pay | Admitting: Family

## 2011-08-04 DIAGNOSIS — C50919 Malignant neoplasm of unspecified site of unspecified female breast: Secondary | ICD-10-CM

## 2011-08-05 ENCOUNTER — Encounter (INDEPENDENT_AMBULATORY_CARE_PROVIDER_SITE_OTHER): Payer: Self-pay | Admitting: Surgery

## 2011-08-05 ENCOUNTER — Ambulatory Visit (INDEPENDENT_AMBULATORY_CARE_PROVIDER_SITE_OTHER): Payer: Medicare Other | Admitting: Surgery

## 2011-08-05 VITALS — BP 112/70 | HR 72 | Resp 16 | Ht 65.5 in | Wt 166.0 lb

## 2011-08-05 DIAGNOSIS — Z853 Personal history of malignant neoplasm of breast: Secondary | ICD-10-CM | POA: Diagnosis not present

## 2011-08-05 NOTE — Progress Notes (Signed)
NAME: [name]       DOB: 10/02/1945           DATE: 08/05/2011       MRN: [id number]   Laurie Allen is a 66 y.o.Marland Kitchenfemale who presents for routine followup of her Right breast cancer diagnosed in 2011 and treated with neoadjuvant chemo, lumpectomy, radiation. She has no problems or concerns on either side except some chronic right axillary discomfort  PFSH: She has had no significant changes since the last visit here.  ROS: There have been no significant changes since the last visit here  EXAM: General: The patient is alert, oriented, generally healty appearing, NAD. Mood and affect are normal.  Breasts:  Right side shows radiation changes but no evidence of recurrence  Lymphatics: She has no axillary or supraclavicular adenopathy on either side.  Extremities: Full ROM of the surgical side with no lymphedema noted.  Data Reviewed: No new data  Impression: Doing well, with no evidence of recurrent cancer or new cancer  Plan: Will continue to follow up in six months

## 2011-08-06 ENCOUNTER — Ambulatory Visit (HOSPITAL_BASED_OUTPATIENT_CLINIC_OR_DEPARTMENT_OTHER): Payer: Medicare Other | Admitting: Family

## 2011-08-06 ENCOUNTER — Other Ambulatory Visit (HOSPITAL_BASED_OUTPATIENT_CLINIC_OR_DEPARTMENT_OTHER): Payer: Medicare Other | Admitting: Lab

## 2011-08-06 ENCOUNTER — Encounter: Payer: Self-pay | Admitting: Family

## 2011-08-06 ENCOUNTER — Ambulatory Visit (HOSPITAL_BASED_OUTPATIENT_CLINIC_OR_DEPARTMENT_OTHER): Payer: Medicare Other

## 2011-08-06 VITALS — BP 134/70 | HR 80 | Temp 97.7°F | Ht 65.5 in | Wt 166.7 lb

## 2011-08-06 DIAGNOSIS — C50519 Malignant neoplasm of lower-outer quadrant of unspecified female breast: Secondary | ICD-10-CM | POA: Diagnosis not present

## 2011-08-06 DIAGNOSIS — C773 Secondary and unspecified malignant neoplasm of axilla and upper limb lymph nodes: Secondary | ICD-10-CM

## 2011-08-06 DIAGNOSIS — K219 Gastro-esophageal reflux disease without esophagitis: Secondary | ICD-10-CM | POA: Diagnosis not present

## 2011-08-06 DIAGNOSIS — Z5112 Encounter for antineoplastic immunotherapy: Secondary | ICD-10-CM

## 2011-08-06 DIAGNOSIS — C50919 Malignant neoplasm of unspecified site of unspecified female breast: Secondary | ICD-10-CM

## 2011-08-06 DIAGNOSIS — C50911 Malignant neoplasm of unspecified site of right female breast: Secondary | ICD-10-CM

## 2011-08-06 LAB — CBC WITH DIFFERENTIAL/PLATELET
BASO%: 0.5 % (ref 0.0–2.0)
EOS%: 0.8 % (ref 0.0–7.0)
HCT: 37.6 % (ref 34.8–46.6)
LYMPH%: 23.5 % (ref 14.0–49.7)
MCH: 31 pg (ref 25.1–34.0)
MCHC: 33.2 g/dL (ref 31.5–36.0)
MCV: 93.3 fL (ref 79.5–101.0)
NEUT%: 68.7 % (ref 38.4–76.8)
Platelets: 200 10*3/uL (ref 145–400)

## 2011-08-06 LAB — BASIC METABOLIC PANEL
BUN: 22 mg/dL (ref 6–23)
Chloride: 104 mEq/L (ref 96–112)
Creatinine, Ser: 0.94 mg/dL (ref 0.50–1.10)

## 2011-08-06 MED ORDER — TRASTUZUMAB CHEMO INJECTION 440 MG
6.0000 mg/kg | Freq: Once | INTRAVENOUS | Status: AC
  Administered 2011-08-06: 441 mg via INTRAVENOUS
  Filled 2011-08-06: qty 21

## 2011-08-06 MED ORDER — LORAZEPAM 2 MG/ML IJ SOLN
0.5000 mg | Freq: Once | INTRAMUSCULAR | Status: AC
  Administered 2011-08-06: 0.5 mg via INTRAVENOUS

## 2011-08-06 MED ORDER — SODIUM CHLORIDE 0.9 % IV SOLN
Freq: Once | INTRAVENOUS | Status: AC
  Administered 2011-08-06: 10:00:00 via INTRAVENOUS

## 2011-08-06 MED ORDER — ACETAMINOPHEN 325 MG PO TABS
650.0000 mg | ORAL_TABLET | Freq: Once | ORAL | Status: AC
  Administered 2011-08-06: 650 mg via ORAL

## 2011-08-06 MED ORDER — SODIUM CHLORIDE 0.9 % IJ SOLN
10.0000 mL | INTRAMUSCULAR | Status: DC | PRN
  Administered 2011-08-06: 10 mL
  Filled 2011-08-06: qty 10

## 2011-08-06 MED ORDER — HEPARIN SOD (PORK) LOCK FLUSH 100 UNIT/ML IV SOLN
500.0000 [IU] | Freq: Once | INTRAVENOUS | Status: AC | PRN
  Administered 2011-08-06: 500 [IU]
  Filled 2011-08-06: qty 5

## 2011-08-06 NOTE — Progress Notes (Signed)
[facility]  Name: Laurie Allen                  DATE: 08/06/2011 MRN: [id number]                      DOB: 06-18-1946  CC: Katy Apo, MD          REFERRING PHYSICIAN: Katy Apo, MD  DIAGNOSIS: Patient Active Problem List  Diagnoses Date Noted  . Rash 06/04/2011  . Palpitations 02/24/2011  . HTN (hypertension) 02/24/2011  . Hx Breast cancer, IDC, Right, Stage II, Receptor -, Her 2 + 06/05/2010  Stage IIA, ER/PR-, HER2 overexpressed.     Encounter Diagnosis  Name Primary?  . Breast cancer Yes    PREVIOUS THERAPY: #1 patient underwent neoadjuvant chemotherapy initially consisting of TCH.  #2 Right breast lumpectomy with axillary lymph node dissection that revealed microscopic residual invasive ductal carcinoma nuclear grade 2, 18 lymph nodes negative.  #3 Radiation therapy to the right breast.   CURRENT THERAPY: Maintenance Herceptin every 3 weeks.   INTERIM HISTORY: Tolerating maintenance Herceptin well with no appreciable side effects. No headache or blurred vision, no cough or shortness of breath, no abdominal pain, no new bone pain. No lower extremity edema. Chief complaint is continued severe gastro-esophageal reflux disease. This has been an ongoing issue since receiving chemotherapy.  Last MRI, bilateral, June 2012. Last echo 03/04/11, EF 60-65%.  PHYSICAL EXAM: BP 134/70  Pulse 80  Temp(Src) 97.7 F (36.5 C) (Oral)  Ht 5' 5.5" (1.664 m)  Wt 166 lb 11.2 oz (75.615 kg)  BMI 27.32 kg/m2 General: Well developed, well nourished, in no acute distress. Accompanied by her sister in law. EENT: No ocular or oral lesions. No stomatitis.  Respiratory: Lungs are clear to auscultation bilaterally with normal respiratory movement and no accessory muscle use. Cardiac: No murmur, rub or tachycardia. No upper or lower extremity edema.  GI: Abdomen is soft, no palpable hepatosplenomegaly. No fluid wave. No tenderness. Musculoskeletal: No kyphosis, no tenderness  over the spine, ribs or hips. Lymph: No cervical, infraclavicular, axillary or inguinal adenopathy. Neuro: No focal neurological deficits. Psych: Alert and oriented X 3, appropriate mood and affect.   LABORATORY STUDIES:  No labs today. Labs drawn 3 weeks ago show: Results for orders placed in visit on 08/06/11  CBC WITH DIFFERENTIAL      Component Value Range   WBC 3.7 (*) 3.9 - 10.3 (10e3/uL)   NEUT# 2.6  1.5 - 6.5 (10e3/uL)   HGB 12.5  11.6 - 15.9 (g/dL)   HCT 78.2  95.6 - 21.3 (%)   Platelets 200  145 - 400 (10e3/uL)   MCV 93.3  79.5 - 101.0 (fL)   MCH 31.0  25.1 - 34.0 (pg)   MCHC 33.2  31.5 - 36.0 (g/dL)   RBC 0.86  5.78 - 4.69 (10e6/uL)   RDW 13.7  11.2 - 14.5 (%)   lymph# 0.9  0.9 - 3.3 (10e3/uL)   MONO# 0.2  0.1 - 0.9 (10e3/uL)   Eosinophils Absolute 0.0  0.0 - 0.5 (10e3/uL)   Basophils Absolute 0.0  0.0 - 0.1 (10e3/uL)   NEUT% 68.7  38.4 - 76.8 (%)   LYMPH% 23.5  14.0 - 49.7 (%)   MONO% 6.5  0.0 - 14.0 (%)   EOS% 0.8  0.0 - 7.0 (%)   BASO% 0.5  0.0 - 2.0 (%)   nRBC 0  0 - 0 (%)  BASIC  METABOLIC PANEL      Component Value Range   Sodium 138  135 - 145 (mEq/L)   Potassium 4.1  3.5 - 5.3 (mEq/L)   Chloride 104  96 - 112 (mEq/L)   CO2 23  19 - 32 (mEq/L)   Glucose, Bld 102 (*) 70 - 99 (mg/dL)   BUN 22  6 - 23 (mg/dL)   Creatinine, Ser 1.61  0.50 - 1.10 (mg/dL)   Calcium 9.4  8.4 - 09.6 (mg/dL)    IMPRESSION:  66 year old female with: 1. History of stage II right breast cancer, now on maintenance Herceptin with good tolerance. 2. Severe reflux, present since chemotherapy.  PLAN:   1. Due for mammogram in June 2013. 2. Last echo was 9/12. Will order today.  3. Return to clinic February 28 for next treatment and appointment with me.  She will see Dr. Welton Flakes March 21, last treatment. Dr Welton Flakes will outline a surveillance plan for her. 4. Treatment today, Herceptin only. Due to complete 09/17/11.

## 2011-08-10 ENCOUNTER — Telehealth: Payer: Self-pay | Admitting: Oncology

## 2011-08-10 NOTE — Telephone Encounter (Signed)
lmonvm adviisng the pt of her appt with [facility] on 08/24/2011@3 :45pm with dr Clifton James.

## 2011-08-24 ENCOUNTER — Ambulatory Visit (INDEPENDENT_AMBULATORY_CARE_PROVIDER_SITE_OTHER): Payer: Medicare Other | Admitting: Cardiovascular Disease

## 2011-08-24 ENCOUNTER — Encounter: Payer: Self-pay | Admitting: Cardiovascular Disease

## 2011-08-24 VITALS — BP 118/62 | HR 84 | Wt 168.1 lb

## 2011-08-24 DIAGNOSIS — R002 Palpitations: Secondary | ICD-10-CM

## 2011-08-24 NOTE — Progress Notes (Signed)
History of Present Illness: 66 yo AAF with history of HTN, anxiety, GERD, breast cancer s/p XRT and chemotherapy who is referred today for evaluation of cardiac function. She has been seen most recently by Dr. Eden Emms in August of 2012 for palpitations. She has not been taking her Toprol as advised for palpitations but her palpitations are much less frequent than they had been. Her breast cancer was diagnosed in December 2011. She had lumpectomy and lymph node dissection right breast. She has completed XRT and is still receiving chemotherapy.   She tells me today that she has been feeling well. No chest pain, SOB, LE edema, dizziness, near syncope or syncope. She tells me that her visit today is for evaluation for an echo to reassess LV function s/p chemotherapy.   Primary Care Physician: Dr. Renford Dills  Oncologist: Dr. Welton Flakes   Past Medical History  Diagnosis Date  . Anxiety   . Cancer     breast right  . GERD (gastroesophageal reflux disease)   . Hx Breast cancer, IDC, Right, Stage II, Receptor -, Her 2 + 06/05/2010  . Rash 06/04/2011  . Palpitations     Past Surgical History  Procedure Date  . Foot surgery   . Bladder surgery   . Mastectomy partial / lumpectomy w/ axillary lymphadenectomy 01/06/2011    Right- Dr Jamey Ripa  . Portacath placement 06/17/2010  . Breast surgery     Current Outpatient Prescriptions  Medication Sig Dispense Refill  . calcium carbonate (TUMS - DOSED IN MG ELEMENTAL CALCIUM) 500 MG chewable tablet Chew 1 tablet by mouth as needed.      Marland Kitchen DM-APAP-CPM (CORICIDIN HBP FLU PO) Take by mouth daily as needed.        Marland Kitchen HYDROcodone-acetaminophen (VICODIN) 5-500 MG per tablet Take 1 tablet by mouth every 4 (four) hours as needed.        Marland Kitchen LORazepam (ATIVAN) 0.5 MG tablet Take 0.5 mg by mouth 3 (three) times daily as needed.        . metoprolol (TOPROL-XL) 50 MG 24 hr tablet Take 50 mg by mouth daily.        . Multiple Vitamins-Minerals (CERTAVITE  SENIOR/ANTIOXIDANT) TABS Take 1 tablet by mouth daily.      . pantoprazole (PROTONIX) 40 MG tablet Take 80 mg by mouth Daily.      . polyvinyl alcohol (LIQUID TEARS) 1.4 % ophthalmic solution 1 drop as needed.        . promethazine (PHENERGAN) 25 MG suppository Place 25 mg rectally every 6 (six) hours as needed.        . zolpidem (AMBIEN) 5 MG tablet Take 5 mg by mouth at bedtime as needed.        . lidocaine-prilocaine (EMLA) cream Apply topically as needed.          Allergies  Allergen Reactions  . Compazine     History   Social History  . Marital Status: Widowed    Spouse Name: N/A    Number of Children: 4  . Years of Education: N/A   Occupational History  . Retired-customer service    Social History Main Topics  . Smoking status: Never Smoker   . Smokeless tobacco: Never Used  . Alcohol Use: No  . Drug Use: No  . Sexually Active: Not on file   Other Topics Concern  . Not on file   Social History Narrative  . No narrative on file    Family History  Problem Relation  Age of Onset  . Cancer Father 67    ? type    Review of Systems:  As stated in the HPI and otherwise negative.   BP 118/62  Pulse 84  Wt 168 lb 1.9 oz (76.259 kg)  Physical Examination: General: Well developed, well nourished, NAD HEENT: OP clear, mucus membranes moist SKIN: warm, dry. No rashes. Neuro: No focal deficits Musculoskeletal: Muscle strength 5/5 all ext Psychiatric: Mood and affect normal Neck: No JVD, no carotid bruits, no thyromegaly, no lymphadenopathy. Lungs:Clear bilaterally, no wheezes, rhonci, crackles Cardiovascular: Regular rate and rhythm. No murmurs, gallops or rubs. Abdomen:Soft. Bowel sounds present. Non-tender.  Extremities: No lower extremity edema. Pulses are 2 + in the bilateral DP/PT.

## 2011-08-24 NOTE — Assessment & Plan Note (Signed)
Her palpitations are still present but much better. I have encouraged her to resume her Toprol. Will arrange echo to assess LV function s/p chemotherapy.

## 2011-08-24 NOTE — Patient Instructions (Signed)
Your physician wants you to follow-up in: 12 months with Dr Nishan You will receive a reminder letter in the mail two months in advance. If you don't receive a letter, please call our office to schedule the follow-up appointment.   Your physician has requested that you have an echocardiogram. Echocardiography is a painless test that uses sound waves to create images of your heart. It provides your doctor with information about the size and shape of your heart and how well your heart's chambers and valves are working. This procedure takes approximately one hour. There are no restrictions for this procedure.   

## 2011-08-26 ENCOUNTER — Ambulatory Visit: Payer: Medicare Other | Admitting: Family

## 2011-08-27 ENCOUNTER — Other Ambulatory Visit: Payer: Medicare Other | Admitting: Lab

## 2011-08-27 ENCOUNTER — Encounter: Payer: Self-pay | Admitting: Family

## 2011-08-27 ENCOUNTER — Ambulatory Visit (HOSPITAL_BASED_OUTPATIENT_CLINIC_OR_DEPARTMENT_OTHER): Payer: Medicare Other

## 2011-08-27 ENCOUNTER — Ambulatory Visit: Payer: Medicare Other | Admitting: Family

## 2011-08-27 VITALS — BP 128/73 | HR 78 | Temp 97.6°F | Ht 65.5 in | Wt 167.3 lb

## 2011-08-27 DIAGNOSIS — Z5111 Encounter for antineoplastic chemotherapy: Secondary | ICD-10-CM | POA: Diagnosis not present

## 2011-08-27 DIAGNOSIS — C50919 Malignant neoplasm of unspecified site of unspecified female breast: Secondary | ICD-10-CM

## 2011-08-27 LAB — CBC WITH DIFFERENTIAL/PLATELET
BASO%: 0.5 % (ref 0.0–2.0)
EOS%: 1.2 % (ref 0.0–7.0)
MCH: 31 pg (ref 25.1–34.0)
MCHC: 33.2 g/dL (ref 31.5–36.0)
MCV: 93.2 fL (ref 79.5–101.0)
MONO%: 7 % (ref 0.0–14.0)
NEUT#: 2.7 10*3/uL (ref 1.5–6.5)
RBC: 3.81 10*6/uL (ref 3.70–5.45)
RDW: 13.8 % (ref 11.2–14.5)
nRBC: 0 % (ref 0–0)

## 2011-08-27 MED ORDER — LORAZEPAM 2 MG/ML IJ SOLN
0.5000 mg | Freq: Once | INTRAMUSCULAR | Status: AC
  Administered 2011-08-27: 0.5 mg via INTRAVENOUS

## 2011-08-27 MED ORDER — HEPARIN SOD (PORK) LOCK FLUSH 100 UNIT/ML IV SOLN
500.0000 [IU] | Freq: Once | INTRAVENOUS | Status: AC | PRN
  Administered 2011-08-27: 500 [IU]
  Filled 2011-08-27: qty 5

## 2011-08-27 MED ORDER — SODIUM CHLORIDE 0.9 % IJ SOLN
10.0000 mL | INTRAMUSCULAR | Status: DC | PRN
  Administered 2011-08-27: 10 mL
  Filled 2011-08-27: qty 10

## 2011-08-27 MED ORDER — TRASTUZUMAB CHEMO INJECTION 440 MG
6.0000 mg/kg | Freq: Once | INTRAVENOUS | Status: AC
  Administered 2011-08-27: 441 mg via INTRAVENOUS
  Filled 2011-08-27: qty 21

## 2011-08-27 MED ORDER — ACETAMINOPHEN 325 MG PO TABS: 650.0000 mg | ORAL_TABLET | Freq: Once | ORAL | Status: DC

## 2011-08-27 MED ORDER — SODIUM CHLORIDE 0.9 % IV SOLN
Freq: Once | INTRAVENOUS | Status: AC
  Administered 2011-08-27: 11:00:00 via INTRAVENOUS

## 2011-08-28 NOTE — Progress Notes (Signed)
[facility]  Name: Laurie Allen                  DATE: 08/28/2011 MRN: [id number]                      DOB: 1945/08/15  CC: Katy Apo, MD          REFERRING PHYSICIAN: Katy Apo, MD  DIAGNOSIS: Patient Active Problem List  Diagnoses Date Noted  . Rash 06/04/2011  . Palpitations 02/24/2011  . HTN (hypertension) 02/24/2011  . Hx Breast cancer, IDC, Right, Stage II, Receptor -, Her 2 + 06/05/2010  Stage IIA, ER/PR-, HER2 overexpressed.     Encounter Diagnosis  Name Primary?  . Breast cancer Yes    PREVIOUS THERAPY:  1. Neoadjuvant chemotherapy consisting of TCH.  2. Right breast lumpectomy with axillary lymph node dissection that revealed microscopic residual invasive ductal carcinoma, grade 2, 18 lymph nodes negative.  3. Radiation therapy to the right breast.   CURRENT THERAPY: Maintenance Herceptin every 3 weeks.   INTERIM HISTORY: Tolerating maintenance Herceptin well with no appreciable side effects. No headache or blurred vision, no cough or shortness of breath, no abdominal pain, no new bone pain. No lower extremity edema. Chief complaint is continued severe gastro-esophageal reflux disease. This has been an ongoing issue since receiving chemotherapy.  Last MRI, bilateral, June 2012. Last echo 03/04/11, EF 60-65%. Has appt with Dr. Jones Broom 09/02/11 for repeat echo. Has experienced palpitations, has resumed Toprol per primary care. States she has history of low sodium and drinks Pedialyte and "salts foods". I show her lab results where sodium has been in the normal range for months.   Scheduled for last Herceptin treatment 09/17/11. She is happy to complete this part of her therapy.   PHYSICAL EXAM: BP 128/73  Pulse 78  Temp(Src) 97.6 F (36.4 C) (Oral)  Ht 5' 5.5" (1.664 m)  Wt 167 lb 4.8 oz (75.887 kg)  BMI 27.42 kg/m2 General: Well developed, well nourished, in no acute distress. Accompanied by her sister in law. EENT: No ocular or oral  lesions. No stomatitis.  Respiratory: Lungs are clear to auscultation bilaterally with normal respiratory movement and no accessory muscle use. Cardiac: No murmur, rub or tachycardia. No upper or lower extremity edema.  GI: Abdomen is soft, no palpable hepatosplenomegaly. No fluid wave. No tenderness. Musculoskeletal: No kyphosis, no tenderness over the spine, ribs or hips. Lymph: No cervical, infraclavicular, axillary or inguinal adenopathy. Neuro: No focal neurological deficits. Psych: Alert and oriented X 3, appropriate mood and affect.   LABORATORY STUDIES:   Results for orders placed in visit on 08/27/11  CBC WITH DIFFERENTIAL      Component Value Range   WBC 4.1  3.9 - 10.3 (10e3/uL)   NEUT# 2.7  1.5 - 6.5 (10e3/uL)   HGB 11.8  11.6 - 15.9 (g/dL)   HCT 40.9  81.1 - 91.4 (%)   Platelets 172  145 - 400 (10e3/uL)   MCV 93.2  79.5 - 101.0 (fL)   MCH 31.0  25.1 - 34.0 (pg)   MCHC 33.2  31.5 - 36.0 (g/dL)   RBC 7.82  9.56 - 2.13 (10e6/uL)   RDW 13.8  11.2 - 14.5 (%)   lymph# 1.1  0.9 - 3.3 (10e3/uL)   MONO# 0.3  0.1 - 0.9 (10e3/uL)   Eosinophils Absolute 0.1  0.0 - 0.5 (10e3/uL)   Basophils Absolute 0.0  0.0 - 0.1 (10e3/uL)  NEUT% 65.9  38.4 - 76.8 (%)   LYMPH% 25.4  14.0 - 49.7 (%)   MONO% 7.0  0.0 - 14.0 (%)   EOS% 1.2  0.0 - 7.0 (%)   BASO% 0.5  0.0 - 2.0 (%)   nRBC 0  0 - 0 (%)    IMPRESSION:  66 year old female with: 1. History of stage II right breast cancer, now on maintenance Herceptin with good tolerance. 2. Severe reflux, present since chemotherapy.  PLAN:   1. Due for mammogram June 2013. 2. Last echo was 9/12. Ordered on last visit, will be performed 09/02/11.   3. Return to clinic March 21 for last Herceptin treatment and appointment with Dr. Welton Flakes. Dr Welton Flakes will outline a surveillance plan for her. 4. Treatment today, Herceptin only.

## 2011-09-02 ENCOUNTER — Other Ambulatory Visit: Payer: Self-pay

## 2011-09-02 ENCOUNTER — Ambulatory Visit (HOSPITAL_COMMUNITY): Payer: Medicare Other | Attending: Cardiology

## 2011-09-02 DIAGNOSIS — Z9221 Personal history of antineoplastic chemotherapy: Secondary | ICD-10-CM | POA: Insufficient documentation

## 2011-09-02 DIAGNOSIS — C50919 Malignant neoplasm of unspecified site of unspecified female breast: Secondary | ICD-10-CM | POA: Insufficient documentation

## 2011-09-02 DIAGNOSIS — I428 Other cardiomyopathies: Secondary | ICD-10-CM | POA: Diagnosis not present

## 2011-09-02 DIAGNOSIS — I10 Essential (primary) hypertension: Secondary | ICD-10-CM | POA: Insufficient documentation

## 2011-09-02 DIAGNOSIS — R002 Palpitations: Secondary | ICD-10-CM

## 2011-09-17 ENCOUNTER — Telehealth: Payer: Self-pay | Admitting: Oncology

## 2011-09-17 ENCOUNTER — Other Ambulatory Visit (HOSPITAL_BASED_OUTPATIENT_CLINIC_OR_DEPARTMENT_OTHER): Payer: Medicare Other | Admitting: Lab

## 2011-09-17 ENCOUNTER — Ambulatory Visit (HOSPITAL_BASED_OUTPATIENT_CLINIC_OR_DEPARTMENT_OTHER): Payer: Medicare Other | Admitting: Oncology

## 2011-09-17 ENCOUNTER — Encounter: Payer: Self-pay | Admitting: Oncology

## 2011-09-17 ENCOUNTER — Other Ambulatory Visit (INDEPENDENT_AMBULATORY_CARE_PROVIDER_SITE_OTHER): Payer: Self-pay | Admitting: Surgery

## 2011-09-17 ENCOUNTER — Ambulatory Visit (HOSPITAL_BASED_OUTPATIENT_CLINIC_OR_DEPARTMENT_OTHER): Payer: Medicare Other

## 2011-09-17 VITALS — BP 142/77 | HR 85 | Temp 97.8°F | Ht 65.5 in | Wt 166.8 lb

## 2011-09-17 DIAGNOSIS — Z5112 Encounter for antineoplastic immunotherapy: Secondary | ICD-10-CM | POA: Diagnosis not present

## 2011-09-17 DIAGNOSIS — Z171 Estrogen receptor negative status [ER-]: Secondary | ICD-10-CM

## 2011-09-17 DIAGNOSIS — C50911 Malignant neoplasm of unspecified site of right female breast: Secondary | ICD-10-CM

## 2011-09-17 DIAGNOSIS — E559 Vitamin D deficiency, unspecified: Secondary | ICD-10-CM

## 2011-09-17 DIAGNOSIS — C50919 Malignant neoplasm of unspecified site of unspecified female breast: Secondary | ICD-10-CM

## 2011-09-17 DIAGNOSIS — C50519 Malignant neoplasm of lower-outer quadrant of unspecified female breast: Secondary | ICD-10-CM | POA: Diagnosis not present

## 2011-09-17 LAB — BASIC METABOLIC PANEL
BUN: 14 mg/dL (ref 6–23)
CO2: 22 mEq/L (ref 19–32)
Chloride: 99 mEq/L (ref 96–112)
Creatinine, Ser: 0.92 mg/dL (ref 0.50–1.10)

## 2011-09-17 LAB — CBC WITH DIFFERENTIAL/PLATELET
Basophils Absolute: 0 10*3/uL (ref 0.0–0.1)
Eosinophils Absolute: 0.1 10*3/uL (ref 0.0–0.5)
HCT: 34.4 % — ABNORMAL LOW (ref 34.8–46.6)
HGB: 11.8 g/dL (ref 11.6–15.9)
LYMPH%: 17.1 % (ref 14.0–49.7)
MONO#: 0.5 10*3/uL (ref 0.1–0.9)
NEUT#: 3.3 10*3/uL (ref 1.5–6.5)
NEUT%: 71.2 % (ref 38.4–76.8)
Platelets: 209 10*3/uL (ref 145–400)
WBC: 4.6 10*3/uL (ref 3.9–10.3)
nRBC: 0 % (ref 0–0)

## 2011-09-17 MED ORDER — LORAZEPAM 2 MG/ML IJ SOLN
0.5000 mg | Freq: Once | INTRAMUSCULAR | Status: AC
  Administered 2011-09-17: 0.5 mg via INTRAVENOUS

## 2011-09-17 MED ORDER — ACETAMINOPHEN 325 MG PO TABS
650.0000 mg | ORAL_TABLET | Freq: Once | ORAL | Status: AC
  Administered 2011-09-17: 650 mg via ORAL

## 2011-09-17 MED ORDER — HEPARIN SOD (PORK) LOCK FLUSH 100 UNIT/ML IV SOLN
500.0000 [IU] | Freq: Once | INTRAVENOUS | Status: AC | PRN
  Administered 2011-09-17: 500 [IU]
  Filled 2011-09-17: qty 5

## 2011-09-17 MED ORDER — SODIUM CHLORIDE 0.9 % IJ SOLN
10.0000 mL | INTRAMUSCULAR | Status: DC | PRN
  Administered 2011-09-17: 10 mL
  Filled 2011-09-17: qty 10

## 2011-09-17 MED ORDER — SODIUM CHLORIDE 0.9 % IV SOLN
Freq: Once | INTRAVENOUS | Status: AC
  Administered 2011-09-17: 10:00:00 via INTRAVENOUS

## 2011-09-17 MED ORDER — TRASTUZUMAB CHEMO INJECTION 440 MG
6.0000 mg/kg | Freq: Once | INTRAVENOUS | Status: AC
  Administered 2011-09-17: 441 mg via INTRAVENOUS
  Filled 2011-09-17: qty 21

## 2011-09-17 NOTE — Telephone Encounter (Signed)
gve the pt her June 2013 appt calendar along with the appt to see dr Jamey Ripa for the port removal and the mammo appt

## 2011-09-17 NOTE — Progress Notes (Signed)
OFFICE PROGRESS NOTE  CC Dr. Beather Arbour Dr. Cyndia Bent Dr. Juanetta Beets, MD, MD [address] [place] [address]  DIAGNOSIS: 66 year old female with locally advanced ER negative PR negative HER-2/neu positive right breast cancer.  PRIOR THERAPY:  #1 patient underwent neoadjuvant chemotherapy initially consisting of TCH.  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  #3 patient then went on to receive radiation therapy to the right breast.  #4 patient is now on maintenance Herceptin every 3 weeks. For one year completed on 09/17/11  CURRENT THERAPY: observation and surveillance  INTERVAL HISTORY: Laurie Allen 66 y.o. female returns for followup visit today.Overall patient is doing well. She has now been without any problems. No fevers or chills, no nausea or vomiting. Still continues to have acid reflex. No shortness of breath or chest pains.Remainder of the 10 point review of systems is negative.  MEDICAL HISTORY: Past Medical History  Diagnosis Date  . Anxiety   . GERD (gastroesophageal reflux disease)   . Hx Breast cancer, IDC, Right, Stage II, Receptor -, Her 2 + 06/05/2010  . Rash 06/04/2011  . Palpitations   . Breast cancer     ALLERGIES:  is allergic to compazine.  MEDICATIONS:  Current Outpatient Prescriptions  Medication Sig Dispense Refill  . calcium carbonate (TUMS - DOSED IN MG ELEMENTAL CALCIUM) 500 MG chewable tablet Chew 1 tablet by mouth as needed.      Marland Kitchen DM-APAP-CPM (CORICIDIN HBP FLU PO) Take by mouth daily as needed.        Marland Kitchen HYDROcodone-acetaminophen (VICODIN) 5-500 MG per tablet Take 1 tablet by mouth every 4 (four) hours as needed.        . lidocaine-prilocaine (EMLA) cream Apply topically as needed.        Marland Kitchen LORazepam (ATIVAN) 0.5 MG tablet Take 0.5 mg  by mouth 3 (three) times daily as needed.        . metoprolol (TOPROL-XL) 50 MG 24 hr tablet Take 50 mg by mouth daily.        . Multiple Vitamins-Minerals (CERTAVITE SENIOR/ANTIOXIDANT) TABS Take 1 tablet by mouth daily.      . pantoprazole (PROTONIX) 40 MG tablet Take 80 mg by mouth Daily.      . polyvinyl alcohol (LIQUID TEARS) 1.4 % ophthalmic solution 1 drop as needed.        . promethazine (PHENERGAN) 25 MG suppository Place 25 mg rectally every 6 (six) hours as needed.        . zolpidem (AMBIEN) 5 MG tablet Take 5 mg by mouth at bedtime as needed.          SURGICAL HISTORY:  Past Surgical History  Procedure Date  . Foot surgery   . Bladder surgery   . Mastectomy partial / lumpectomy w/ axillary lymphadenectomy 01/06/2011    Right- Dr Jamey Ripa  . Portacath placement 06/17/2010  . Breast surgery     REVIEW OF SYSTEMS:  Pertinent items are noted in HPI.   PHYSICAL EXAMINATION: General appearance: alert, cooperative, appears stated age and no distress Head: Normocephalic, without obvious abnormality, atraumatic Neck: no adenopathy, no carotid bruit, no JVD, supple, symmetrical, trachea midline and thyroid not enlarged, symmetric, no tenderness/mass/nodules Lymph nodes: Cervical, supraclavicular, and axillary nodes normal. Resp: clear to auscultation bilaterally and normal percussion bilaterally Back: symmetric, no curvature. ROM  normal. No CVA tenderness. Cardio: regular rate and rhythm, S1, S2 normal, no murmur, click, rub or gallop and normal apical impulse GI: soft, non-tender; bowel sounds normal; no masses,  no organomegaly Extremities: extremities normal, atraumatic, no cyanosis or edema Neurologic: Alert and oriented X 3, normal strength and tone. Normal symmetric reflexes. Normal coordination and gait Bilateral breast examination is performed. Right breast reveals barely visible surgical scar from her recent lumpectomy there are no masses no nipple retraction or discharge.  Left breast no masses or nipple discharge. ECOG PERFORMANCE STATUS: 1 - Symptomatic but completely ambulatory  Blood pressure 142/77, pulse 85, temperature 97.8 F (36.6 C), temperature source Oral, height 5' 5.5" (1.664 m), weight 166 lb 12.8 oz (75.66 kg).  LABORATORY DATA: Lab Results  Component Value Date   WBC 4.6 09/17/2011   HGB 11.8 09/17/2011   HCT 34.4* 09/17/2011   MCV 90.5 09/17/2011   PLT 209 09/17/2011      Chemistry      Component Value Date/Time   NA 138 08/06/2011 0855   K 4.1 08/06/2011 0855   CL 104 08/06/2011 0855   CO2 23 08/06/2011 0855   BUN 22 08/06/2011 0855   CREATININE 0.94 08/06/2011 0855      Component Value Date/Time   CALCIUM 9.4 08/06/2011 0855   ALKPHOS 137* 06/04/2011 0856   AST 20 06/04/2011 0856   ALT 12 06/04/2011 0856   BILITOT 0.4 06/04/2011 0856       RADIOGRAPHIC STUDIES:  No results found.  ASSESSMENT: 66 year old female with:  1.  stage II ER negative PR negative HER-2/neu positive right breast cancer status post neoadjuvant chemotherapy which she completed in June 2012. She then underwent right breast lumpectomy with axillary lymph node dissection. He was only found to have residual disease. All 18 lymph nodes were negative for metastatic disease.  2.  She then went on to complete radiation therapy with concomitant Herceptin every 3 weeks. Once she completed her radiation therapy  3.  she continues to be on Herceptin every 3 weeks to complete out one year of treatment. She today will complete her 1 year of herceptin  4. Will need mammograms and i will get her set up.  5. She does not need any surveillance scans on a routine basis  6. Will have port a cath removed and referral made to Dr. Jamey Ripa for this    PLAN:   1.We will proceed with her scheduled treatment.  2. Mammogram in the next few weeks as new baseline.  3. She will follow up with me in 4 months for the first 2 years.  4. Port removal  5. All questions answered.  All  questions were answered. The patient knows to call the clinic with any problems, questions or concerns. We can certainly see the patient much sooner if necessary.  I spent 25 minutes counseling the patient face to face. The total time spent in the appointment was 30 minutes.    Drue Second, MD Medical/Oncology [facility] [phone] (beeper) [phone] (Office)  09/17/2011, 9:16 AM

## 2011-09-17 NOTE — Patient Instructions (Signed)
1. Congratulations you are done with the treatments  2. Refer to Dr. Jamey Ripa for port removal  3. Mammogram scheduled  4. Call with any problems or questions at [phone] and ask for my nurse.  6. I will see you back in June for follow up.

## 2011-09-24 MED ORDER — MORPHINE SULFATE (PF) 1 MG/ML IV SOLN
INTRAVENOUS | Status: AC
  Filled 2011-09-24: qty 25

## 2011-09-28 ENCOUNTER — Encounter (INDEPENDENT_AMBULATORY_CARE_PROVIDER_SITE_OTHER): Payer: Medicare Other | Admitting: Surgery

## 2011-10-05 DIAGNOSIS — F411 Generalized anxiety disorder: Secondary | ICD-10-CM | POA: Diagnosis not present

## 2011-10-05 DIAGNOSIS — G479 Sleep disorder, unspecified: Secondary | ICD-10-CM | POA: Diagnosis not present

## 2011-10-05 DIAGNOSIS — F3289 Other specified depressive episodes: Secondary | ICD-10-CM | POA: Diagnosis not present

## 2011-10-05 DIAGNOSIS — F329 Major depressive disorder, single episode, unspecified: Secondary | ICD-10-CM | POA: Diagnosis not present

## 2011-10-05 DIAGNOSIS — K219 Gastro-esophageal reflux disease without esophagitis: Secondary | ICD-10-CM | POA: Diagnosis not present

## 2011-10-08 ENCOUNTER — Encounter (INDEPENDENT_AMBULATORY_CARE_PROVIDER_SITE_OTHER): Payer: Medicare Other | Admitting: Surgery

## 2011-10-09 ENCOUNTER — Other Ambulatory Visit: Payer: Self-pay | Admitting: Oncology

## 2011-10-12 ENCOUNTER — Encounter (HOSPITAL_BASED_OUTPATIENT_CLINIC_OR_DEPARTMENT_OTHER): Payer: Self-pay | Admitting: Anesthesiology

## 2011-10-12 ENCOUNTER — Encounter (HOSPITAL_BASED_OUTPATIENT_CLINIC_OR_DEPARTMENT_OTHER)
Admission: RE | Disposition: A | Discharge status: Home / Self Care | Payer: Self-pay | Source: Ambulatory Visit | Attending: Surgery

## 2011-10-12 ENCOUNTER — Ambulatory Visit (HOSPITAL_BASED_OUTPATIENT_CLINIC_OR_DEPARTMENT_OTHER)
Admission: RE | Admit: 2011-10-12 | Discharge: 2011-10-12 | Disposition: A | Discharge status: Home / Self Care | Payer: Medicare Other | Source: Ambulatory Visit | Attending: Surgery | Admitting: Surgery

## 2011-10-12 ENCOUNTER — Encounter (HOSPITAL_BASED_OUTPATIENT_CLINIC_OR_DEPARTMENT_OTHER): Payer: Self-pay

## 2011-10-12 ENCOUNTER — Encounter (HOSPITAL_BASED_OUTPATIENT_CLINIC_OR_DEPARTMENT_OTHER): Payer: Self-pay | Admitting: *Deleted

## 2011-10-12 ENCOUNTER — Ambulatory Visit (HOSPITAL_BASED_OUTPATIENT_CLINIC_OR_DEPARTMENT_OTHER): Payer: Medicare Other | Admitting: Anesthesiology

## 2011-10-12 DIAGNOSIS — K219 Gastro-esophageal reflux disease without esophagitis: Secondary | ICD-10-CM | POA: Insufficient documentation

## 2011-10-12 DIAGNOSIS — C50919 Malignant neoplasm of unspecified site of unspecified female breast: Secondary | ICD-10-CM | POA: Diagnosis not present

## 2011-10-12 DIAGNOSIS — Z79899 Other long term (current) drug therapy: Secondary | ICD-10-CM | POA: Insufficient documentation

## 2011-10-12 DIAGNOSIS — Z452 Encounter for adjustment and management of vascular access device: Secondary | ICD-10-CM

## 2011-10-12 DIAGNOSIS — I10 Essential (primary) hypertension: Secondary | ICD-10-CM | POA: Diagnosis not present

## 2011-10-12 HISTORY — PX: PORT-A-CATH REMOVAL: SHX5289

## 2011-10-12 SURGERY — REMOVAL PORT-A-CATH
Anesthesia: Monitor Anesthesia Care | Site: Chest | Laterality: Right | Wound class: Clean

## 2011-10-12 MED ORDER — FENTANYL CITRATE 0.05 MG/ML IJ SOLN
INTRAMUSCULAR | Status: DC | PRN
  Administered 2011-10-12: 50 ug via INTRAVENOUS

## 2011-10-12 MED ORDER — ONDANSETRON HCL 4 MG/2ML IJ SOLN
INTRAMUSCULAR | Status: DC | PRN
  Administered 2011-10-12: 4 mg via INTRAVENOUS

## 2011-10-12 MED ORDER — PROPOFOL 10 MG/ML IV EMUL
INTRAVENOUS | Status: DC | PRN
  Administered 2011-10-12: 10 mg via INTRAVENOUS
  Administered 2011-10-12 (×2): 20 mg via INTRAVENOUS

## 2011-10-12 MED ORDER — BUPIVACAINE HCL (PF) 0.5 % IJ SOLN
INTRAMUSCULAR | Status: DC | PRN
  Administered 2011-10-12: 11 mL

## 2011-10-12 MED ORDER — METOCLOPRAMIDE HCL 5 MG/ML IJ SOLN: 10.0000 mg | Freq: Once | INTRAMUSCULAR | Status: DC | PRN

## 2011-10-12 MED ORDER — FENTANYL CITRATE 0.05 MG/ML IJ SOLN: 25.0000 ug | INTRAMUSCULAR | Status: DC | PRN

## 2011-10-12 MED ORDER — LIDOCAINE-EPINEPHRINE (PF) 1 %-1:200000 IJ SOLN
INTRAMUSCULAR | Status: DC | PRN
  Administered 2011-10-12: 11 mL

## 2011-10-12 MED ORDER — LACTATED RINGERS IV SOLN
INTRAVENOUS | Status: DC
  Administered 2011-10-12: 09:00:00 via INTRAVENOUS

## 2011-10-12 SURGICAL SUPPLY — 30 items
ADH SKN CLS APL DERMABOND .7 (GAUZE/BANDAGES/DRESSINGS) ×2
APL SKNCLS STERI-STRIP NONHPOA (GAUZE/BANDAGES/DRESSINGS)
BENZOIN TINCTURE PRP APPL 2/3 (GAUZE/BANDAGES/DRESSINGS) IMPLANT
BLADE SURG 15 STRL LF DISP TIS (BLADE) ×2 IMPLANT
BLADE SURG 15 STRL SS (BLADE) ×3
CHLORAPREP W/TINT 26ML (MISCELLANEOUS) ×4 IMPLANT
CLOTH BEACON ORANGE TIMEOUT ST (SAFETY) ×1 IMPLANT
DERMABOND ADVANCED (GAUZE/BANDAGES/DRESSINGS) ×1
DERMABOND ADVANCED .7 DNX12 (GAUZE/BANDAGES/DRESSINGS) ×1 IMPLANT
DRSG TEGADERM 4X4.75 (GAUZE/BANDAGES/DRESSINGS) IMPLANT
ELECT REM PT RETURN 9FT ADLT (ELECTROSURGICAL)
ELECTRODE REM PT RTRN 9FT ADLT (ELECTROSURGICAL) IMPLANT
GAUZE SPONGE 4X4 12PLY STRL LF (GAUZE/BANDAGES/DRESSINGS) IMPLANT
GAUZE SPONGE 4X4 16PLY XRAY LF (GAUZE/BANDAGES/DRESSINGS) IMPLANT
GLOVE ECLIPSE 6.5 STRL STRAW (GLOVE) ×2 IMPLANT
GLOVE EUDERMIC 7 POWDERFREE (GLOVE) ×3 IMPLANT
MARKER SKIN DUAL TIP RULER LAB (MISCELLANEOUS) ×3 IMPLANT
NDL HYPO 25X1 1.5 SAFETY (NEEDLE) ×1 IMPLANT
NDL SAFETY ECLIPSE 18X1.5 (NEEDLE) ×1 IMPLANT
NEEDLE HYPO 18GX1.5 SHARP (NEEDLE)
NEEDLE HYPO 25X1 1.5 SAFETY (NEEDLE) ×3 IMPLANT
PENCIL BUTTON HOLSTER BLD 10FT (ELECTRODE) IMPLANT
STRIP CLOSURE SKIN 1/2X4 (GAUZE/BANDAGES/DRESSINGS) IMPLANT
SUT MNCRL AB 4-0 PS2 18 (SUTURE) ×3 IMPLANT
SUT VIC AB 3-0 FS2 27 (SUTURE) IMPLANT
SUT VIC AB 4-0 BRD 54 (SUTURE) IMPLANT
SUT VIC AB 4-0 P-3 18XBRD (SUTURE) IMPLANT
SUT VIC AB 4-0 P3 18 (SUTURE)
SUT VIC AB 4-0 SH 18 (SUTURE) ×2 IMPLANT
SYR CONTROL 10ML LL (SYRINGE) ×3 IMPLANT

## 2011-10-12 NOTE — H&P (Signed)
  Chief complaint: Unneeded Port-A-Cath  History of present illness: This patient has completed all of her chemotherapy for her breast cancer and the oncologist has told her the port can come out. She presents to the surgical Center today for port removal. She is decided to have some IV sedation for the procedure.  Medications: Current Facility-Administered Medications  Medication Dose Route Frequency Provider Last Rate Last Dose  . lactated ringers infusion   Intravenous Continuous Hart Robinsons, MD       Allergies: Allergies  Allergen Reactions  . Compazine    Past surgical history: Past Surgical History  Procedure Date  . Foot surgery   . Bladder surgery   . Mastectomy partial / lumpectomy w/ axillary lymphadenectomy 01/06/2011    Right- Dr Jamey Ripa  . Portacath placement 06/17/2010  . Breast surgery    Exam: Vital signs:BP 138/81  Pulse 77  Temp(Src) 97.8 F (36.6 C) (Oral)  Resp 22  SpO2 100% General: Patient alert oriented and healthy-appearing Lungs: Normal respirations clear auscultation Heart: Regular rhythm with no murmurs rubs or gallops.  Skin: There is a port over the right anterior chest wall. No evidence of infection.  Impression: Unneeded Port-A-Cath  Plan: Removal with IV sedation. The patient has no further questions and understands the procedure risks and complications.

## 2011-10-12 NOTE — Anesthesia Postprocedure Evaluation (Signed)
Anesthesia Post Note  Patient: Laurie Allen  Procedure(s) Performed: Procedure(s) (LRB): REMOVAL PORT-A-CATH (Right)  Anesthesia type: MAC  Patient location: PACU  Post pain: Pain level controlled  Post assessment: Patient's Cardiovascular Status Stable  Last Vitals:  Filed Vitals:   10/12/11 0945  BP: 125/62  Pulse: 79  Temp:   Resp: 18    Post vital signs: Reviewed and stable  Level of consciousness: alert  Complications: No apparent anesthesia complications

## 2011-10-12 NOTE — Op Note (Signed)
Laurie Allen [birth year] [id number] 09/17/2011  Preoperative diagnosis: Un-Needed PAC  Postoperative diagnosis: Same  Procedure: Portacath Removal  Surgeon: Currie Paris, MD, FACS  Anesthesia:MAC   Clinical History and Indications: The patient has finished her chemotherapy and no longer needs a port. She wishes to have it removed.  Procedure: The patient was seen in the preoperative area and we confirmed the plans for the procedure as noted above. The Port-A-Cath site was identified and marked. The patient had no further questions.  The patient was then taken into the procedure room. The timeout was done. IV sedation was obtained by anesthesia. The area was prepped and draped.The area over the Port-A-Cath was anesthetized with A combination of 1% Xylocaine with epinephrine and 0.5% Marcaine plain The old scar was opened. The capsule around the port opened and the port identified. The holding sutures were cut. The catheter was backed partially out of its tract. A figure 8 3-0 Vicryl suture was placed, the tubing removed, and the suture tied down to prevent backbleeding.  The port was then removed from its pocket. I made sure everything was dry. The incision was closed with 3-0 Vicryl, 4-0 Monocryl subcuticular, and Dermabond.  The patient tolerated the procedure well. There were no complications.  Currie Paris, MD, FACS 10/12/2011 9:06 AM

## 2011-10-12 NOTE — Anesthesia Preprocedure Evaluation (Signed)
Anesthesia Evaluation  Patient identified by MRN, date of birth, ID band Patient awake    Reviewed: Allergy & Precautions, H&P , NPO status , Patient's Chart, lab work & pertinent test results, reviewed documented beta blocker date and time   Airway Mallampati: II TM Distance: >3 FB Neck ROM: full    Dental   Pulmonary neg pulmonary ROS,          Cardiovascular hypertension, On Medications and On Home Beta Blockers     Neuro/Psych PSYCHIATRIC DISORDERS negative neurological ROS     GI/Hepatic Neg liver ROS, GERD-  Medicated and Controlled,  Endo/Other  negative endocrine ROS  Renal/GU negative Renal ROS  negative genitourinary   Musculoskeletal   Abdominal   Peds  Hematology negative hematology ROS (+)   Anesthesia Other Findings See surgeon's H&P   Reproductive/Obstetrics negative OB ROS                           Anesthesia Physical Anesthesia Plan  ASA: II  Anesthesia Plan: MAC   Post-op Pain Management:    Induction: Intravenous  Airway Management Planned: Simple Face Mask  Additional Equipment:   Intra-op Plan:   Post-operative Plan:   Informed Consent: I have reviewed the patients History and Physical, chart, labs and discussed the procedure including the risks, benefits and alternatives for the proposed anesthesia with the patient or authorized representative who has indicated his/her understanding and acceptance.     Plan Discussed with: CRNA and Surgeon  Anesthesia Plan Comments:         Anesthesia Quick Evaluation

## 2011-10-12 NOTE — Transfer of Care (Signed)
Immediate Anesthesia Transfer of Care Note  Patient: Laurie Allen  Procedure(s) Performed: Procedure(s) (LRB): REMOVAL PORT-A-CATH (Right)  Patient Location: PACU  Anesthesia Type: MAC  Level of Consciousness: awake, alert  and oriented  Airway & Oxygen Therapy: Patient Spontanous Breathing and Patient connected to face mask oxygen  Post-op Assessment: Report given to PACU RN and Post -op Vital signs reviewed and stable  Post vital signs: Reviewed and stable  Complications: No apparent anesthesia complications

## 2011-10-12 NOTE — Discharge Instructions (Addendum)
You may resume normal activities. You  may shower this afternoon or this evening.If you have any problems or concerns call the office.All of the sutures dissolve so there are none that we need to take out  . Post Anesthesia Home Care Instructions  Activity: Get plenty of rest for the remainder of the day. A responsible adult should stay with you for 24 hours following the procedure.  For the next 24 hours, DO NOT: -Drive a car -Advertising copywriter -Drink alcoholic beverages -Take any medication unless instructed by your physician -Make any legal decisions or sign important papers.  Meals: Start with liquid foods such as gelatin or soup. Progress to regular foods as tolerated. Avoid greasy, spicy, heavy foods. If nausea and/or vomiting occur, drink only clear liquids until the nausea and/or vomiting subsides. Call your physician if vomiting continues.  Special Instructions/Symptoms: Your throat may feel dry or sore from the anesthesia or the breathing tube placed in your throat during surgery. If this causes discomfort, gargle with warm salt water. The discomfort should disappear within 24 hours.

## 2011-10-13 ENCOUNTER — Encounter (HOSPITAL_BASED_OUTPATIENT_CLINIC_OR_DEPARTMENT_OTHER): Payer: Self-pay | Admitting: Surgery

## 2011-10-16 ENCOUNTER — Encounter (INDEPENDENT_AMBULATORY_CARE_PROVIDER_SITE_OTHER): Payer: Self-pay | Admitting: Surgery

## 2011-11-02 ENCOUNTER — Telehealth: Payer: Self-pay | Admitting: *Deleted

## 2011-11-02 NOTE — Telephone Encounter (Signed)
Patient called stating she has developed a new rash on her back,not sure if it was radiation related, last follow up visit 04/2011, last rad tx in June 2012, asked if she had changed any soaps,laudry detergents, new foods, was on any chemotherapy<p[atien stated last Hercepetin was in [facility] 2013, thinks her daughter did change laundry detergent recently, suggested to go back to her  normal laundry detergent  And cortiosone over the counter may help with itching, if rash doesn't improve in afew days to call her Primary Md

## 2011-11-26 DIAGNOSIS — R002 Palpitations: Secondary | ICD-10-CM | POA: Diagnosis not present

## 2011-11-26 DIAGNOSIS — K209 Esophagitis, unspecified without bleeding: Secondary | ICD-10-CM | POA: Diagnosis not present

## 2011-11-26 DIAGNOSIS — R109 Unspecified abdominal pain: Secondary | ICD-10-CM | POA: Diagnosis not present

## 2011-12-16 ENCOUNTER — Telehealth: Payer: Self-pay | Admitting: *Deleted

## 2011-12-16 NOTE — Telephone Encounter (Signed)
Please add U/A to her labs

## 2011-12-16 NOTE — Telephone Encounter (Signed)
Pt called states " I've never had nobody to check my urine and because I'm having this bad acid reflux, I just want Dr. Welton Flakes to check the acid in my urine." Pt advised she recently went to her PCP who gave her rx for carafate to help with Acid reflux. Pt advised her PCP told her to take it for 2 months to see if it helps. Pt advised she does not think it is helping as she "has a lot of acid" Discussed with pt I will forward her concerns to MD for review. Reviewed foods to avoid like soda, caffine, chocolate and fried foods as this can worsen symptoms. Pt verbalized understanding. F/u with Dr. Welton Flakes 1030am on 6/21 - lab at 10

## 2011-12-17 ENCOUNTER — Other Ambulatory Visit: Payer: Self-pay | Admitting: *Deleted

## 2011-12-18 ENCOUNTER — Ambulatory Visit (HOSPITAL_BASED_OUTPATIENT_CLINIC_OR_DEPARTMENT_OTHER): Payer: Medicare Other

## 2011-12-18 ENCOUNTER — Telehealth: Payer: Self-pay | Admitting: Oncology

## 2011-12-18 ENCOUNTER — Ambulatory Visit (HOSPITAL_BASED_OUTPATIENT_CLINIC_OR_DEPARTMENT_OTHER): Payer: Medicare Other | Admitting: Oncology

## 2011-12-18 ENCOUNTER — Telehealth: Payer: Self-pay | Admitting: *Deleted

## 2011-12-18 ENCOUNTER — Ambulatory Visit: Payer: Medicare Other | Admitting: Oncology

## 2011-12-18 ENCOUNTER — Other Ambulatory Visit: Payer: Medicare Other | Admitting: Lab

## 2011-12-18 ENCOUNTER — Encounter: Payer: Self-pay | Admitting: Oncology

## 2011-12-18 ENCOUNTER — Other Ambulatory Visit (HOSPITAL_BASED_OUTPATIENT_CLINIC_OR_DEPARTMENT_OTHER): Payer: Medicare Other | Admitting: Lab

## 2011-12-18 VITALS — BP 129/76 | HR 73 | Temp 98.4°F | Wt 159.3 lb

## 2011-12-18 DIAGNOSIS — C50519 Malignant neoplasm of lower-outer quadrant of unspecified female breast: Secondary | ICD-10-CM

## 2011-12-18 DIAGNOSIS — K219 Gastro-esophageal reflux disease without esophagitis: Secondary | ICD-10-CM

## 2011-12-18 DIAGNOSIS — N63 Unspecified lump in unspecified breast: Secondary | ICD-10-CM

## 2011-12-18 DIAGNOSIS — E559 Vitamin D deficiency, unspecified: Secondary | ICD-10-CM | POA: Diagnosis not present

## 2011-12-18 DIAGNOSIS — Z5112 Encounter for antineoplastic immunotherapy: Secondary | ICD-10-CM

## 2011-12-18 DIAGNOSIS — Z171 Estrogen receptor negative status [ER-]: Secondary | ICD-10-CM | POA: Diagnosis not present

## 2011-12-18 DIAGNOSIS — C50919 Malignant neoplasm of unspecified site of unspecified female breast: Secondary | ICD-10-CM

## 2011-12-18 DIAGNOSIS — C773 Secondary and unspecified malignant neoplasm of axilla and upper limb lymph nodes: Secondary | ICD-10-CM | POA: Diagnosis not present

## 2011-12-18 DIAGNOSIS — Z87440 Personal history of urinary (tract) infections: Secondary | ICD-10-CM

## 2011-12-18 HISTORY — DX: Gastro-esophageal reflux disease without esophagitis: K21.9

## 2011-12-18 LAB — URINALYSIS, MICROSCOPIC - CHCC
Ketones: NEGATIVE mg/dL
Nitrite: NEGATIVE
Protein: NEGATIVE mg/dL
pH: 6 (ref 4.6–8.0)

## 2011-12-18 LAB — CBC WITH DIFFERENTIAL/PLATELET
BASO%: 0.6 % (ref 0.0–2.0)
EOS%: 0.8 % (ref 0.0–7.0)
MCH: 31.4 pg (ref 25.1–34.0)
MCHC: 33.4 g/dL (ref 31.5–36.0)
MONO#: 0.3 10*3/uL (ref 0.1–0.9)
RDW: 13.8 % (ref 11.2–14.5)
WBC: 3.4 10*3/uL — ABNORMAL LOW (ref 3.9–10.3)
lymph#: 0.8 10*3/uL — ABNORMAL LOW (ref 0.9–3.3)

## 2011-12-18 MED ORDER — CIPROFLOXACIN HCL 500 MG PO TABS: 500.0000 mg | ORAL_TABLET | Freq: Two times a day (BID) | ORAL | Status: AC

## 2011-12-18 NOTE — Telephone Encounter (Signed)
gve the pt her oct 2013 appt calendar °

## 2011-12-18 NOTE — Patient Instructions (Addendum)
1. For acid indigestion please call your Gastrointestinal doctor to follow up, in the meantime continue taking the protonix and carafe  2. I will see you back in 4 month in follow up

## 2011-12-18 NOTE — Telephone Encounter (Signed)
Labs reviewed by MD, Rx for  Cipro 500mg  PO BID x 5 days sent to pt's Pharmacy. Called pt and notified.

## 2011-12-18 NOTE — Progress Notes (Signed)
OFFICE PROGRESS NOTE  CC Dr. Beather Arbour Dr. Cyndia Bent Dr. Juanetta Beets, MD [address] [place] [address]  DIAGNOSIS: 66 year old female with locally advanced ER negative PR negative HER-2/neu positive right breast cancer.  PRIOR THERAPY:  #1 patient underwent neoadjuvant chemotherapy initially consisting of TCH.  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  #3 patient then went on to receive radiation therapy to the right breast.  #4 patient is now on maintenance Herceptin every 3 weeks. For one year completed on 09/17/11  CURRENT THERAPY: observation and surveillance  INTERVAL HISTORY: Laurie Allen 66 y.o. female returns for followup visit today.Overall patient is doing well. She has now been without any problems. No fevers or chills, no nausea or vomiting. Still continues to have acid reflex. No shortness of breath or chest pains.Remainder of the 10 point review of systems is negative.  MEDICAL HISTORY: Past Medical History  Diagnosis Date  . Anxiety   . GERD (gastroesophageal reflux disease)   . Hx Breast cancer, IDC, Right, Stage II, Receptor -, Her 2 + 06/05/2010  . Rash 06/04/2011  . Palpitations   . Breast cancer   . GERD (gastroesophageal reflux disease) 12/18/2011    ALLERGIES:  is allergic to compazine.  MEDICATIONS:  Current Outpatient Prescriptions  Medication Sig Dispense Refill  . calcium carbonate (TUMS - DOSED IN MG ELEMENTAL CALCIUM) 500 MG chewable tablet Chew 1 tablet by mouth as needed.      Marland Kitchen LORazepam (ATIVAN) 0.5 MG tablet Take 0.5 mg by mouth 3 (three) times daily as needed.        . metoprolol (TOPROL-XL) 50 MG 24 hr tablet Take 50 mg by mouth daily.        . Multiple Vitamins-Minerals (CERTAVITE SENIOR/ANTIOXIDANT) TABS Take 1 tablet by mouth  daily.      . pantoprazole (PROTONIX) 40 MG tablet Take 80 mg by mouth Daily.      . polyvinyl alcohol (LIQUID TEARS) 1.4 % ophthalmic solution 1 drop as needed.        . promethazine (PHENERGAN) 25 MG suppository Place 25 mg rectally every 6 (six) hours as needed.        . zolpidem (AMBIEN) 5 MG tablet TAKE 1 TABLET BY MOUTH AT BEDTIME  30 tablet  0    SURGICAL HISTORY:  Past Surgical History  Procedure Date  . Foot surgery   . Bladder surgery   . Mastectomy partial / lumpectomy w/ axillary lymphadenectomy 01/06/2011    Right- Dr Jamey Ripa  . Portacath placement 06/17/2010  . Breast surgery   . Port-a-cath removal 10/12/2011    Procedure: REMOVAL PORT-A-CATH;  Surgeon: Currie Paris, MD;  Location: [facility];  Service: General;  Laterality: Right;    REVIEW OF SYSTEMS:  Pertinent items are noted in HPI.   PHYSICAL EXAMINATION: General appearance: alert, cooperative, appears stated age and no distress Head: Normocephalic, without obvious abnormality, atraumatic Neck: no adenopathy, no carotid bruit, no JVD, supple, symmetrical, trachea midline and thyroid not enlarged, symmetric, no tenderness/mass/nodules Lymph nodes: Cervical, supraclavicular, and axillary nodes normal. Resp: clear to auscultation bilaterally and normal percussion bilaterally Back: symmetric, no curvature. ROM normal. No CVA tenderness. Cardio: regular rate and rhythm, S1, S2 normal, no murmur, click, rub or gallop and normal apical impulse GI: soft, non-tender; bowel sounds  normal; no masses,  no organomegaly Extremities: extremities normal, atraumatic, no cyanosis or edema Neurologic: Alert and oriented X 3, normal strength and tone. Normal symmetric reflexes. Normal coordination and gait Bilateral breast examination is performed. Right breast reveals barely visible surgical scar from her recent lumpectomy there are no masses no nipple retraction or discharge. Left breast no masses or nipple  discharge. ECOG PERFORMANCE STATUS: 1 - Symptomatic but completely ambulatory  Blood pressure 129/76, pulse 73, temperature 98.4 F (36.9 C), temperature source Oral, weight 159 lb 5 oz (72.264 kg).  LABORATORY DATA: Lab Results  Component Value Date   WBC 3.4* 12/18/2011   HGB 12.1 12/18/2011   HCT 36.3 12/18/2011   MCV 94.2 12/18/2011   PLT 209 12/18/2011      Chemistry      Component Value Date/Time   NA 132* 09/17/2011 0817   K 4.6 09/17/2011 0817   CL 99 09/17/2011 0817   CO2 22 09/17/2011 0817   BUN 14 09/17/2011 0817   CREATININE 0.92 09/17/2011 0817      Component Value Date/Time   CALCIUM 9.0 09/17/2011 0817   ALKPHOS 137* 06/04/2011 0856   AST 20 06/04/2011 0856   ALT 12 06/04/2011 0856   BILITOT 0.4 06/04/2011 0856       RADIOGRAPHIC STUDIES:  No results found.  ASSESSMENT: 66 year old female with:  1.  stage II ER negative PR negative HER-2/neu positive right breast cancer status post neoadjuvant chemotherapy Consisting of Taxotere carboplatinum and Herceptin which she completed in June 2012. She then underwent right breast lumpectomy with axillary lymph node dissection. He was only found to have residual disease. All 18 lymph nodes were negative for metastatic disease. After the lumpectomy and axillary lymph node dissection patient went on to receive radiation therapy with Herceptin every 3 weeks. She completed all of her adjuvant Herceptin therapy On 09/17/2011.Overall she did well.  #2 she has had her Port-A-Cath removed.Patient is now being seen in followup every 4-6 months time.  #3 patient continues to have gastroesophageal reflux disease she is seen by her gastroenterologist.  #4 patient has felt a slight lump in her right breast she is very concerned about this and we will get her set up for a mammogram.  PLAN:  #1 patient will continue to followup with [place] every 4-6 months time.  #2 patient is recommended to continue seeing her primary care physician as well  as her gastroenterologist for her ongoing medical care and especially her GI symptoms.  #3 I will set her up for a mammogram of the right breast which will be a diagnostic mammogram to make sure that she does not have any local evidence of recurrent disease.  All questions were answered. The patient knows to call the clinic with any problems, questions or concerns. We can certainly see the patient much sooner if necessary.  I spent 25 minutes counseling the patient face to face. The total time spent in the appointment was 30 minutes.    Drue Second, MD Medical/Oncology [facility] [phone] (beeper) [phone] (Office)  12/18/2011, 11:16 AM

## 2011-12-19 LAB — COMPREHENSIVE METABOLIC PANEL
ALT: 12 U/L (ref 0–35)
AST: 19 U/L (ref 0–37)
Albumin: 4.3 g/dL (ref 3.5–5.2)
Calcium: 9.9 mg/dL (ref 8.4–10.5)
Chloride: 100 mEq/L (ref 96–112)
Creatinine, Ser: 1.04 mg/dL (ref 0.50–1.10)
Potassium: 4.2 mEq/L (ref 3.5–5.3)
Sodium: 135 mEq/L (ref 135–145)
Total Protein: 6.8 g/dL (ref 6.0–8.3)

## 2011-12-22 ENCOUNTER — Telehealth: Payer: Self-pay | Admitting: *Deleted

## 2011-12-22 NOTE — Telephone Encounter (Signed)
Per MD, notified pt Vitamin D level normal. Pt  Verbalized understanding

## 2011-12-22 NOTE — Telephone Encounter (Signed)
Message copied by Cooper Render on Tue Dec 22, 2011  3:51 PM ------      Message from: Victorino December      Created: Mon Dec 21, 2011 10:50 PM       Call patient: vitamin D level normal

## 2011-12-23 ENCOUNTER — Ambulatory Visit
Admission: RE | Admit: 2011-12-23 | Discharge: 2011-12-23 | Disposition: A | Discharge status: Home / Self Care | Payer: Medicare Other | Source: Ambulatory Visit | Attending: Oncology | Admitting: Oncology

## 2011-12-23 ENCOUNTER — Telehealth: Payer: Self-pay | Admitting: *Deleted

## 2011-12-23 DIAGNOSIS — C50919 Malignant neoplasm of unspecified site of unspecified female breast: Secondary | ICD-10-CM

## 2011-12-23 DIAGNOSIS — N6459 Other signs and symptoms in breast: Secondary | ICD-10-CM | POA: Diagnosis not present

## 2011-12-23 NOTE — Telephone Encounter (Signed)
Message copied by Cooper Render on Wed Dec 23, 2011  1:13 PM ------      Message from: Victorino December      Created: Wed Dec 23, 2011 11:08 AM       Call patient: call pt mammo ok

## 2011-12-23 NOTE — Telephone Encounter (Signed)
Per MD, notified pt Mammo ok. Pt verbalized understanding.

## 2011-12-26 ENCOUNTER — Emergency Department (INDEPENDENT_AMBULATORY_CARE_PROVIDER_SITE_OTHER)
Admission: EM | Admit: 2011-12-26 | Discharge: 2011-12-26 | Disposition: A | Discharge status: Home / Self Care | Payer: Medicare Other | Source: Home / Self Care | Attending: Family Medicine | Admitting: Family Medicine

## 2011-12-26 ENCOUNTER — Encounter (HOSPITAL_COMMUNITY): Payer: Self-pay | Admitting: Emergency Medicine

## 2011-12-26 ENCOUNTER — Other Ambulatory Visit: Payer: Self-pay | Admitting: Hematology & Oncology

## 2011-12-26 DIAGNOSIS — T887XXA Unspecified adverse effect of drug or medicament, initial encounter: Secondary | ICD-10-CM

## 2011-12-26 DIAGNOSIS — K219 Gastro-esophageal reflux disease without esophagitis: Secondary | ICD-10-CM | POA: Diagnosis not present

## 2011-12-26 DIAGNOSIS — T50905A Adverse effect of unspecified drugs, medicaments and biological substances, initial encounter: Secondary | ICD-10-CM

## 2011-12-26 LAB — POCT URINALYSIS DIP (DEVICE)
Glucose, UA: NEGATIVE mg/dL
Hgb urine dipstick: NEGATIVE
Specific Gravity, Urine: <=1.005 (ref 1.005–1.030)
Urobilinogen, UA: 0.2 mg/dL (ref 0.0–1.0)

## 2011-12-26 MED ORDER — GI COCKTAIL ~~LOC~~
30.0000 mL | Freq: Once | ORAL | Status: AC
  Administered 2011-12-26: 30 mL via ORAL

## 2011-12-26 MED ORDER — GI COCKTAIL ~~LOC~~
ORAL | Status: AC
  Filled 2011-12-26: qty 30

## 2011-12-26 NOTE — ED Notes (Signed)
Reports left chest tightness, intermittent.  Also complaints of heart palpitations.  These symptoms are not new, but seem more intense since starting antibiotic on Thursday.  Patient not sure what intended for and wants assistance in determining if she has a uti as on-call pysician suggested.

## 2011-12-26 NOTE — Discharge Instructions (Signed)
Stop the ciprofloxin, drink plenty of fluids, discuss with your doctor next week as needed.

## 2011-12-26 NOTE — ED Provider Notes (Signed)
History     CSN: [id number]  Arrival date & time 12/26/11  1424   First MD Initiated Contact with Patient 12/26/11 1444      Chief Complaint  Patient presents with  . Chest Pain    (Consider location/radiation/quality/duration/timing/severity/associated sxs/prior treatment) Patient is a 66 y.o. female presenting with allergic reaction. The history is provided by the patient.  Allergic Reaction The primary symptoms are  nausea, dizziness and palpitations. The primary symptoms do not include rash. Primary symptoms comment: onset after starting cipro for uncertain etiol,  The current episode started 2 days ago. The problem has been gradually worsening. This is a new problem.  Dizziness also occurs with nausea.   The palpitations also occurred with dizziness.  The onset of the reaction was associated with a new medication.    Past Medical History  Diagnosis Date  . Anxiety   . GERD (gastroesophageal reflux disease)   . Hx Breast cancer, IDC, Right, Stage II, Receptor -, Her 2 + 06/05/2010  . Rash 06/04/2011  . Palpitations   . Breast cancer   . GERD (gastroesophageal reflux disease) 12/18/2011    Past Surgical History  Procedure Date  . Foot surgery   . Bladder surgery   . Mastectomy partial / lumpectomy w/ axillary lymphadenectomy 01/06/2011    Right- Dr Jamey Ripa  . Portacath placement 06/17/2010  . Breast surgery   . Port-a-cath removal 10/12/2011    Procedure: REMOVAL PORT-A-CATH;  Surgeon: Currie Paris, MD;  Location: [facility];  Service: General;  Laterality: Right;    Family History  Problem Relation Age of Onset  . Cancer Father 9    ? type    History  Substance Use Topics  . Smoking status: Never Smoker   . Smokeless tobacco: Never Used  . Alcohol Use: No    OB History    Grav Para Term Preterm Abortions TAB SAB Ect Mult Living                  Review of Systems  Constitutional: Negative.   HENT: Negative.   Respiratory:  Negative.   Cardiovascular: Positive for palpitations.  Gastrointestinal: Positive for nausea.  Skin: Negative for rash.  Neurological: Positive for dizziness.    Allergies  Compazine  Home Medications   Current Outpatient Rx  Name Route Sig Dispense Refill  . CALCIUM CARBONATE ANTACID 500 MG PO CHEW Oral Chew 1 tablet by mouth as needed.    Marland Kitchen CIPROFLOXACIN HCL 500 MG PO TABS Oral Take 1 tablet (500 mg total) by mouth 2 (two) times daily. 10 tablet 0  . LORAZEPAM 0.5 MG PO TABS Oral Take 0.5 mg by mouth 3 (three) times daily as needed.      Marland Kitchen METOPROLOL SUCCINATE ER 50 MG PO TB24 Oral Take 50 mg by mouth daily.      Latina Craver SENIOR/ANTIOXIDANT PO TABS Oral Take 1 tablet by mouth daily.    Marland Kitchen PANTOPRAZOLE SODIUM 40 MG PO TBEC Oral Take 80 mg by mouth Daily.    Marland Kitchen POLYVINYL ALCOHOL 1.4 % OP SOLN  1 drop as needed.      Marland Kitchen PROMETHAZINE HCL 25 MG RE SUPP Rectal Place 25 mg rectally every 6 (six) hours as needed.      Marland Kitchen ZOLPIDEM TARTRATE 5 MG PO TABS  TAKE 1 TABLET BY MOUTH AT BEDTIME 30 tablet 0    BP 140/50  Pulse 68  Temp 98.2 F (36.8 C) (Oral)  Resp 19  SpO2 100%  Physical Exam  Nursing note and vitals reviewed. Constitutional: She is oriented to person, place, and time. She appears well-developed and well-nourished.  HENT:  Mouth/Throat: Oropharynx is clear and moist.  Eyes: Conjunctivae are normal. Pupils are equal, round, and reactive to light.  Neck: Normal range of motion. Neck supple.  Cardiovascular: Normal rate, regular rhythm, normal heart sounds and intact distal pulses.   Pulmonary/Chest: Effort normal and breath sounds normal.  Abdominal: Soft. Bowel sounds are normal. There is no tenderness.  Musculoskeletal: She exhibits no edema.  Lymphadenopathy:    She has no cervical adenopathy.  Neurological: She is alert and oriented to person, place, and time.  Skin: Skin is warm and dry.    ED Course  Procedures (including critical care time)  Labs Reviewed    POCT URINALYSIS DIP (DEVICE) - Abnormal; Notable for the following:    Leukocytes, UA TRACE (*)  Biochemical Testing Only. Please order routine urinalysis from main lab if confirmatory testing is needed.   All other components within normal limits   No results found.   1. Medication adverse effect   2. GERD (gastroesophageal reflux disease)       MDM  U/a neg        Linna Hoff, MD 12/26/11 1530

## 2011-12-28 ENCOUNTER — Telehealth: Payer: Self-pay | Admitting: Medical Oncology

## 2011-12-28 ENCOUNTER — Other Ambulatory Visit: Payer: Self-pay | Admitting: Oncology

## 2011-12-28 NOTE — Telephone Encounter (Signed)
Patient LMOVM "I was recently seen in the urgent care because of a medication reaction, Cipro.  I am really unclear as to why I was prescribed this medication and would like to know why I was given this medication."  Will review with MD

## 2011-12-28 NOTE — Telephone Encounter (Signed)
Let patient know that Urine done at her last visit showed a possible UTI and so the medication was prescribed. For her. She should not take this again.

## 2011-12-28 NOTE — Telephone Encounter (Signed)
LMOVM, per MD, patient received Cipro due to urine on previous visit showing a possible UTI and that she should not take this medication again.   Instructed patient to call clinic with any further questions.

## 2012-01-03 ENCOUNTER — Encounter (HOSPITAL_COMMUNITY): Payer: Self-pay | Admitting: Emergency Medicine

## 2012-01-03 ENCOUNTER — Emergency Department (HOSPITAL_COMMUNITY): Payer: Medicare Other

## 2012-01-03 DIAGNOSIS — K219 Gastro-esophageal reflux disease without esophagitis: Secondary | ICD-10-CM | POA: Insufficient documentation

## 2012-01-03 DIAGNOSIS — R002 Palpitations: Secondary | ICD-10-CM | POA: Insufficient documentation

## 2012-01-03 DIAGNOSIS — Z853 Personal history of malignant neoplasm of breast: Secondary | ICD-10-CM | POA: Insufficient documentation

## 2012-01-03 DIAGNOSIS — Z79899 Other long term (current) drug therapy: Secondary | ICD-10-CM | POA: Insufficient documentation

## 2012-01-03 DIAGNOSIS — R12 Heartburn: Secondary | ICD-10-CM | POA: Diagnosis not present

## 2012-01-03 DIAGNOSIS — F411 Generalized anxiety disorder: Secondary | ICD-10-CM | POA: Diagnosis not present

## 2012-01-03 LAB — COMPREHENSIVE METABOLIC PANEL
BUN: 11 mg/dL (ref 6–23)
Calcium: 10.1 mg/dL (ref 8.4–10.5)
Creatinine, Ser: 0.86 mg/dL (ref 0.50–1.10)
GFR calc Af Amer: 80 mL/min — ABNORMAL LOW (ref >90)
Glucose, Bld: 104 mg/dL — ABNORMAL HIGH (ref 70–99)
Total Protein: 7.7 g/dL (ref 6.0–8.3)

## 2012-01-03 LAB — CBC WITH DIFFERENTIAL/PLATELET
Eosinophils Absolute: 0.1 10*3/uL (ref 0.0–0.7)
Eosinophils Relative: 2 % (ref 0–5)
Hemoglobin: 12.1 g/dL (ref 12.0–15.0)
Lymphs Abs: 1.4 10*3/uL (ref 0.7–4.0)
MCH: 30.9 pg (ref 26.0–34.0)
MCV: 89 fL (ref 78.0–100.0)
Monocytes Relative: 9 % (ref 3–12)
Platelets: 218 10*3/uL (ref 150–400)
RBC: 3.92 MIL/uL (ref 3.87–5.11)

## 2012-01-03 LAB — POCT I-STAT TROPONIN I: Troponin i, poc: 0.01 ng/mL (ref 0.00–0.08)

## 2012-01-03 NOTE — ED Notes (Signed)
Patient complaining of acid reflux for the past several days, with symptoms worsening today.  Patient states that her GERD is causing heart palpitations, headache, nausea, and hand tingling.  Patient states that these symptoms come and go with her GERD symptoms.  Patient reports chest discomfort under left breast; denies shortness of breath.

## 2012-01-04 ENCOUNTER — Emergency Department (HOSPITAL_COMMUNITY)
Admission: EM | Admit: 2012-01-04 | Discharge: 2012-01-04 | Disposition: A | Discharge status: Home / Self Care | Payer: Medicare Other | Attending: Emergency Medicine | Admitting: Emergency Medicine

## 2012-01-04 ENCOUNTER — Encounter (HOSPITAL_COMMUNITY): Payer: Self-pay | Admitting: Emergency Medicine

## 2012-01-04 DIAGNOSIS — K219 Gastro-esophageal reflux disease without esophagitis: Secondary | ICD-10-CM

## 2012-01-04 LAB — D-DIMER, QUANTITATIVE: D-Dimer, Quant: 0.35 ug/mL-FEU (ref 0.00–0.48)

## 2012-01-04 LAB — POCT I-STAT TROPONIN I: Troponin i, poc: 0.01 ng/mL (ref 0.00–0.08)

## 2012-01-04 MED ORDER — FAMOTIDINE 20 MG PO TABS: 20.0000 mg | ORAL_TABLET | Freq: Two times a day (BID) | ORAL | Status: DC

## 2012-01-04 MED ORDER — ASPIRIN 81 MG PO CHEW
324.0000 mg | CHEWABLE_TABLET | Freq: Once | ORAL | Status: AC
  Administered 2012-01-04: 324 mg via ORAL
  Filled 2012-01-04: qty 4

## 2012-01-04 MED ORDER — GI COCKTAIL ~~LOC~~
30.0000 mL | Freq: Once | ORAL | Status: AC
  Administered 2012-01-04: 30 mL via ORAL
  Filled 2012-01-04: qty 30

## 2012-01-04 MED ORDER — FAMOTIDINE 20 MG PO TABS
20.0000 mg | ORAL_TABLET | Freq: Once | ORAL | Status: AC
  Administered 2012-01-04: 20 mg via ORAL
  Filled 2012-01-04: qty 1

## 2012-01-04 MED ORDER — PROMETHAZINE HCL 25 MG/ML IJ SOLN
12.5000 mg | Freq: Once | INTRAMUSCULAR | Status: AC
  Administered 2012-01-04: 12.5 mg via INTRAVENOUS
  Filled 2012-01-04: qty 1

## 2012-01-04 MED ORDER — HYDROMORPHONE HCL PF 1 MG/ML IJ SOLN
1.0000 mg | Freq: Once | INTRAMUSCULAR | Status: AC
  Administered 2012-01-04: 1 mg via INTRAVENOUS
  Filled 2012-01-04: qty 1

## 2012-01-04 MED ORDER — SODIUM CHLORIDE 0.9 % IV BOLUS (SEPSIS)
1000.0000 mL | Freq: Once | INTRAVENOUS | Status: AC
  Administered 2012-01-04: 1000 mL via INTRAVENOUS

## 2012-01-04 MED ORDER — PANTOPRAZOLE SODIUM 40 MG PO TBEC: 40.0000 mg | DELAYED_RELEASE_TABLET | Freq: Once | ORAL | Status: DC

## 2012-01-04 MED ORDER — PANTOPRAZOLE SODIUM 40 MG IV SOLR: 40.0000 mg | Freq: Once | INTRAVENOUS | Status: DC

## 2012-01-04 MED ORDER — PANTOPRAZOLE SODIUM 40 MG IV SOLR
40.0000 mg | Freq: Once | INTRAVENOUS | Status: AC
  Administered 2012-01-04: 40 mg via INTRAVENOUS
  Filled 2012-01-04: qty 40

## 2012-01-04 NOTE — ED Provider Notes (Signed)
History     CSN: [id number]  Arrival date & time 01/03/12  2112   First MD Initiated Contact with Patient 01/04/12 0025      Chief Complaint  Patient presents with  . Palpitations  . Gastrophageal Reflux    (Consider location/radiation/quality/duration/timing/severity/associated sxs/prior treatment) HPI Per PT, has reflux all of the time since chemo in 2011- h/o breast cancer stage II s/p lumpectomy. Last chemo 09/19/11. mammogram "good" 12/20/11.  Usually takes tums, protonix and those are not helping tonight. Has ongoing heartburn for the last 3 days. No leg pain or swelling, no h/o PE. Tonight having anxiety and palpitations, denies ANY new symptoms..moderate in severity. Medications just not helping. Declines an IV, states just wants something stronger. Past Medical History  Diagnosis Date  . Anxiety   . GERD (gastroesophageal reflux disease)   . Hx Breast cancer, IDC, Right, Stage II, Receptor -, Her 2 + 06/05/2010  . Rash 06/04/2011  . Palpitations   . Breast cancer   . GERD (gastroesophageal reflux disease) 12/18/2011    Past Surgical History  Procedure Date  . Foot surgery   . Bladder surgery   . Mastectomy partial / lumpectomy w/ axillary lymphadenectomy 01/06/2011    Right- Dr Jamey Ripa  . Portacath placement 06/17/2010  . Breast surgery   . Port-a-cath removal 10/12/2011    Procedure: REMOVAL PORT-A-CATH;  Surgeon: Currie Paris, MD;  Location: [facility];  Service: General;  Laterality: Right;    Family History  Problem Relation Age of Onset  . Cancer Father 15    ? type    History  Substance Use Topics  . Smoking status: Never Smoker   . Smokeless tobacco: Never Used  . Alcohol Use: No    OB History    Grav Para Term Preterm Abortions TAB SAB Ect Mult Living                  Review of Systems  Constitutional: Negative for fever and chills.  HENT: Negative for neck pain and neck stiffness.   Eyes: Negative for pain.  Respiratory:  Negative for shortness of breath.   Cardiovascular: Positive for palpitations. Negative for chest pain.  Gastrointestinal: Negative for abdominal pain.  Genitourinary: Negative for dysuria.  Musculoskeletal: Negative for back pain.  Skin: Negative for rash.  Neurological: Negative for headaches.  All other systems reviewed and are negative.    Allergies  Compazine  Home Medications   Current Outpatient Rx  Name Route Sig Dispense Refill  . CALCIUM CARBONATE ANTACID 500 MG PO CHEW Oral Chew 1 tablet by mouth as needed.    Marland Kitchen VITAMIN D3 1000 UNITS PO CAPS Oral Take 5 capsules by mouth daily.    Marland Kitchen LORAZEPAM 0.5 MG PO TABS Oral Take 0.5 mg by mouth 3 (three) times daily as needed.      Marland Kitchen METOPROLOL SUCCINATE ER 50 MG PO TB24 Oral Take 50 mg by mouth daily.      Marland Kitchen PANTOPRAZOLE SODIUM 40 MG PO TBEC Oral Take 80 mg by mouth Daily.    Marland Kitchen POLYVINYL ALCOHOL 1.4 % OP SOLN  1 drop as needed.      Marland Kitchen PROMETHAZINE HCL 25 MG RE SUPP Rectal Place 25 mg rectally every 6 (six) hours as needed.      Marland Kitchen ZOLPIDEM TARTRATE 5 MG PO TABS  TAKE 1 TABLET BY MOUTH AT BEDTIME 30 tablet 0    BP 113/69  Pulse 67  Temp 98.4 F (36.9  C) (Oral)  Resp 16  SpO2 100%  Physical Exam  Constitutional: She is oriented to person, place, and time. She appears well-developed and well-nourished.  HENT:  Head: Normocephalic and atraumatic.  Eyes: Conjunctivae and EOM are normal. Pupils are equal, round, and reactive to light.  Neck: Trachea normal. Neck supple. No thyromegaly present.  Cardiovascular: Normal rate, regular rhythm, S1 normal, S2 normal and normal pulses.     No systolic murmur is present   No diastolic murmur is present  Pulses:      Radial pulses are 2+ on the right side, and 2+ on the left side.  Pulmonary/Chest: Effort normal and breath sounds normal. She has no wheezes. She has no rhonchi. She has no rales. She exhibits no tenderness.  Abdominal: Soft. Normal appearance and bowel sounds are normal.  There is no tenderness. There is no CVA tenderness and negative Murphy's sign.  Musculoskeletal:       BLE:s Calves nontender, no cords or erythema, negative Homans sign  Neurological: She is alert and oriented to person, place, and time. She has normal strength. No cranial nerve deficit or sensory deficit. GCS eye subscore is 4. GCS verbal subscore is 5. GCS motor subscore is 6.  Skin: Skin is warm and dry. No rash noted. She is not diaphoretic.  Psychiatric: Her speech is normal.       Cooperative and appropriate    ED Course  Procedures (including critical care time)  Results for orders placed during the hospital encounter of 01/04/12  CBC WITH DIFFERENTIAL      Component Value Range   WBC 3.9 (*) 4.0 - 10.5 K/uL   RBC 3.92  3.87 - 5.11 MIL/uL   Hemoglobin 12.1  12.0 - 15.0 g/dL   HCT 16.1 (*) 09.6 - 04.5 %   MCV 89.0  78.0 - 100.0 fL   MCH 30.9  26.0 - 34.0 pg   MCHC 34.7  30.0 - 36.0 g/dL   RDW 40.9  81.1 - 91.4 %   Platelets 218  150 - 400 K/uL   Neutrophils Relative 54  43 - 77 %   Neutro Abs 2.1  1.7 - 7.7 K/uL   Lymphocytes Relative 35  12 - 46 %   Lymphs Abs 1.4  0.7 - 4.0 K/uL   Monocytes Relative 9  3 - 12 %   Monocytes Absolute 0.4  0.1 - 1.0 K/uL   Eosinophils Relative 2  0 - 5 %   Eosinophils Absolute 0.1  0.0 - 0.7 K/uL   Basophils Relative 0  0 - 1 %   Basophils Absolute 0.0  0.0 - 0.1 K/uL  COMPREHENSIVE METABOLIC PANEL      Component Value Range   Sodium 128 (*) 135 - 145 mEq/L   Potassium 4.5  3.5 - 5.1 mEq/L   Chloride 92 (*) 96 - 112 mEq/L   CO2 26  19 - 32 mEq/L   Glucose, Bld 104 (*) 70 - 99 mg/dL   BUN 11  6 - 23 mg/dL   Creatinine, Ser 7.82  0.50 - 1.10 mg/dL   Calcium 95.6  8.4 - 21.3 mg/dL   Total Protein 7.7  6.0 - 8.3 g/dL   Albumin 4.2  3.5 - 5.2 g/dL   AST 30  0 - 37 U/L   ALT 16  0 - 35 U/L   Alkaline Phosphatase 103  39 - 117 U/L   Total Bilirubin 0.3  0.3 - 1.2 mg/dL  GFR calc non Af Amer 69 (*) >90 mL/min   GFR calc Af Amer 80  (*) >90 mL/min  POCT I-STAT TROPONIN I      Component Value Range   Troponin i, poc 0.01  0.00 - 0.08 ng/mL   Comment 3           POCT I-STAT TROPONIN I      Component Value Range   Troponin i, poc 0.01  0.00 - 0.08 ng/mL   Comment 3            Dg Chest 2 View  01/03/2012  *RADIOLOGY REPORT*  Clinical Data: Gastroesophageal reflux; heart palpitations.  CHEST - 2 VIEW  Comparison: Chest radiograph performed 02/23/2011  Findings: The lungs are well-aerated and clear.  There is no evidence of focal opacification, pleural effusion or pneumothorax.  The heart is normal in size; the mediastinal contour is within normal limits.  No acute osseous abnormalities are seen.  A chronic compression deformity is noted at T10.  Clips are noted at the right axilla.  IMPRESSION:  1.  No acute cardiopulmonary process seen. 2.  Stable appearance to chronic compression deformity at T10.  Original Report Authenticated By: Tonia Ghent, M.D.   Mm Digital Diagnostic Bilat  12/23/2011  *RADIOLOGY REPORT*  Clinical Data:  History of malignant lumpectomy of the right breast in 2012.  Annual reevaluation.  DIGITAL DIAGNOSTIC BILATERAL MAMMOGRAM WITH CAD  Comparison:  Prior studies  Findings:  There are post lumpectomy changes seen posteriorly and slightly laterally within the right breast.  There is no specific evidence for recurrent tumor or developing malignancy within either breast.  The left breast is unchanged. Mammographic images were processed with CAD.  IMPRESSION: No findings worrisome for recurrent tumor or developing malignancy. Recommend annual diagnostic mammography.  RECOMMENDATION: Recommend annual diagnostic mammography.  BI-RADS CATEGORY 1:  Negative.  Original Report Authenticated By: Rolla Plate, M.D.     Date: 01/04/2012  Rate: 72  Rhythm: normal sinus rhythm  QRS Axis: normal  Intervals: normal  ST/T Wave abnormalities: nonspecific ST changes  Conduction Disutrbances:none  Narrative  Interpretation:   Old EKG Reviewed: none available  GI cocktail. Protonix. Pepcid.   Screening ECG reviewed WNL. Labs reviewed as above. Discussed low sodium with PT - she agrees to IV.   2:31 AM recheck still having heartburn, serial troponin checked as above. IV pain meds provided.    3:27 AM doing much better and requesting to be discharged home. She has scheduled GI followup the 22nd of this month and agrees to clear liquid diet for 24 hours and also to add Pepcid to her GI regimen.  With discussion she is already on GERD diet and agrees to continue this.   MDM  Nursing notes reviewed.. records reviewed. EKG, chest x-ray and imaging obtained and reviewed as above. for mild hyponatremia, IV fluids provided.  Heartburn and reflux symptoms with long-standing history of same, has not seen a gastroenterologist but is scheduled to see one soon.  She is already on twice daily PPI. Plan add Pepcid with prescription provided. She'll continue tums and gerd diet. Stable for discharge home       Sunnie Nielsen, MD 01/04/12 [phone]

## 2012-01-05 ENCOUNTER — Encounter (HOSPITAL_COMMUNITY): Payer: Self-pay | Admitting: *Deleted

## 2012-01-05 ENCOUNTER — Emergency Department (INDEPENDENT_AMBULATORY_CARE_PROVIDER_SITE_OTHER)
Admission: EM | Admit: 2012-01-05 | Discharge: 2012-01-05 | Disposition: A | Discharge status: Home / Self Care | Payer: Medicare Other | Source: Home / Self Care | Attending: Emergency Medicine | Admitting: Emergency Medicine

## 2012-01-05 DIAGNOSIS — K219 Gastro-esophageal reflux disease without esophagitis: Secondary | ICD-10-CM

## 2012-01-05 DIAGNOSIS — IMO0001 Reserved for inherently not codable concepts without codable children: Secondary | ICD-10-CM

## 2012-01-05 MED ORDER — HYDROMORPHONE HCL PF 1 MG/ML IJ SOLN
1.0000 mg | Freq: Once | INTRAMUSCULAR | Status: AC
  Administered 2012-01-05: 1 mg via INTRAMUSCULAR

## 2012-01-05 MED ORDER — GI COCKTAIL ~~LOC~~
30.0000 mL | Freq: Once | ORAL | Status: AC
  Administered 2012-01-05: 30 mL via ORAL

## 2012-01-05 MED ORDER — ONDANSETRON HCL 4 MG/2ML IJ SOLN
4.0000 mg | Freq: Once | INTRAMUSCULAR | Status: AC
  Administered 2012-01-05: 4 mg via INTRAMUSCULAR

## 2012-01-05 MED ORDER — HYDROMORPHONE HCL PF 1 MG/ML IJ SOLN
INTRAMUSCULAR | Status: AC
  Filled 2012-01-05: qty 1

## 2012-01-05 MED ORDER — ONDANSETRON 4 MG PO TBDP: 4.0000 mg | ORAL_TABLET | Freq: Three times a day (TID) | ORAL | Status: AC | PRN

## 2012-01-05 MED ORDER — GI COCKTAIL ~~LOC~~
ORAL | Status: AC
  Filled 2012-01-05: qty 30

## 2012-01-05 MED ORDER — ONDANSETRON HCL 4 MG/2ML IJ SOLN
INTRAMUSCULAR | Status: AC
  Filled 2012-01-05: qty 2

## 2012-01-05 NOTE — ED Provider Notes (Signed)
History     CSN: [id number]  Arrival date & time 01/05/12  1553   First MD Initiated Contact with Patient 01/05/12 1906      Chief Complaint  Patient presents with  . Abdominal Pain    (Consider location/radiation/quality/duration/timing/severity/associated sxs/prior treatment) Patient is a 66 y.o. female presenting with abdominal pain. The history is provided by the patient. No language interpreter was used.  Abdominal Pain The primary symptoms of the illness include abdominal pain. The problem has not changed since onset. Additional symptoms associated with the illness include heartburn.   patient complains of reflux and epigastric pain patient was seen yesterday in the emergency department and had GI and cardiac evaluation patient is scheduled to see her GI Dr. July 22 patient is scheduled to see Dr. polite tomorrow patient reports her symptoms were resolved by medications given in the emergency department however they returned today after she took a vitamin and Pepcid. Patient presents to urgent care requesting medication for relief of symptoms. She reports reflux and epigastric pain began after radiation and chemotherapy. Patient is currently on proton ex she was started on Pepcid last night she is also on TUMS.  Past Medical History  Diagnosis Date  . Anxiety   . GERD (gastroesophageal reflux disease)   . Hx Breast cancer, IDC, Right, Stage II, Receptor -, Her 2 + 06/05/2010  . Rash 06/04/2011  . Palpitations   . Breast cancer   . GERD (gastroesophageal reflux disease) 12/18/2011    Past Surgical History  Procedure Date  . Foot surgery   . Bladder surgery   . Mastectomy partial / lumpectomy w/ axillary lymphadenectomy 01/06/2011    Right- Dr Jamey Ripa  . Portacath placement 06/17/2010  . Breast surgery   . Port-a-cath removal 10/12/2011    Procedure: REMOVAL PORT-A-CATH;  Surgeon: Currie Paris, MD;  Location: [facility];  Service: General;  Laterality:  Right;    Family History  Problem Relation Age of Onset  . Cancer Father 20    ? type    History  Substance Use Topics  . Smoking status: Never Smoker   . Smokeless tobacco: Never Used  . Alcohol Use: No    OB History    Grav Para Term Preterm Abortions TAB SAB Ect Mult Living                  Review of Systems  Gastrointestinal: Positive for heartburn and abdominal pain.  All other systems reviewed and are negative.    Allergies  Compazine  Home Medications   Current Outpatient Rx  Name Route Sig Dispense Refill  . CALCIUM CARBONATE ANTACID 500 MG PO CHEW Oral Chew 1 tablet by mouth as needed.    Marland Kitchen VITAMIN D3 1000 UNITS PO CAPS Oral Take 5 capsules by mouth daily.    Marland Kitchen FAMOTIDINE 20 MG PO TABS Oral Take 1 tablet (20 mg total) by mouth 2 (two) times daily. 30 tablet 0  . LORAZEPAM 0.5 MG PO TABS Oral Take 0.5 mg by mouth 3 (three) times daily as needed.      Marland Kitchen METOPROLOL SUCCINATE ER 50 MG PO TB24 Oral Take 50 mg by mouth daily.      Marland Kitchen PANTOPRAZOLE SODIUM 40 MG PO TBEC Oral Take 80 mg by mouth Daily.    Marland Kitchen POLYVINYL ALCOHOL 1.4 % OP SOLN  1 drop as needed.      Marland Kitchen PROMETHAZINE HCL 25 MG RE SUPP Rectal Place 25 mg rectally every  6 (six) hours as needed.      Marland Kitchen ZOLPIDEM TARTRATE 5 MG PO TABS  TAKE 1 TABLET BY MOUTH AT BEDTIME 30 tablet 0    BP 101/75  Pulse 70  Temp 98.5 F (36.9 C) (Oral)  Resp 20  SpO2 100%  Physical Exam  Nursing note and vitals reviewed. Constitutional: She appears well-developed and well-nourished.  HENT:  Head: Normocephalic and atraumatic.  Right Ear: External ear normal.  Left Ear: External ear normal.  Nose: Nose normal.  Mouth/Throat: Oropharynx is clear and moist.  Eyes: Pupils are equal, round, and reactive to light.  Neck: Normal range of motion.  Cardiovascular: Normal rate and normal heart sounds.   Pulmonary/Chest: Effort normal and breath sounds normal.  Abdominal: Soft.  Neurological: She is alert.    ED Course    Procedures (including critical care time)  Labs Reviewed - No data to display Dg Chest 2 View  01/03/2012  *RADIOLOGY REPORT*  Clinical Data: Gastroesophageal reflux; heart palpitations.  CHEST - 2 VIEW  Comparison: Chest radiograph performed 02/23/2011  Findings: The lungs are well-aerated and clear.  There is no evidence of focal opacification, pleural effusion or pneumothorax.  The heart is normal in size; the mediastinal contour is within normal limits.  No acute osseous abnormalities are seen.  A chronic compression deformity is noted at T10.  Clips are noted at the right axilla.  IMPRESSION:  1.  No acute cardiopulmonary process seen. 2.  Stable appearance to chronic compression deformity at T10.  Original Report Authenticated By: Tonia Ghent, M.D.     1. Reflux       MDM   Patient given Dilaudi.d Zof.ran and a GI cocktail last night in the emergency department I will treat with the same here.        Lonia Skinner [place], [place] 01/05/12 1927

## 2012-01-05 NOTE — ED Notes (Signed)
Pt  Reports  Epigastric  Pain  And  Palpations       -  Seen  yest in  [place]  Had   Work  Up  She  Was  Diagnosed   With  Acid  Reflux  Disease    She    Was   refreed  -  She  Called  pcp was  Told  To  Follow  Up  With a  Gastroenterologist       Has  appt  tommorow  With  Her pcp     She  Now  Has  heartburn

## 2012-01-05 NOTE — ED Provider Notes (Signed)
Medical screening examination/treatment/procedure(s) were performed by non-physician practitioner and as supervising physician I was immediately available for consultation/collaboration.  Raynald Blend, MD 01/05/12 2102

## 2012-01-06 DIAGNOSIS — K219 Gastro-esophageal reflux disease without esophagitis: Secondary | ICD-10-CM | POA: Diagnosis not present

## 2012-01-06 DIAGNOSIS — F341 Dysthymic disorder: Secondary | ICD-10-CM | POA: Diagnosis not present

## 2012-01-07 ENCOUNTER — Other Ambulatory Visit: Payer: Self-pay | Admitting: Internal Medicine

## 2012-01-07 DIAGNOSIS — S060X9A Concussion with loss of consciousness of unspecified duration, initial encounter: Secondary | ICD-10-CM

## 2012-01-07 DIAGNOSIS — S060XAA Concussion with loss of consciousness status unknown, initial encounter: Secondary | ICD-10-CM | POA: Diagnosis not present

## 2012-01-07 DIAGNOSIS — R55 Syncope and collapse: Secondary | ICD-10-CM | POA: Diagnosis not present

## 2012-01-11 DIAGNOSIS — R55 Syncope and collapse: Secondary | ICD-10-CM | POA: Diagnosis not present

## 2012-01-12 ENCOUNTER — Ambulatory Visit
Admission: RE | Admit: 2012-01-12 | Discharge: 2012-01-12 | Disposition: A | Discharge status: Home / Self Care | Payer: Medicare Other | Source: Ambulatory Visit | Attending: Internal Medicine | Admitting: Internal Medicine

## 2012-01-12 DIAGNOSIS — Z853 Personal history of malignant neoplasm of breast: Secondary | ICD-10-CM | POA: Diagnosis not present

## 2012-01-12 DIAGNOSIS — S060XAA Concussion with loss of consciousness status unknown, initial encounter: Secondary | ICD-10-CM | POA: Diagnosis not present

## 2012-01-12 DIAGNOSIS — S060X9A Concussion with loss of consciousness of unspecified duration, initial encounter: Secondary | ICD-10-CM

## 2012-01-12 DIAGNOSIS — G319 Degenerative disease of nervous system, unspecified: Secondary | ICD-10-CM | POA: Diagnosis not present

## 2012-01-14 DIAGNOSIS — E871 Hypo-osmolality and hyponatremia: Secondary | ICD-10-CM | POA: Diagnosis not present

## 2012-01-14 DIAGNOSIS — F411 Generalized anxiety disorder: Secondary | ICD-10-CM | POA: Diagnosis not present

## 2012-01-14 DIAGNOSIS — R55 Syncope and collapse: Secondary | ICD-10-CM | POA: Diagnosis not present

## 2012-01-18 ENCOUNTER — Ambulatory Visit (HOSPITAL_COMMUNITY)
Admission: RE | Admit: 2012-01-18 | Discharge: 2012-01-18 | Disposition: A | Discharge status: Home / Self Care | Payer: Medicare Other | Source: Ambulatory Visit | Attending: Gastroenterology | Admitting: Gastroenterology

## 2012-01-18 ENCOUNTER — Encounter (HOSPITAL_COMMUNITY)
Admission: RE | Disposition: A | Discharge status: Home / Self Care | Payer: Self-pay | Source: Ambulatory Visit | Attending: Gastroenterology

## 2012-01-18 DIAGNOSIS — K219 Gastro-esophageal reflux disease without esophagitis: Secondary | ICD-10-CM | POA: Insufficient documentation

## 2012-01-18 HISTORY — PX: ESOPHAGEAL MANOMETRY: SHX5429

## 2012-01-18 HISTORY — PX: 24 HOUR PH STUDY: SHX5419

## 2012-01-18 SURGERY — MANOMETRY, ESOPHAGUS
Anesthesia: Choice

## 2012-01-18 MED ORDER — LIDOCAINE VISCOUS 2 % MT SOLN
OROMUCOSAL | Status: AC
  Filled 2012-01-18: qty 15

## 2012-01-19 ENCOUNTER — Encounter (HOSPITAL_COMMUNITY): Payer: Self-pay | Admitting: Gastroenterology

## 2012-01-31 ENCOUNTER — Emergency Department (INDEPENDENT_AMBULATORY_CARE_PROVIDER_SITE_OTHER)
Admission: EM | Admit: 2012-01-31 | Discharge: 2012-01-31 | Disposition: A | Discharge status: Home / Self Care | Payer: Medicare Other | Source: Home / Self Care | Attending: Emergency Medicine | Admitting: Emergency Medicine

## 2012-01-31 ENCOUNTER — Encounter (HOSPITAL_COMMUNITY): Payer: Self-pay | Admitting: Emergency Medicine

## 2012-01-31 DIAGNOSIS — K2100 Gastro-esophageal reflux disease with esophagitis, without bleeding: Secondary | ICD-10-CM | POA: Diagnosis not present

## 2012-01-31 DIAGNOSIS — T887XXA Unspecified adverse effect of drug or medicament, initial encounter: Secondary | ICD-10-CM | POA: Diagnosis not present

## 2012-01-31 LAB — POCT I-STAT, CHEM 8
BUN: 10 mg/dL (ref 6–23)
Calcium, Ion: 1.26 mmol/L (ref 1.13–1.30)
Chloride: 99 mEq/L (ref 96–112)
Glucose, Bld: 151 mg/dL — ABNORMAL HIGH (ref 70–99)

## 2012-01-31 MED ORDER — CLONAZEPAM 0.5 MG PO TABS: 0.5000 mg | ORAL_TABLET | Freq: Two times a day (BID) | ORAL | Status: DC | PRN

## 2012-01-31 MED ORDER — ALUM & MAG HYDROXIDE-SIMETH 200-200-20 MG/5ML PO SUSP
30.0000 mL | Freq: Once | ORAL | Status: AC
  Administered 2012-01-31: 30 mL via ORAL

## 2012-01-31 MED ORDER — GI COCKTAIL ~~LOC~~
ORAL | Status: AC
  Filled 2012-01-31: qty 30

## 2012-01-31 MED ORDER — ACETAMINOPHEN 325 MG PO TABS
ORAL_TABLET | ORAL | Status: AC
Start: 2012-01-31 — End: 2012-01-31
  Filled 2012-01-31: qty 2

## 2012-01-31 MED ORDER — ACETAMINOPHEN 325 MG PO TABS
650.0000 mg | ORAL_TABLET | Freq: Once | ORAL | Status: AC
  Administered 2012-01-31: 650 mg via ORAL

## 2012-01-31 NOTE — Discharge Instructions (Signed)

## 2012-01-31 NOTE — ED Provider Notes (Signed)
Chief Complaint  Patient presents with  . Allergic Reaction    headache, nausea, chest tightness,dizziness, heart palpatations    History of Present Illness:   Laurie Allen is a 66 year old female who was started on Zoloft 25 mg once every morning this past Friday, 3 days ago. This was done by Dr. Renford Dills because of anxiety. She denies any depression. Ever since then, she has not felt quite right. She describes headaches, chills, nausea, tremulousness, fatigue, palpitations, chest tightness, and dizziness. She's had more reflux than usual. She denies fever, change in her vision, shortness of breath, syncope, abdominal pain, vomiting, diarrhea, or urinary symptoms. She has a history of reflux esophagitis and anxiety. She was seen here in June because of some chest discomfort and an EKG at that time was normal.  Review of Systems:  Other than noted above, the patient denies any of the following symptoms. Systemic:  No fever, chills, sweats, fatigue, myalgias, headache, or anorexia. Eye:  No redness, pain or drainage. ENT:  No earache, nasal congestion, rhinorrhea, sinus pressure, or sore throat. Lungs:  No cough, sputum production, wheezing, shortness of breath.  Cardiovascular:  No chest pain, palpitations, or syncope. GI:  No nausea, vomiting, abdominal pain or diarrhea. GU:  No dysuria, frequency, or hematuria. Skin:  No rash or pruritis.  PMFSH:  Past medical history, family history, social history, meds, and allergies were reviewed.   Physical Exam:   Vital signs:  BP 146/83  Pulse 73  Temp 98.6 F (37 C) (Oral)  Resp 17  SpO2 98% General:  Alert, in no distress. Eye:  PERRL, full EOMs.  Lids and conjunctivas were normal. ENT:  TMs and canals were normal, without erythema or inflammation.  Nasal mucosa was clear and uncongested, without drainage.  Mucous membranes were moist.  Pharynx was clear, without exudate or drainage.  There were no oral ulcerations or lesions. Neck:   Supple, no adenopathy, tenderness or mass. Thyroid was normal. Lungs:  No respiratory distress.  Lungs were clear to auscultation, without wheezes, rales or rhonchi.  Breath sounds were clear and equal bilaterally. Heart:  Regular rhythm, without gallops, murmers or rubs. Abdomen:  Soft, flat, and non-tender to palpation.  No hepatosplenomagaly or mass. Skin:  Clear, warm, and dry, without rash or lesions.  Labs:   Results for orders placed during the hospital encounter of 01/31/12  POCT I-STAT, CHEM 8      Component Value Range   Sodium 135  135 - 145 mEq/L   Potassium 4.4  3.5 - 5.1 mEq/L   Chloride 99  96 - 112 mEq/L   BUN 10  6 - 23 mg/dL   Creatinine, Ser 4.09  0.50 - 1.10 mg/dL   Glucose, Bld 811 (*) 70 - 99 mg/dL   Calcium, Ion 9.14  7.82 - 1.30 mmol/L   TCO2 24  0 - 100 mmol/L   Hemoglobin 12.6  12.0 - 15.0 g/dL   HCT 95.6  21.3 - 08.6 %     Date: 01/31/2012  Rate: 66  Rhythm: normal sinus rhythm with occasional PACs  QRS Axis: normal  Intervals: normal  ST/T Wave abnormalities: normal  Conduction Disutrbances:none  Narrative Interpretation:  sinus rhythm with occasional PACs, otherwise normal EKG   Old EKG Reviewed: unchanged  Course in Urgent Care Center:   She was given GI cocktail 30 mL by mouth and Tylenol 650 mg by mouth and tolerated these well without any immediate side effects.  Assessment:  The primary  encounter diagnosis was Non-dose-related adverse reaction to medication. A diagnosis of Reflux esophagitis was also pertinent to this visit.  Plan:   1.  The following meds were prescribed:   New Prescriptions   CLONAZEPAM (KLONOPIN) 0.5 MG TABLET    Take 1 tablet (0.5 mg total) by mouth 2 (two) times daily as needed for anxiety.   2.  The patient was instructed in symptomatic care and handouts were given. 3.  The patient was told to return if becoming worse in any way, if no better in 3 or 4 days, and given some red flag symptoms that would indicate earlier  return.  Follow up:  The patient was told to follow up with Dr. Nehemiah Settle in 10 days. I suggested in the meantime that she stop the Zoloft and try the clonazepam instead.     Reuben Likes, MD 01/31/12 2029

## 2012-01-31 NOTE — ED Notes (Signed)
Pt states her doctor started new medication, (zoloft) for anxiety . Started having headache, chills, nausea/chest tightness, heart palpations and dizziness on Friday.

## 2012-02-10 DIAGNOSIS — F341 Dysthymic disorder: Secondary | ICD-10-CM | POA: Diagnosis not present

## 2012-02-10 DIAGNOSIS — D126 Benign neoplasm of colon, unspecified: Secondary | ICD-10-CM | POA: Diagnosis not present

## 2012-02-10 DIAGNOSIS — E871 Hypo-osmolality and hyponatremia: Secondary | ICD-10-CM | POA: Diagnosis not present

## 2012-02-15 ENCOUNTER — Ambulatory Visit (INDEPENDENT_AMBULATORY_CARE_PROVIDER_SITE_OTHER): Payer: Medicare Other | Admitting: Surgery

## 2012-03-01 DIAGNOSIS — N8189 Other female genital prolapse: Secondary | ICD-10-CM | POA: Diagnosis not present

## 2012-03-02 ENCOUNTER — Ambulatory Visit (INDEPENDENT_AMBULATORY_CARE_PROVIDER_SITE_OTHER): Payer: Medicare Other | Admitting: Surgery

## 2012-03-02 ENCOUNTER — Encounter (INDEPENDENT_AMBULATORY_CARE_PROVIDER_SITE_OTHER): Payer: Self-pay | Admitting: Surgery

## 2012-03-02 VITALS — BP 118/64 | HR 72 | Temp 97.3°F | Resp 16 | Ht 65.0 in | Wt 152.2 lb

## 2012-03-02 DIAGNOSIS — Z853 Personal history of malignant neoplasm of breast: Secondary | ICD-10-CM

## 2012-03-02 NOTE — Progress Notes (Signed)
NAME: Laurie Allen       DOB: [birth year]           DATE: 03/02/2012       MRN: [id number]   Laurie Allen is a 66 y.o.Marland Kitchenfemale who presents for routine followup of her Right breast cancer diagnosed in 2011 and treated with neoadjuvant chemo, lumpectomy, radiation. She has no problems or concerns on either side except some chronic right axillary discomfort  PFSH: She has had no significant changes since the last visit here.  ROS: There have been no significant changes since the last visit here  EXAM: General: The patient is alert, oriented, generally healty appearing, NAD. Mood and affect are normal.  Breasts:  Right side shows radiation changes but no evidence of recurrence. The is slight dimpling at the lumpectomy site c/w loss of tissue Lymphatics: She has no axillary or supraclavicular adenopathy on either side.  Extremities: Full ROM of the surgical side with no lymphedema noted.  Data Reviewed: Mammogream: IMPRESSION:  No findings worrisome for recurrent tumor or developing malignancy.  Recommend annual diagnostic mammography.  RECOMMENDATION:  Recommend annual diagnostic mammography.  BI-RADS CATEGORY 1: Negative.   Impression: Doing well, with no evidence of recurrent cancer or new cancer  Plan: Will continue to follow up in six months

## 2012-03-02 NOTE — Patient Instructions (Signed)
See [place] again in a year. Call if any problems before then.

## 2012-03-14 ENCOUNTER — Telehealth: Payer: Self-pay | Admitting: *Deleted

## 2012-03-14 NOTE — Telephone Encounter (Signed)
Pt called Laurie Allen states " is it okay to take Estrin vagina cream that my GYN Dr. wants me to use" Pt concerns forwarded to MD for review

## 2012-03-15 ENCOUNTER — Telehealth: Payer: Self-pay | Admitting: *Deleted

## 2012-03-15 NOTE — Telephone Encounter (Signed)
No

## 2012-03-15 NOTE — Telephone Encounter (Signed)
Per MD notified pt is is not safe to use Estrin Vagina cream. Pt verbalized understanding. No further questions

## 2012-03-23 DIAGNOSIS — N8189 Other female genital prolapse: Secondary | ICD-10-CM | POA: Diagnosis not present

## 2012-04-18 ENCOUNTER — Ambulatory Visit (HOSPITAL_BASED_OUTPATIENT_CLINIC_OR_DEPARTMENT_OTHER): Payer: Medicare Other | Admitting: Oncology

## 2012-04-18 ENCOUNTER — Other Ambulatory Visit (HOSPITAL_BASED_OUTPATIENT_CLINIC_OR_DEPARTMENT_OTHER): Payer: Medicare Other | Admitting: Lab

## 2012-04-18 ENCOUNTER — Telehealth: Payer: Self-pay | Admitting: *Deleted

## 2012-04-18 VITALS — BP 122/71 | HR 78 | Temp 97.6°F | Resp 20 | Ht 65.0 in | Wt 146.7 lb

## 2012-04-18 DIAGNOSIS — C50919 Malignant neoplasm of unspecified site of unspecified female breast: Secondary | ICD-10-CM

## 2012-04-18 DIAGNOSIS — C50519 Malignant neoplasm of lower-outer quadrant of unspecified female breast: Secondary | ICD-10-CM | POA: Diagnosis not present

## 2012-04-18 DIAGNOSIS — N814 Uterovaginal prolapse, unspecified: Secondary | ICD-10-CM | POA: Diagnosis not present

## 2012-04-18 DIAGNOSIS — E559 Vitamin D deficiency, unspecified: Secondary | ICD-10-CM

## 2012-04-18 DIAGNOSIS — Z171 Estrogen receptor negative status [ER-]: Secondary | ICD-10-CM

## 2012-04-18 DIAGNOSIS — K219 Gastro-esophageal reflux disease without esophagitis: Secondary | ICD-10-CM

## 2012-04-18 DIAGNOSIS — K2100 Gastro-esophageal reflux disease with esophagitis, without bleeding: Secondary | ICD-10-CM | POA: Diagnosis not present

## 2012-04-18 DIAGNOSIS — T887XXA Unspecified adverse effect of drug or medicament, initial encounter: Secondary | ICD-10-CM | POA: Diagnosis not present

## 2012-04-18 LAB — CBC WITH DIFFERENTIAL/PLATELET
BASO%: 0.7 % (ref 0.0–2.0)
Basophils Absolute: 0 10*3/uL (ref 0.0–0.1)
HCT: 37.3 % (ref 34.8–46.6)
LYMPH%: 29.1 % (ref 14.0–49.7)
MCHC: 33.9 g/dL (ref 31.5–36.0)
MONO#: 0.3 10*3/uL (ref 0.1–0.9)
NEUT%: 59.3 % (ref 38.4–76.8)
Platelets: 213 10*3/uL (ref 145–400)
WBC: 3.6 10*3/uL — ABNORMAL LOW (ref 3.9–10.3)

## 2012-04-18 LAB — COMPREHENSIVE METABOLIC PANEL (CC13)
BUN: 12 mg/dL (ref 7.0–26.0)
CO2: 25 mEq/L (ref 22–29)
Creatinine: 0.9 mg/dL (ref 0.6–1.1)
Glucose: 76 mg/dl (ref 70–99)
Total Bilirubin: 0.6 mg/dL (ref 0.20–1.20)

## 2012-04-18 MED ORDER — ZOLPIDEM TARTRATE 5 MG PO TABS: 5.0000 mg | ORAL_TABLET | Freq: Every evening | ORAL | Status: DC | PRN

## 2012-04-18 NOTE — Progress Notes (Signed)
OFFICE PROGRESS NOTE  CC Dr. Beather Arbour Dr. Cyndia Bent Dr. Juanetta Beets, MD [address] [place] [address]  DIAGNOSIS: 66 year old female with locally advanced ER negative PR negative HER-2/neu positive right breast cancer.  PRIOR THERAPY:  #1 patient underwent neoadjuvant chemotherapy initially consisting of TCH.  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  #3 patient then went on to receive radiation therapy to the right breast.  #4 patient is now on maintenance Herceptin every 3 weeks. For one year completed on 09/17/11  CURRENT THERAPY: observation and surveillance  INTERVAL HISTORY: Laurie Allen 66 y.o. female returns for followup visit today.Overall patient is doing well. Patient is continuing to having issues with reflux. She is followed by Dr. Loreta Ave for this. She is also having some gynecologic issues including uterine prolapse. There was conversation between myself and Dr. Nicholas Lose regarding the use of local estrogens to help her through this difficult situation. We discussed this extensively. He has also spoken to the patient regarding this. At this time I do believe that he has given her a prescription for Estring but patient has not picked up the prescription yet from her pharmacy. I have encouraged her to go ahead and get this and to begin this to see if this indeed does help her. Certainly she will need to be monitored very closely. I do think the benefits from Estring will far outweigh the risks and certainly we can be following her closely.She otherwise feels well no nausea or vomiting no peripheral paresthesias no myalgias or arthralgias. She does occasionally become hyponatremic she is followed by Dr. Nehemiah Settle for this.She has not noticed any masses in her breasts.No easy  bruising. Remainder of the 10 point review of systems is unremarkable. MEDICAL HISTORY: Past Medical History  Diagnosis Date  . Anxiety   . GERD (gastroesophageal reflux disease)   . Hx Breast cancer, IDC, Right, Stage II, Receptor -, Her 2 + 06/05/2010  . Rash 06/04/2011  . Palpitations   . Breast cancer   . GERD (gastroesophageal reflux disease) 12/18/2011    ALLERGIES:  is allergic to compazine.  MEDICATIONS:  Current Outpatient Prescriptions  Medication Sig Dispense Refill  . calcium carbonate (TUMS - DOSED IN MG ELEMENTAL CALCIUM) 500 MG chewable tablet Chew 1 tablet by mouth as needed.      . Cholecalciferol (VITAMIN D3) 1000 UNITS CAPS Take 5 capsules by mouth daily.      . famotidine (PEPCID) 20 MG tablet Take 1 tablet (20 mg total) by mouth 2 (two) times daily.  30 tablet  0  . LORazepam (ATIVAN) 0.5 MG tablet       . metoprolol (TOPROL-XL) 50 MG 24 hr tablet Take 50 mg by mouth daily.        . Multiple Vitamins-Minerals (MULTIVITAMIN WITH MINERALS) tablet Take 1 tablet by mouth daily.      . polyvinyl alcohol (LIQUID TEARS) 1.4 % ophthalmic solution 1 drop as needed.        . promethazine (PHENERGAN) 25 MG suppository Place 25 mg rectally every 6 (six) hours as needed.        . zolpidem (AMBIEN) 5 MG tablet TAKE 1 TABLET BY MOUTH AT BEDTIME  30 tablet  0  . DISCONTD: sertraline (ZOLOFT) 25 MG tablet Take 25 mg by mouth daily.  SURGICAL HISTORY:  Past Surgical History  Procedure Date  . Foot surgery   . Bladder surgery   . Mastectomy partial / lumpectomy w/ axillary lymphadenectomy 01/06/2011    Right- Dr Jamey Ripa  . Portacath placement 06/17/2010  . Breast surgery   . Port-a-cath removal 10/12/2011    Procedure: REMOVAL PORT-A-CATH;  Surgeon: Currie Paris, MD;  Location: [facility];  Service: General;  Laterality: Right;  . Esophageal manometry 01/18/2012    Procedure: ESOPHAGEAL MANOMETRY (EM);  Surgeon: Charolett Bumpers, MD;  Location: [facility];  Service: Endoscopy;  Laterality: N/A;  . 24 hour ph study 01/18/2012    Procedure: 24 HOUR PH STUDY;  Surgeon: Charolett Bumpers, MD;  Location: [facility];  Service: Endoscopy;  Laterality: N/A;    REVIEW OF SYSTEMS:  Pertinent items are noted in HPI.   PHYSICAL EXAMINATION:  Well-developed female she looks remarkably well since her last visit with me HEENT exam EOMI PERRLA sclerae anicteric no conjunctival pallor oral mucosa is moist neck is supple lungs are clear cardiovascular regular rate rhythm abdomen is soft nontender nondistended bowel sounds are present at no HSM extremities no edema neuro patient's alert oriented otherwise nonfocal. Bilateral breast examination is performed. Right breast reveals barely visible surgical scar from her lumpectomy there are no masses no nipple retraction or discharge. Left breast no masses or nipple discharge. ECOG PERFORMANCE STATUS: 1 - Symptomatic but completely ambulatory  Blood pressure 122/71, pulse 78, temperature 97.6 F (36.4 C), temperature source Oral, resp. rate 20, height 5\' 5"  (1.651 m), weight 146 lb 11.2 oz (66.543 kg).  LABORATORY DATA: Lab Results  Component Value Date   WBC 3.6* 04/18/2012   HGB 12.6 04/18/2012   HCT 37.3 04/18/2012   MCV 95.2 04/18/2012   PLT 213 04/18/2012      Chemistry      Component Value Date/Time   NA 135* 04/18/2012 1031   NA 135 01/31/2012 2001   K 4.1 04/18/2012 1031   K 4.4 01/31/2012 2001   CL 102 04/18/2012 1031   CL 99 01/31/2012 2001   CO2 25 04/18/2012 1031   CO2 26 01/03/2012 2155   BUN 12.0 04/18/2012 1031   BUN 10 01/31/2012 2001   CREATININE 0.9 04/18/2012 1031   CREATININE 0.90 01/31/2012 2001      Component Value Date/Time   CALCIUM 9.9 04/18/2012 1031   CALCIUM 10.1 01/03/2012 2155   ALKPHOS 96 04/18/2012 1031   ALKPHOS 103 01/03/2012 2155   AST 18 04/18/2012 1031   AST 30 01/03/2012 2155   ALT 13 04/18/2012 1031   ALT 16 01/03/2012 2155   BILITOT 0.60 04/18/2012 1031    BILITOT 0.3 01/03/2012 2155       RADIOGRAPHIC STUDIES:  No results found.  ASSESSMENT: 66 year old female with:  1.  stage II ER negative PR negative HER-2/neu positive right breast cancer status post neoadjuvant chemotherapy Consisting of Taxotere carboplatinum and Herceptin which she completed in June 2012. She then underwent right breast lumpectomy with axillary lymph node dissection. He was only found to have residual disease. All 18 lymph nodes were negative for metastatic disease. After the lumpectomy and axillary lymph node dissection patient went on to receive radiation therapy with Herceptin every 3 weeks. She completed all of her adjuvant Herceptin therapy On 09/17/2011.Overall she did well.Patient has no evidence of recurrent disease clinically.  #2 patient continues to have gastroesophageal reflux disease she is seen by her gastroenterologist.  PLAN:  #1  patient will continue to followup with [place] every 4 months time.  #2 patient is recommended to continue seeing her primary care physician as well as her gastroenterologist for her ongoing medical care and especially her GI symptoms.  #3 patient will be seen by her gynecologist Dr. Nicholas Lose regarding her ongoing issues with uterine prolapse. I do think it is reasonable given the circumstances that patient began local estrogen since her tumor was ER negative. I have discussed this with the patient myself she is concerned that she is certainly willing to proceed with this. She does understand the risks and benefits and the side effects of this approach certainly in her circumstance the benefits far outweigh the potential for risks. She understands that we will need to monitor her closely she should be doing self breast examinations and we will continue to get her annual diagnostic mammograms. She also knows to call me with any problems questions or concerns.  All questions were answered. The patient knows to call the clinic with any  problems, questions or concerns. We can certainly see the patient much sooner if necessary.  I spent 25 minutes counseling the patient face to face. The total time spent in the appointment was 30 minutes.    Drue Second, MD Medical/Oncology [facility] [phone] (beeper) [phone] (Office)  04/18/2012, 11:46 AM

## 2012-04-18 NOTE — Patient Instructions (Addendum)
Doing well.  I will continue to see you once every 4 months.  Take B complex vitamins daily

## 2012-04-18 NOTE — Telephone Encounter (Signed)
Gave patient appointment for 08-19-2012 starting at 9:30am

## 2012-04-21 DIAGNOSIS — Z09 Encounter for follow-up examination after completed treatment for conditions other than malignant neoplasm: Secondary | ICD-10-CM | POA: Diagnosis not present

## 2012-04-21 DIAGNOSIS — Z86010 Personal history of colonic polyps: Secondary | ICD-10-CM | POA: Diagnosis not present

## 2012-06-06 DIAGNOSIS — Z23 Encounter for immunization: Secondary | ICD-10-CM | POA: Diagnosis not present

## 2012-06-06 DIAGNOSIS — F411 Generalized anxiety disorder: Secondary | ICD-10-CM | POA: Diagnosis not present

## 2012-06-06 DIAGNOSIS — E871 Hypo-osmolality and hyponatremia: Secondary | ICD-10-CM | POA: Diagnosis not present

## 2012-07-19 ENCOUNTER — Other Ambulatory Visit: Payer: Self-pay | Admitting: Gynecology

## 2012-08-12 ENCOUNTER — Telehealth: Payer: Self-pay | Admitting: Oncology

## 2012-08-12 NOTE — Telephone Encounter (Signed)
appt moved from 2/21 to 2/18 per KK due to CME and then from 2/18 to 2/24 per desk nurse/KK. S/w pt she is aware.

## 2012-08-16 ENCOUNTER — Other Ambulatory Visit: Payer: Self-pay | Admitting: Lab

## 2012-08-16 ENCOUNTER — Ambulatory Visit: Payer: Self-pay | Admitting: Oncology

## 2012-08-19 ENCOUNTER — Ambulatory Visit: Payer: Medicare Other | Admitting: Oncology

## 2012-08-19 ENCOUNTER — Other Ambulatory Visit: Payer: Medicare Other | Admitting: Lab

## 2012-08-22 ENCOUNTER — Ambulatory Visit (HOSPITAL_BASED_OUTPATIENT_CLINIC_OR_DEPARTMENT_OTHER): Payer: BC Managed Care – HMO | Admitting: Oncology

## 2012-08-22 ENCOUNTER — Telehealth: Payer: Self-pay | Admitting: Oncology

## 2012-08-22 ENCOUNTER — Other Ambulatory Visit (HOSPITAL_BASED_OUTPATIENT_CLINIC_OR_DEPARTMENT_OTHER): Payer: BC Managed Care – HMO | Admitting: Lab

## 2012-08-22 ENCOUNTER — Encounter: Payer: Self-pay | Admitting: Oncology

## 2012-08-22 VITALS — BP 137/72 | HR 77 | Temp 97.5°F | Resp 20 | Ht 65.0 in | Wt 145.3 lb

## 2012-08-22 DIAGNOSIS — C773 Secondary and unspecified malignant neoplasm of axilla and upper limb lymph nodes: Secondary | ICD-10-CM

## 2012-08-22 DIAGNOSIS — C50519 Malignant neoplasm of lower-outer quadrant of unspecified female breast: Secondary | ICD-10-CM

## 2012-08-22 DIAGNOSIS — C50911 Malignant neoplasm of unspecified site of right female breast: Secondary | ICD-10-CM

## 2012-08-22 DIAGNOSIS — E559 Vitamin D deficiency, unspecified: Secondary | ICD-10-CM

## 2012-08-22 DIAGNOSIS — K219 Gastro-esophageal reflux disease without esophagitis: Secondary | ICD-10-CM

## 2012-08-22 DIAGNOSIS — N814 Uterovaginal prolapse, unspecified: Secondary | ICD-10-CM

## 2012-08-22 DIAGNOSIS — C50919 Malignant neoplasm of unspecified site of unspecified female breast: Secondary | ICD-10-CM

## 2012-08-22 LAB — COMPREHENSIVE METABOLIC PANEL (CC13)
CO2: 29 mEq/L (ref 22–29)
Calcium: 9.9 mg/dL (ref 8.4–10.4)
Chloride: 104 mEq/L (ref 98–107)
Creatinine: 1 mg/dL (ref 0.6–1.1)
Glucose: 107 mg/dl — ABNORMAL HIGH (ref 70–99)
Total Bilirubin: 0.43 mg/dL (ref 0.20–1.20)
Total Protein: 7.4 g/dL (ref 6.4–8.3)

## 2012-08-22 LAB — CBC WITH DIFFERENTIAL/PLATELET
Eosinophils Absolute: 0.1 10*3/uL (ref 0.0–0.5)
HCT: 36.3 % (ref 34.8–46.6)
HGB: 12.3 g/dL (ref 11.6–15.9)
LYMPH%: 31.1 % (ref 14.0–49.7)
MONO#: 0.3 10*3/uL (ref 0.1–0.9)
NEUT#: 2.4 10*3/uL (ref 1.5–6.5)
NEUT%: 59.2 % (ref 38.4–76.8)
Platelets: 211 10*3/uL (ref 145–400)
WBC: 4 10*3/uL (ref 3.9–10.3)
lymph#: 1.2 10*3/uL (ref 0.9–3.3)

## 2012-08-22 NOTE — Progress Notes (Signed)
OFFICE PROGRESS NOTE  CC Dr. Beather Arbour Dr. Cyndia Bent Dr. Juanetta Beets, MD [address] [place] [address]  DIAGNOSIS: 67 year old female with locally advanced ER negative PR negative HER-2/neu positive right breast cancer.  PRIOR THERAPY:  #1 patient underwent neoadjuvant chemotherapy initially consisting of TCH.  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  #3 patient then went on to receive radiation therapy to the right breast.  #4 patient is now on maintenance Herceptin every 3 weeks. For one year completed on 09/17/11  CURRENT THERAPY: observation and surveillance  INTERVAL HISTORY: Laurie Allen 67 y.o. female returns for followup visit today.She otherwise feels well no nausea or vomiting no peripheral paresthesias no myalgias or arthralgias. She does occasionally become hyponatremic she is followed by Dr. Nehemiah Settle for this.She has not noticed any masses in her breasts.No easy bruising. Remainder of the 10 point review of systems is unremarkable. MEDICAL HISTORY: Past Medical History  Diagnosis Date  . Anxiety   . GERD (gastroesophageal reflux disease)   . Hx Breast cancer, IDC, Right, Stage II, Receptor -, Her 2 + 06/05/2010  . Rash 06/04/2011  . Palpitations   . Breast cancer   . GERD (gastroesophageal reflux disease) 12/18/2011    ALLERGIES:  is allergic to compazine.  MEDICATIONS:  Current Outpatient Prescriptions  Medication Sig Dispense Refill  . Ascorbic Acid (VITAMIN C PO) Take by mouth.      . B Complex Vitamins (VITAMIN B-COMPLEX PO) Take by mouth.      . Cholecalciferol (VITAMIN D3) 1000 UNITS CAPS Take 1 capsule by mouth daily.       Marland Kitchen CINNAMON PO Take by mouth.      Marland Kitchen LORazepam (ATIVAN) 0.5 MG tablet       . polyvinyl alcohol (LIQUID TEARS) 1.4 %  ophthalmic solution 1 drop as needed.        . zolpidem (AMBIEN) 5 MG tablet Take 1 tablet (5 mg total) by mouth at bedtime as needed for sleep.  30 tablet  0  . [DISCONTINUED] sertraline (ZOLOFT) 25 MG tablet Take 25 mg by mouth daily.       No current facility-administered medications for this visit.    SURGICAL HISTORY:  Past Surgical History  Procedure Laterality Date  . Foot surgery    . Bladder surgery    . Mastectomy partial / lumpectomy w/ axillary lymphadenectomy  01/06/2011    Right- Dr Jamey Ripa  . Portacath placement  06/17/2010  . Breast surgery    . Port-a-cath removal  10/12/2011    Procedure: REMOVAL PORT-A-CATH;  Surgeon: Currie Paris, MD;  Location: [facility];  Service: General;  Laterality: Right;  . Esophageal manometry  01/18/2012    Procedure: ESOPHAGEAL MANOMETRY (EM);  Surgeon: Charolett Bumpers, MD;  Location: [facility];  Service: Endoscopy;  Laterality: N/A;  . 24 hour ph study  01/18/2012    Procedure: 24 HOUR PH STUDY;  Surgeon: Charolett Bumpers, MD;  Location: [facility];  Service: Endoscopy;  Laterality: N/A;    REVIEW OF SYSTEMS:  Pertinent items are noted in HPI.   PHYSICAL EXAMINATION:  Well-developed female she looks remarkably well since her last visit with me HEENT exam EOMI PERRLA sclerae anicteric no conjunctival pallor oral mucosa is moist neck is supple lungs are clear cardiovascular regular  rate rhythm abdomen is soft nontender nondistended bowel sounds are present at no HSM extremities no edema neuro patient's alert oriented otherwise nonfocal. Bilateral breast examination is performed. Right breast reveals barely visible surgical scar from her lumpectomy there are no masses no nipple retraction or discharge. Left breast no masses or nipple discharge. ECOG PERFORMANCE STATUS: 1 - Symptomatic but completely ambulatory  Blood pressure 137/72, pulse 77, temperature 97.5 F (36.4 C), temperature source Oral, resp. rate 20,  height 5\' 5"  (1.651 m), weight 145 lb 4.8 oz (65.908 kg).  LABORATORY DATA: Lab Results  Component Value Date   WBC 4.0 08/22/2012   HGB 12.3 08/22/2012   HCT 36.3 08/22/2012   MCV 93.0 08/22/2012   PLT 211 08/22/2012      Chemistry      Component Value Date/Time   NA 135* 04/18/2012 1031   NA 135 01/31/2012 2001   K 4.1 04/18/2012 1031   K 4.4 01/31/2012 2001   CL 102 04/18/2012 1031   CL 99 01/31/2012 2001   CO2 25 04/18/2012 1031   CO2 26 01/03/2012 2155   BUN 12.0 04/18/2012 1031   BUN 10 01/31/2012 2001   CREATININE 0.9 04/18/2012 1031   CREATININE 0.90 01/31/2012 2001      Component Value Date/Time   CALCIUM 9.9 04/18/2012 1031   CALCIUM 10.1 01/03/2012 2155   ALKPHOS 96 04/18/2012 1031   ALKPHOS 103 01/03/2012 2155   AST 18 04/18/2012 1031   AST 30 01/03/2012 2155   ALT 13 04/18/2012 1031   ALT 16 01/03/2012 2155   BILITOT 0.60 04/18/2012 1031   BILITOT 0.3 01/03/2012 2155       RADIOGRAPHIC STUDIES:  No results found.  ASSESSMENT: 67 year old female with:  #1.  stage II ER negative PR negative HER-2/neu positive right breast cancer status post neoadjuvant chemotherapy Consisting of Taxotere carboplatinum and Herceptin which she completed in June 2012. She then underwent right breast lumpectomy with axillary lymph node dissection. He was only found to have residual disease. All 18 lymph nodes were negative for metastatic disease. After the lumpectomy and axillary lymph node dissection patient went on to receive radiation therapy with Herceptin every 3 weeks. She completed all of her adjuvant Herceptin therapy On 09/17/2011.Overall she did well.Patient has no evidence of recurrent disease clinically.  #2 patient continues to have gastroesophageal reflux disease she is seen by her gastroenterologist.  #3 vaginal atrophy being seen by Dr. Nicholas Lose  PLAN:  #1Doing well no evidence of recurrent disease  #2 I will see her back in 6 months. Next Mammogram due in June 2014  All questions  were answered. The patient knows to call the clinic with any problems, questions or concerns. We can certainly see the patient much sooner if necessary.  I spent 25 minutes counseling the patient face to face. The total time spent in the appointment was 30 minutes.    Drue Second, MD Medical/Oncology [facility] [phone] (beeper) [phone] (Office)  08/22/2012, 1:25 PM

## 2012-08-22 NOTE — Progress Notes (Signed)
OFFICE PROGRESS NOTE  CC Dr. Beather Arbour Dr. Cyndia Bent Dr. Juanetta Beets, MD [address] [place] [address]  DIAGNOSIS: 67 year old female with locally advanced ER negative PR negative HER-2/neu positive right breast cancer.  PRIOR THERAPY:  #1 patient underwent neoadjuvant chemotherapy initially consisting of TCH.  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  #3 patient then went on to receive radiation therapy to the right breast.  #4 patient is now on maintenance Herceptin every 3 weeks. For one year completed on 09/17/11  CURRENT THERAPY: observation and surveillance  INTERVAL HISTORY: Laurie Allen 67 y.o. female returns for followup visit today.Overall patient is doing well. Patient is continuing to having issues with reflux. She is followed by Dr. Loreta Ave for this. She is also having some gynecologic issues including uterine prolapse. There was conversation between myself and Dr. Nicholas Lose regarding the use of local estrogens to help her through this difficult situation. We discussed this extensively. He has also spoken to the patient regarding this. At this time I do believe that he has given her a prescription for Estring but patient has not picked up the prescription yet from her pharmacy. I have encouraged her to go ahead and get this and to begin this to see if this indeed does help her. Certainly she will need to be monitored very closely. I do think the benefits from Estring will far outweigh the risks and certainly we can be following her closely.She otherwise feels well no nausea or vomiting no peripheral paresthesias no myalgias or arthralgias. She does occasionally become hyponatremic she is followed by Dr. Nehemiah Settle for this.She has not noticed any masses in her breasts.No easy  bruising. Remainder of the 10 point review of systems is unremarkable. MEDICAL HISTORY: Past Medical History  Diagnosis Date  . Anxiety   . GERD (gastroesophageal reflux disease)   . Hx Breast cancer, IDC, Right, Stage II, Receptor -, Her 2 + 06/05/2010  . Rash 06/04/2011  . Palpitations   . Breast cancer   . GERD (gastroesophageal reflux disease) 12/18/2011    ALLERGIES:  is allergic to compazine.  MEDICATIONS:  Current Outpatient Prescriptions  Medication Sig Dispense Refill  . Ascorbic Acid (VITAMIN C PO) Take by mouth.      . B Complex Vitamins (VITAMIN B-COMPLEX PO) Take by mouth.      . Cholecalciferol (VITAMIN D3) 1000 UNITS CAPS Take 1 capsule by mouth daily.       Marland Kitchen CINNAMON PO Take by mouth.      Marland Kitchen LORazepam (ATIVAN) 0.5 MG tablet       . polyvinyl alcohol (LIQUID TEARS) 1.4 % ophthalmic solution 1 drop as needed.        . zolpidem (AMBIEN) 5 MG tablet Take 1 tablet (5 mg total) by mouth at bedtime as needed for sleep.  30 tablet  0  . [DISCONTINUED] sertraline (ZOLOFT) 25 MG tablet Take 25 mg by mouth daily.       No current facility-administered medications for this visit.    SURGICAL HISTORY:  Past Surgical History  Procedure Laterality Date  . Foot surgery    . Bladder surgery    . Mastectomy partial / lumpectomy w/ axillary lymphadenectomy  01/06/2011    Right- Dr Jamey Ripa  . Portacath placement  06/17/2010  . Breast surgery    .  Port-a-cath removal  10/12/2011    Procedure: REMOVAL PORT-A-CATH;  Surgeon: Currie Paris, MD;  [facility];  Service: General;  Laterality: Right;  . Esophageal manometry  01/18/2012    Procedure: ESOPHAGEAL MANOMETRY (EM);  Surgeon: Charolett Bumpers, MD;  Location: [facility];  Service: Endoscopy;  Laterality: N/A;  . 24 hour ph study  01/18/2012    Procedure: 24 HOUR PH STUDY;  Surgeon: Charolett Bumpers, MD;  Location: [facility];  Service: Endoscopy;  Laterality: N/A;    REVIEW OF SYSTEMS:  Pertinent  items are noted in HPI.   PHYSICAL EXAMINATION:  Well-developed female she looks remarkably well since her last visit with me HEENT exam EOMI PERRLA sclerae anicteric no conjunctival pallor oral mucosa is moist neck is supple lungs are clear cardiovascular regular rate rhythm abdomen is soft nontender nondistended bowel sounds are present at no HSM extremities no edema neuro patient's alert oriented otherwise nonfocal. Bilateral breast examination is performed. Right breast reveals barely visible surgical scar from her lumpectomy there are no masses no nipple retraction or discharge. Left breast no masses or nipple discharge. ECOG PERFORMANCE STATUS: 1 - Symptomatic but completely ambulatory  Blood pressure 137/72, pulse 77, temperature 97.5 F (36.4 C), temperature source Oral, resp. rate 20, height 5\' 5"  (1.651 m), weight 145 lb 4.8 oz (65.908 kg).  LABORATORY DATA: Lab Results  Component Value Date   WBC 4.0 08/22/2012   HGB 12.3 08/22/2012   HCT 36.3 08/22/2012   MCV 93.0 08/22/2012   PLT 211 08/22/2012      Chemistry      Component Value Date/Time   NA 135* 04/18/2012 1031   NA 135 01/31/2012 2001   K 4.1 04/18/2012 1031   K 4.4 01/31/2012 2001   CL 102 04/18/2012 1031   CL 99 01/31/2012 2001   CO2 25 04/18/2012 1031   CO2 26 01/03/2012 2155   BUN 12.0 04/18/2012 1031   BUN 10 01/31/2012 2001   CREATININE 0.9 04/18/2012 1031   CREATININE 0.90 01/31/2012 2001      Component Value Date/Time   CALCIUM 9.9 04/18/2012 1031   CALCIUM 10.1 01/03/2012 2155   ALKPHOS 96 04/18/2012 1031   ALKPHOS 103 01/03/2012 2155   AST 18 04/18/2012 1031   AST 30 01/03/2012 2155   ALT 13 04/18/2012 1031   ALT 16 01/03/2012 2155   BILITOT 0.60 04/18/2012 1031   BILITOT 0.3 01/03/2012 2155       RADIOGRAPHIC STUDIES:  No results found.  ASSESSMENT: 67 year old female with:  1.  stage II ER negative PR negative HER-2/neu positive right breast cancer status post neoadjuvant chemotherapy Consisting of  Taxotere carboplatinum and Herceptin which she completed in June 2012. She then underwent right breast lumpectomy with axillary lymph node dissection. He was only found to have residual disease. All 18 lymph nodes were negative for metastatic disease. After the lumpectomy and axillary lymph node dissection patient went on to receive radiation therapy with Herceptin every 3 weeks. She completed all of her adjuvant Herceptin therapy On 09/17/2011.Overall she did well.Patient has no evidence of recurrent disease clinically.  #2 patient continues to have gastroesophageal reflux disease she is seen by her gastroenterologist.  PLAN:  #1 patient will continue to followup with [place] every 4 months time.  #2 patient is recommended to continue seeing her primary care physician as well as her gastroenterologist for her ongoing medical care and especially her GI symptoms.  #3 patient will be seen  by her gynecologist Dr. Nicholas Lose regarding her ongoing issues with uterine prolapse. I do think it is reasonable given the circumstances that patient began local estrogen since her tumor was ER negative. I have discussed this with the patient myself she is concerned that she is certainly willing to proceed with this. She does understand the risks and benefits and the side effects of this approach certainly in her circumstance the benefits far outweigh the potential for risks. She understands that we will need to monitor her closely she should be doing self breast examinations and we will continue to get her annual diagnostic mammograms. She also knows to call me with any problems questions or concerns.  All questions were answered. The patient knows to call the clinic with any problems, questions or concerns. We can certainly see the patient much sooner if necessary.  I spent 25 minutes counseling the patient face to face. The total time spent in the appointment was 30 minutes.    Drue Second, MD Medical/Oncology [facility] [phone] (beeper) [phone] (Office)  08/22/2012, 1:18 PM

## 2012-08-22 NOTE — Telephone Encounter (Signed)
gv pt appt schedule for August.  °

## 2012-08-22 NOTE — Patient Instructions (Addendum)
Doing well  I will see you back in 6 months 

## 2012-08-23 LAB — VITAMIN D 25 HYDROXY (VIT D DEFICIENCY, FRACTURES): Vit D, 25-Hydroxy: 53 ng/mL (ref 30–89)

## 2012-10-14 ENCOUNTER — Telehealth: Payer: Self-pay | Admitting: Medical Oncology

## 2012-10-14 NOTE — Telephone Encounter (Signed)
Per Augustin Schooling, NP, v.o. Given for mastectomy supplies for patient.

## 2012-10-27 ENCOUNTER — Encounter (HOSPITAL_COMMUNITY): Payer: Self-pay | Admitting: *Deleted

## 2012-10-27 ENCOUNTER — Emergency Department (HOSPITAL_COMMUNITY)
Admission: EM | Admit: 2012-10-27 | Discharge: 2012-10-27 | Disposition: A | Discharge status: Home / Self Care | Payer: Medicare Other | Source: Home / Self Care | Attending: Family Medicine | Admitting: Family Medicine

## 2012-10-27 DIAGNOSIS — R079 Chest pain, unspecified: Secondary | ICD-10-CM

## 2012-10-27 DIAGNOSIS — K219 Gastro-esophageal reflux disease without esophagitis: Secondary | ICD-10-CM

## 2012-10-27 DIAGNOSIS — IMO0001 Reserved for inherently not codable concepts without codable children: Secondary | ICD-10-CM

## 2012-10-27 DIAGNOSIS — R42 Dizziness and giddiness: Secondary | ICD-10-CM

## 2012-10-27 MED ORDER — GI COCKTAIL ~~LOC~~
ORAL | Status: AC
  Filled 2012-10-27: qty 30

## 2012-10-27 MED ORDER — GI COCKTAIL ~~LOC~~
30.0000 mL | Freq: Once | ORAL | Status: AC
  Administered 2012-10-27: 30 mL via ORAL

## 2012-10-27 NOTE — ED Notes (Addendum)
C/o burning in her chest and palpations onset this morning. Hx. acid reflux and feels it everyday since the chemo for stage 2 breast cancer.  Started gradually this morning and got worse. She could hear her heart beating. No nausea or SOB.  No sweating. Feels a little tightness and a twinge over her L chest.  She took Tums @ 1400 and an anxiety medication @ 1445  without relief.  Still feels the burning.

## 2012-10-27 NOTE — ED Provider Notes (Signed)
History     CSN: [id number]  Arrival date & time 10/27/12  1530   First MD Initiated Contact with Patient 10/27/12 1611      Chief Complaint  Patient presents with  . Chest Pain    HPI: Patient is a 67 y.o. female presenting with chest pain. The history is provided by the patient.  Chest Pain Pain location:  L chest and epigastric Pain quality: burning and tightness   Pain radiates to:  Does not radiate Pain radiates to the back: no   Onset quality:  Gradual Duration:  10 hours Timing:  Constant Progression:  Improving Chronicity:  Recurrent Context: not breathing, no drug use, not eating, no intercourse, not lifting, no movement, not raising an arm, not at rest, no stress and no trauma   Relieved by:  Antacids Worsened by:  Nothing tried Associated symptoms: anxiety, dizziness, heartburn and palpitations   Associated symptoms: no altered mental status, no back pain, no cough, no diaphoresis, no fever, no lower extremity edema, no nausea, no near-syncope, no shortness of breath, not vomiting and no weakness   Risk factors: no coronary artery disease, no diabetes mellitus, no high cholesterol and no hypertension   Pt is a somewhat vague historian that reports persistent epigastric "burning" since this am that has been associated with "palpitations" and bounding heart rate. She has also had (L) sided chest tightness. Pt states she has similar symptoms frequently related to severe acid  "reflux" that she has had since her chemo in 2013. Pt is s/p stage 2 breast cancer, lumpectomy 2012 and last round of chemo in [address]. Normally she takes Tums and her "anxiety" medication (Ativan 0.5mg ) and symptoms resolve. Today after symptoms persisted she took her Tums and Ativan but got no relief though she admits symptoms have improved somewhat since arrival to Urgent Care.  She currently reports her (L) sided CP is 1-2/10.  Pt also reports intermittent dizziness that is new. She denies n/v, SOB,  diaphoresis, cough, or other associated symptoms.   Past Medical History  Diagnosis Date  . Anxiety   . GERD (gastroesophageal reflux disease)   . Hx Breast cancer, IDC, Right, Stage II, Receptor -, Her 2 + 06/05/2010  . Rash 06/04/2011  . Palpitations   . Breast cancer   . GERD (gastroesophageal reflux disease) 12/18/2011    Past Surgical History  Procedure Laterality Date  . Foot surgery    . Bladder surgery    . Mastectomy partial / lumpectomy w/ axillary lymphadenectomy  01/06/2011    Right- Dr Jamey Ripa  . Portacath placement  06/17/2010  . Breast surgery    . Port-a-cath removal  10/12/2011    Procedure: REMOVAL PORT-A-CATH;  Surgeon: Currie Paris, MD;  Location: [facility];  Service: General;  Laterality: Right;  . Esophageal manometry  01/18/2012    Procedure: ESOPHAGEAL MANOMETRY (EM);  Surgeon: Charolett Bumpers, MD;  Location: [facility];  Service: Endoscopy;  Laterality: N/A;  . 24 hour ph study  01/18/2012    Procedure: 24 HOUR PH STUDY;  Surgeon: Charolett Bumpers, MD;  Location: [facility];  Service: Endoscopy;  Laterality: N/A;    Family History  Problem Relation Age of Onset  . Cancer Father 34    ? type    History  Substance Use Topics  . Smoking status: Never Smoker   . Smokeless tobacco: Never Used  . Alcohol Use: No    OB History   Grav Para Term Preterm  Abortions TAB SAB Ect Mult Living                  Review of Systems  Constitutional: Negative.  Negative for fever and diaphoresis.  HENT: Negative.   Eyes: Negative.   Respiratory: Negative.  Negative for cough and shortness of breath.   Cardiovascular: Positive for chest pain and palpitations. Negative for near-syncope.  Gastrointestinal: Positive for heartburn. Negative for nausea and vomiting.  Endocrine: Negative.   Genitourinary: Negative.   Musculoskeletal: Negative.  Negative for back pain.  Allergic/Immunologic: Negative.   Neurological: Positive for dizziness.  Negative for weakness.  Hematological: Negative.   Psychiatric/Behavioral: Negative.  Negative for altered mental status.    Allergies  Compazine  Home Medications   Current Outpatient Rx  Name  Route  Sig  Dispense  Refill  . B Complex Vitamins (VITAMIN B-COMPLEX PO)   Oral   Take by mouth.         . calcium carbonate (TUMS - DOSED IN MG ELEMENTAL CALCIUM) 500 MG chewable tablet   Oral   Chew 1 tablet by mouth daily.         Marland Kitchen CINNAMON PO   Oral   Take by mouth.         Marland Kitchen LORazepam (ATIVAN) 0.5 MG tablet               . polyvinyl alcohol (LIQUID TEARS) 1.4 % ophthalmic solution      1 drop as needed.           . zolpidem (AMBIEN) 5 MG tablet   Oral   Take 1 tablet (5 mg total) by mouth at bedtime as needed for sleep.   30 tablet   0   . Ascorbic Acid (VITAMIN C PO)   Oral   Take by mouth.         . Cholecalciferol (VITAMIN D3) 1000 UNITS CAPS   Oral   Take 1 capsule by mouth daily.            BP 127/50  Pulse 74  Temp(Src) 97.7 F (36.5 C) (Oral)  Resp 16  SpO2 100%  Physical Exam  Constitutional: She is oriented to person, place, and time. She appears well-developed and well-nourished.  HENT:  Head: Normocephalic and atraumatic.  Eyes: Conjunctivae are normal.  Neck: Neck supple.  Cardiovascular: Normal rate and regular rhythm.   Pulmonary/Chest: Effort normal and breath sounds normal.  Musculoskeletal: Normal range of motion.  Neurological: She is alert and oriented to person, place, and time. She has normal strength. No cranial nerve deficit. Gait normal. GCS eye subscore is 4. GCS verbal subscore is 5. GCS motor subscore is 6.  Skin: Skin is warm and dry.    ED Course  Procedures (including critical care time)   Date: 10/27/2012  Rate: 65  Rhythm: NSR  QRS Axis: Normal  Intervals: Normal  ST/T Wave abnormalities: None  Conduction Disutrbances: None  Narrative Interpretation: Normal EKG  Old EKG Reviewed:  Unchanged    Labs Reviewed - No data to display No results found.   No diagnosis found.    MDM  Patient presents with complaint epigastric burning, left-sided chest tightness and intermittent dizziness today. Patient states she has these symptoms almost on a daily basis since chemotherapy approximately one year ago. However intermittent dizziness is new. Usually Ativan and Tums help relieve her symptoms but did not today. Denies SOB, N/V or other associated symptoms. EKG without acute changes when compared  to previous EKG. No focal neurological findings. Patient reports some relief of epigastric burning w/ GI cocktail but continues to report left sided chest tightness on a scale of 1-2/10. Discussed findings with patient and our recommendation to have her transferred to [facility] ED for further evaluation but patient has declined. Benefits of further evaluation and risks of not getting further evaluation discussed with patient including worsening symptoms, heart attack and possible death. Patient verbalized understanding and willingly signed AMA form.  Patient was encouraged to arrange followup with her primary care physician (Dr. Nehemiah Settle), and patient has agreed.          Leanne Chang, NP 10/27/12 1801

## 2012-10-27 NOTE — ED Notes (Signed)
Pt. refused to go to the ED. Pt. signed AMA form.  Pt. states she is to weary.  Pt.instructed to called Dr. Nehemiah Settle in the AM for a f/u appointment. Tell him you were here and we did an EKG and gave you a GI cocktail.  Pt. voiced understanding.

## 2012-11-04 NOTE — ED Provider Notes (Signed)
Medical screening examination/treatment/procedure(s) were performed by resident physician or non-physician practitioner and as supervising physician I was immediately available for consultation/collaboration.   Barkley Bruns MD.   Linna Hoff, MD 11/04/12 1023

## 2012-11-15 ENCOUNTER — Other Ambulatory Visit: Payer: Self-pay | Admitting: Oncology

## 2012-11-15 DIAGNOSIS — Z9889 Other specified postprocedural states: Secondary | ICD-10-CM

## 2012-11-15 DIAGNOSIS — Z853 Personal history of malignant neoplasm of breast: Secondary | ICD-10-CM

## 2012-12-16 ENCOUNTER — Other Ambulatory Visit: Payer: Self-pay | Admitting: Oncology

## 2012-12-16 DIAGNOSIS — Z9889 Other specified postprocedural states: Secondary | ICD-10-CM

## 2012-12-16 DIAGNOSIS — Z853 Personal history of malignant neoplasm of breast: Secondary | ICD-10-CM

## 2012-12-23 ENCOUNTER — Ambulatory Visit
Admission: RE | Admit: 2012-12-23 | Discharge: 2012-12-23 | Disposition: A | Discharge status: Home / Self Care | Payer: Medicare Other | Source: Ambulatory Visit | Attending: Oncology | Admitting: Oncology

## 2012-12-23 DIAGNOSIS — Z853 Personal history of malignant neoplasm of breast: Secondary | ICD-10-CM

## 2012-12-23 DIAGNOSIS — Z9889 Other specified postprocedural states: Secondary | ICD-10-CM

## 2013-01-20 ENCOUNTER — Telehealth: Payer: Self-pay | Admitting: Oncology

## 2013-01-20 NOTE — Telephone Encounter (Signed)
Faxed pt medical records to  [employer] [facility] ° °

## 2013-01-30 ENCOUNTER — Encounter (INDEPENDENT_AMBULATORY_CARE_PROVIDER_SITE_OTHER): Payer: Self-pay | Admitting: Surgery

## 2013-02-20 ENCOUNTER — Encounter: Payer: Self-pay | Admitting: Oncology

## 2013-02-20 ENCOUNTER — Other Ambulatory Visit (HOSPITAL_BASED_OUTPATIENT_CLINIC_OR_DEPARTMENT_OTHER): Payer: Medicare Other | Admitting: Lab

## 2013-02-20 ENCOUNTER — Ambulatory Visit (HOSPITAL_BASED_OUTPATIENT_CLINIC_OR_DEPARTMENT_OTHER): Payer: Medicare Other | Admitting: Oncology

## 2013-02-20 ENCOUNTER — Telehealth: Payer: Self-pay | Admitting: Oncology

## 2013-02-20 VITALS — BP 115/70 | HR 73 | Temp 97.9°F | Resp 20 | Ht 65.0 in | Wt 147.4 lb

## 2013-02-20 DIAGNOSIS — C50911 Malignant neoplasm of unspecified site of right female breast: Secondary | ICD-10-CM

## 2013-02-20 DIAGNOSIS — K219 Gastro-esophageal reflux disease without esophagitis: Secondary | ICD-10-CM

## 2013-02-20 DIAGNOSIS — Z853 Personal history of malignant neoplasm of breast: Secondary | ICD-10-CM

## 2013-02-20 DIAGNOSIS — Z171 Estrogen receptor negative status [ER-]: Secondary | ICD-10-CM

## 2013-02-20 LAB — CBC WITH DIFFERENTIAL/PLATELET
BASO%: 0.6 % (ref 0.0–2.0)
Basophils Absolute: 0 10*3/uL (ref 0.0–0.1)
EOS%: 1.8 % (ref 0.0–7.0)
Eosinophils Absolute: 0.1 10*3/uL (ref 0.0–0.5)
HCT: 36.8 % (ref 34.8–46.6)
HGB: 12.3 g/dL (ref 11.6–15.9)
LYMPH%: 29.6 % (ref 14.0–49.7)
MCH: 31.1 pg (ref 25.1–34.0)
MCHC: 33.5 g/dL (ref 31.5–36.0)
MCV: 93 fL (ref 79.5–101.0)
MONO#: 0.4 10*3/uL (ref 0.1–0.9)
MONO%: 9.7 % (ref 0.0–14.0)
NEUT#: 2.2 10*3/uL (ref 1.5–6.5)
NEUT%: 58.3 % (ref 38.4–76.8)
Platelets: 221 10*3/uL (ref 145–400)
RBC: 3.96 10*6/uL (ref 3.70–5.45)
RDW: 14 % (ref 11.2–14.5)
WBC: 3.7 10*3/uL — ABNORMAL LOW (ref 3.9–10.3)
lymph#: 1.1 10*3/uL (ref 0.9–3.3)

## 2013-02-20 LAB — COMPREHENSIVE METABOLIC PANEL (CC13)
ALT: 12 U/L (ref 0–55)
Albumin: 3.8 g/dL (ref 3.5–5.0)
BUN: 16.8 mg/dL (ref 7.0–26.0)
CO2: 24 mEq/L (ref 22–29)
Calcium: 9.7 mg/dL (ref 8.4–10.4)
Chloride: 104 mEq/L (ref 98–109)
Creatinine: 0.9 mg/dL (ref 0.6–1.1)
Potassium: 4.3 mEq/L (ref 3.5–5.1)

## 2013-02-20 NOTE — Telephone Encounter (Signed)
, °

## 2013-02-20 NOTE — Patient Instructions (Addendum)
Doing well no evidence of recurrent cancer.  Followup mammogram in 6 months time. Please call the breast Center in November to set this up for yourself  I will see back in 6 months time for followup

## 2013-02-20 NOTE — Progress Notes (Signed)
OFFICE PROGRESS NOTE  CC Dr. Beather Arbour Dr. Cyndia Bent Dr. Juanetta Beets, MD [address] [place] [address]  DIAGNOSIS: 67 year old female with locally advanced ER negative PR negative HER-2/neu positive right breast cancer.  PRIOR THERAPY:  #1 patient underwent neoadjuvant chemotherapy initially consisting of TCH.  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  #3 patient then went on to receive radiation therapy to the right breast.  #4 patient is now on maintenance Herceptin every 3 weeks. For one year completed on 09/17/11  CURRENT THERAPY: observation and surveillance  INTERVAL HISTORY: Laurie Allen 67 y.o. female returns for followup visit today.She otherwise feels well no nausea or vomiting no peripheral paresthesias no myalgias or arthralgias. She still continues to be quite anxious and nervous. She is also continuing to have acid reflux symptoms when she becomes very anxious. She is taking lorazepam. We discussed the implications of long-term use of lorazepam including dependence. She will continue to followup with her primary care doctor. Her main of the 10 point review of systems is negative. MEDICAL HISTORY: Past Medical History  Diagnosis Date  . Anxiety   . GERD (gastroesophageal reflux disease)   . Hx Breast cancer, IDC, Right, Stage II, Receptor -, Her 2 + 06/05/2010  . Rash 06/04/2011  . Palpitations   . Breast cancer   . GERD (gastroesophageal reflux disease) 12/18/2011    ALLERGIES:  is allergic to compazine.  MEDICATIONS:  Current Outpatient Prescriptions  Medication Sig Dispense Refill  . B Complex Vitamins (VITAMIN B-COMPLEX PO) Take by mouth.      . Cholecalciferol (VITAMIN D3) 1000 UNITS CAPS Take 1 capsule by mouth daily.       Marland Kitchen CINNAMON PO Take by  mouth.      Marland Kitchen LORazepam (ATIVAN) 0.5 MG tablet       . Multiple Vitamin (MULTI-VITAMIN PO) Take by mouth.      . pantoprazole (PROTONIX) 40 MG tablet Take 40 mg by mouth daily.      . polyvinyl alcohol (LIQUID TEARS) 1.4 % ophthalmic solution 1 drop as needed.        . zolpidem (AMBIEN) 5 MG tablet Take 1 tablet (5 mg total) by mouth at bedtime as needed for sleep.  30 tablet  0  . calcium carbonate (TUMS - DOSED IN MG ELEMENTAL CALCIUM) 500 MG chewable tablet Chew 1 tablet by mouth daily.      . [DISCONTINUED] sertraline (ZOLOFT) 25 MG tablet Take 25 mg by mouth daily.       No current facility-administered medications for this visit.    SURGICAL HISTORY:  Past Surgical History  Procedure Laterality Date  . Foot surgery    . Bladder surgery    . Mastectomy partial / lumpectomy w/ axillary lymphadenectomy  01/06/2011    Right- Dr Jamey Ripa  . Portacath placement  06/17/2010  . Port-a-cath removal  10/12/2011    Procedure: REMOVAL PORT-A-CATH;  Surgeon: Currie Paris, MD;  Location: [facility];  Service: General;  Laterality: Right;  . Esophageal manometry  01/18/2012    Procedure: ESOPHAGEAL MANOMETRY (EM);  Surgeon: Charolett Bumpers, MD;  Location: [facility];  Service: Endoscopy;  Laterality: N/A;  . 24 hour ph study  01/18/2012    Procedure: 24 HOUR PH STUDY;  Surgeon: Charolett Bumpers, MD;  Location: [facility];  Service: Endoscopy;  Laterality: N/A;  . Breast surgery    . Breast lumpectomy Right 2012    with lymph nodes    REVIEW OF SYSTEMS:  Pertinent items are noted in HPI.   PHYSICAL EXAMINATION:  Well-developed female she looks remarkably well since her last visit with me HEENT exam EOMI PERRLA sclerae anicteric no conjunctival pallor oral mucosa is moist neck is supple lungs are clear cardiovascular regular rate rhythm abdomen is soft nontender nondistended bowel sounds are present at no HSM extremities no edema neuro patient's alert oriented otherwise  nonfocal. Bilateral breast examination is performed. Right breast reveals barely visible surgical scar from her lumpectomy there are no masses no nipple retraction or discharge. Left breast no masses or nipple discharge. ECOG PERFORMANCE STATUS: 1 - Symptomatic but completely ambulatory  Blood pressure 115/70, pulse 73, temperature 97.9 F (36.6 C), temperature source Oral, resp. rate 20, height 5\' 5"  (1.651 m), weight 147 lb 6.4 oz (66.86 kg).  LABORATORY DATA: Lab Results  Component Value Date   WBC 3.7* 02/20/2013   HGB 12.3 02/20/2013   HCT 36.8 02/20/2013   MCV 93.0 02/20/2013   PLT 221 02/20/2013      Chemistry      Component Value Date/Time   NA 140 02/20/2013 1133   NA 135 01/31/2012 2001   K 4.3 02/20/2013 1133   K 4.4 01/31/2012 2001   CL 104 08/22/2012 1246   CL 99 01/31/2012 2001   CO2 24 02/20/2013 1133   CO2 26 01/03/2012 2155   BUN 16.8 02/20/2013 1133   BUN 10 01/31/2012 2001   CREATININE 0.9 02/20/2013 1133   CREATININE 0.90 01/31/2012 2001      Component Value Date/Time   CALCIUM 9.7 02/20/2013 1133   CALCIUM 10.1 01/03/2012 2155   ALKPHOS 86 02/20/2013 1133   ALKPHOS 103 01/03/2012 2155   AST 20 02/20/2013 1133   AST 30 01/03/2012 2155   ALT 12 02/20/2013 1133   ALT 16 01/03/2012 2155   BILITOT 0.55 02/20/2013 1133   BILITOT 0.3 01/03/2012 2155       RADIOGRAPHIC STUDIES:  No results found.  ASSESSMENT: 67 year old female with:  #1.  stage II ER negative PR negative HER-2/neu positive right breast cancer status post neoadjuvant chemotherapy Consisting of Taxotere carboplatinum and Herceptin which she completed in June 2012. She then underwent right breast lumpectomy with axillary lymph node dissection. He was only found to have residual disease. All 18 lymph nodes were negative for metastatic disease. After the lumpectomy and axillary lymph node dissection patient went on to receive radiation therapy with Herceptin every 3 weeks. She completed all of her adjuvant Herceptin therapy  On 09/17/2011.Overall she did well.Patient has no evidence of recurrent disease clinically.  #2 patient continues to have gastroesophageal reflux disease she is seen by her gastroenterologist.  #3 vaginal atrophy being seen by Dr. Nicholas Lose  PLAN:  #1Doing well no evidence of recurrent disease  #2 patient had a mammogram performed in June 2014 which revealed postoperative changes in the right breast as well with benign appearing calcifications. The short-term followup mammogram is recommended in 6 months.  #3 I will see her back in 6 months.   All questions were answered. The patient knows to call the clinic with any problems, questions or concerns. We can certainly see the patient much sooner if necessary.  I spent 25 minutes counseling the patient face to face. The total time spent in the appointment was  30 minutes.    Drue Second, MD Medical/Oncology [facility] [phone] (beeper) [phone] (Office)  02/20/2013, 12:31 PM

## 2013-03-24 ENCOUNTER — Encounter (INDEPENDENT_AMBULATORY_CARE_PROVIDER_SITE_OTHER): Payer: Self-pay | Admitting: Surgery

## 2013-03-24 ENCOUNTER — Ambulatory Visit (INDEPENDENT_AMBULATORY_CARE_PROVIDER_SITE_OTHER): Payer: Medicare Other | Admitting: Surgery

## 2013-03-24 VITALS — BP 120/68 | HR 72 | Temp 97.8°F | Resp 14 | Ht 65.0 in | Wt 148.8 lb

## 2013-03-24 DIAGNOSIS — Z853 Personal history of malignant neoplasm of breast: Secondary | ICD-10-CM

## 2013-03-24 NOTE — Patient Instructions (Signed)
Continue annual mammograms and follow ups 

## 2013-03-24 NOTE — Progress Notes (Signed)
NAME: Laurie Allen       DOB: October 22, 1945           DATE: 03/24/2013       MRN: [id number]   Laurie Allen is a 67 y.o.Marland Kitchenfemale who presents for routine followup of her Right breast cancer, IDC, Stage 2, Receptor-, Her2+ diagnosed in 2011 and treated with neoadjuvant chemo, lumpectomy, radiation. She has no problems or concerns on either side except some chronic right axillary discomfort  PFSH: She has had no significant changes since the last visit here.  ROS: There have been no significant changes since the last visit here  EXAM: General: The patient is alert, oriented, generally healty appearing, NAD. Mood and affect are normal.  Breasts:  Right side shows radiation changes but no evidence of recurrence. The is slight dimpling at the lumpectomy site c/w loss of tissue Lymphatics: She has no axillary or supraclavicular adenopathy on either side.  Extremities: Full ROM of the surgical side with no lymphedema noted.  Data Reviewed: Mammogram in June, 2014: DIGITAL DIAGNOSTIC BILATERAL MAMMOGRAM WITH CAD  Comparison: 12/23/2011 and earlier  Findings:  ACR Breast Density Category c: The breasts are heterogeneously  dense, which may obscure small masses.  Post lumpectomy changes are identified in the upper-outer quadrant  of the right breast. Just anterior to the lumpectomy site, faint  calcifications are identified, further evaluated with magnified  views. On these views, calcifications are related to a blood  vessel, consistent with vascular benign calcifications. No  suspicious microcalcifications are identified. Left breast is  negative.  Mammographic images were processed with CAD.  IMPRESSION:  1. Postoperative changes on the right.  2. Benign-appearing calcifications in the right breast. Follow-up  is recommended.  RECOMMENDATION:  Follow-up right mammogram is suggested in 6 months to assess  stability of right breast calcifications.  I have discussed the findings and  recommendations with the patient.  Results were also provided in writing at the conclusion of the  visit. If applicable, a reminder letter will be sent to the  patient regarding her next appointment.  BI-RADS CATEGORY 3: Probably benign finding(s) - short interval  follow-up suggested.  Original Report Authenticated By: Norva Pavlov, M.D.    Impression: Doing well, with no evidence of recurrent cancer or new cancer  Plan: Will see in one year

## 2013-05-04 ENCOUNTER — Other Ambulatory Visit: Payer: Self-pay

## 2013-06-26 ENCOUNTER — Other Ambulatory Visit: Payer: Self-pay | Admitting: Oncology

## 2013-06-26 DIAGNOSIS — R921 Mammographic calcification found on diagnostic imaging of breast: Secondary | ICD-10-CM

## 2013-07-04 ENCOUNTER — Ambulatory Visit
Admission: RE | Admit: 2013-07-04 | Discharge: 2013-07-04 | Disposition: A | Discharge status: Home / Self Care | Payer: Medicare Other | Source: Ambulatory Visit | Attending: Oncology | Admitting: Oncology

## 2013-07-04 DIAGNOSIS — R921 Mammographic calcification found on diagnostic imaging of breast: Secondary | ICD-10-CM

## 2013-08-17 ENCOUNTER — Other Ambulatory Visit (HOSPITAL_BASED_OUTPATIENT_CLINIC_OR_DEPARTMENT_OTHER): Payer: Medicare Other

## 2013-08-17 ENCOUNTER — Telehealth: Payer: Self-pay | Admitting: *Deleted

## 2013-08-17 ENCOUNTER — Ambulatory Visit (HOSPITAL_BASED_OUTPATIENT_CLINIC_OR_DEPARTMENT_OTHER): Payer: Medicare Other | Admitting: Oncology

## 2013-08-17 ENCOUNTER — Encounter: Payer: Self-pay | Admitting: Oncology

## 2013-08-17 VITALS — BP 137/72 | HR 83 | Temp 98.3°F | Resp 20 | Ht 65.0 in | Wt 152.4 lb

## 2013-08-17 DIAGNOSIS — C50911 Malignant neoplasm of unspecified site of right female breast: Secondary | ICD-10-CM

## 2013-08-17 DIAGNOSIS — F411 Generalized anxiety disorder: Secondary | ICD-10-CM

## 2013-08-17 DIAGNOSIS — C50519 Malignant neoplasm of lower-outer quadrant of unspecified female breast: Secondary | ICD-10-CM

## 2013-08-17 DIAGNOSIS — K219 Gastro-esophageal reflux disease without esophagitis: Secondary | ICD-10-CM

## 2013-08-17 DIAGNOSIS — I890 Lymphedema, not elsewhere classified: Secondary | ICD-10-CM

## 2013-08-17 DIAGNOSIS — C50919 Malignant neoplasm of unspecified site of unspecified female breast: Secondary | ICD-10-CM

## 2013-08-17 LAB — CBC WITH DIFFERENTIAL/PLATELET
BASO%: 0.6 % (ref 0.0–2.0)
BASOS ABS: 0 10*3/uL (ref 0.0–0.1)
EOS%: 1.6 % (ref 0.0–7.0)
Eosinophils Absolute: 0.1 10*3/uL (ref 0.0–0.5)
HCT: 37.9 % (ref 34.8–46.6)
HGB: 12.4 g/dL (ref 11.6–15.9)
LYMPH%: 25.3 % (ref 14.0–49.7)
MCH: 30.5 pg (ref 25.1–34.0)
MCHC: 32.8 g/dL (ref 31.5–36.0)
MCV: 93.1 fL (ref 79.5–101.0)
MONO#: 0.3 10*3/uL (ref 0.1–0.9)
MONO%: 6.9 % (ref 0.0–14.0)
NEUT#: 2.8 10*3/uL (ref 1.5–6.5)
NEUT%: 65.6 % (ref 38.4–76.8)
Platelets: 214 10*3/uL (ref 145–400)
RBC: 4.07 10*6/uL (ref 3.70–5.45)
RDW: 14.1 % (ref 11.2–14.5)
WBC: 4.2 10*3/uL (ref 3.9–10.3)
lymph#: 1.1 10*3/uL (ref 0.9–3.3)

## 2013-08-17 LAB — COMPREHENSIVE METABOLIC PANEL (CC13)
ALK PHOS: 86 U/L (ref 40–150)
ALT: 15 U/L (ref 0–55)
AST: 22 U/L (ref 5–34)
Albumin: 4.1 g/dL (ref 3.5–5.0)
Anion Gap: 10 mEq/L (ref 3–11)
BILIRUBIN TOTAL: 0.38 mg/dL (ref 0.20–1.20)
BUN: 20.2 mg/dL (ref 7.0–26.0)
CO2: 25 mEq/L (ref 22–29)
Calcium: 9.9 mg/dL (ref 8.4–10.4)
Chloride: 105 mEq/L (ref 98–109)
Creatinine: 0.8 mg/dL (ref 0.6–1.1)
Glucose: 123 mg/dl (ref 70–140)
Potassium: 4.2 mEq/L (ref 3.5–5.1)
SODIUM: 140 meq/L (ref 136–145)
Total Protein: 7.3 g/dL (ref 6.4–8.3)

## 2013-08-17 NOTE — Patient Instructions (Signed)
Breast Cancer Survivor Follow-Up Breast cancer begins when cells in the breast divide too rapidly. The extra cells form a lump (tumor). When the cancer is treated, the goal is to get rid of all cancer cells. However, sometimes a few cells survive. These cancer cells can then grow. They become recurrent cancer. This means the cancer comes back after treatment.  Most cases of recurrent breast cancer develop 3 to 5 years after treatment. However, sometimes it comes back just a few months after treatment. Other times, it does not come back until years later. If the cancer comes back in the same area as the first breast cancer, it is called a local recurrence. If the cancer comes back somewhere else in the body, it is called regional recurrence if the site is fairly near the breast or distant recurrence if it is far from the breast. Your caregiver may also use the term metastasize to indicate a cancer that has gone to another part of your body. Treatment is still possible after either kind of recurrence. The cancer can still be controlled.  CAUSES OF RECURRENT CANCER No one knows exactly why breast cancer starts in the first place. Why the cancer comes back after treatment is also not clear. It is known that certain conditions, called risk factors, can make this more likely. They include:  Developing breast cancer for the first time before age 60.  Having breast cancer that involves the lymph nodes. These are small, round pieces of tissue found all over the body. Their job is to help fight infections.  Having a large tumor. Cancer is more apt to come back if the first tumor was bigger than 2 inches (5 cm).  Having certain types of breast cancer, such as:  Inflammatory breast cancer. This rare type grows rapidly and causes the breast to become red and swollen.  A high-grade tumor. The grade of a tumor indicates how fast it will grow and spread. High-grade tumors grow more quickly than other types.  HER2  cancer. This refers to the tumor's genetic makeup. Tumors that have this type of gene are more likely to come back after treatment.  Having close tumor margins. This refers to the space between the tumor and normal, noncancerous cells. If the space is small, the tumor has a greater chance of coming back.  Having treatment involving a surgery to remove the tumor but not the entire breast (lumpectomy) and no radiation therapy. CARE AFTER BREAST CANCER Home Monitoring Women who have had breast cancer should continue to examine their breasts every month. The goal is to catch the cancer quickly if it comes back. Many women find it helpful to do so on the same day each month and to mark the calendar as a reminder. Let your caregiver know immediately if you have any signs of recurrent breast cancer. Symptoms will vary, depending on where the cancer recurs. The original type of treatment can also make a difference. Symptoms of local recurrence after a lumpectomy or a recurrence in the opposite breast may include:  A new lump or thickening in the breast.  A change in the way the skin looks on the breast (such as a rash, dimpling, or wrinkling).  Redness or swelling of the breast.  Changes in the nipple (such as being red, puckered, swollen, or leaking fluid). Symptoms of a recurrence after a breast removal surgery (mastectomy) may include:  A lump or thickening under the skin.  A thickening around the mastectomy scar. Symptoms   of regional recurrence in the lymph nodes near the breast may include:  A lump under the arm or above the collarbone.  Swelling of the arm.  Pain in the arm, shoulder, or chest.  Numbness in the hand or arm. Symptoms of distant recurrence may include:  A cough that does not go away.  Trouble breathing or shortness of breath.  Pain in the bones or the chest. This is pain that lasts or does not respond to rest and medicine.  Headaches.  Sudden vision  problems.  Dizziness.  Nausea or vomiting.  Losing weight without trying to.  Persistent abdominal pain.  Changes in bowel movements or blood in the stool.  Yellowing of the skin or eyes (jaundice).  Blood in the urine or bloody vaginal discharge. Clinical Monitoring  It is helpful to keep a schedule of appointments for needed tests and exams. This includes physical exams, breast exams, exams of the lymph nodes, and general exams.  For the first 3 years after being treated for breast cancer, see your caregiver every 3 to 6 months.  For years 4 and 5 after breast cancer, see your caregiver every 6 to 12 months.  After 5 years, see your caregiver at least once a year.  Regular breast X-rays (mammograms) should continue even if you had a mastectomy.  A mammogram should be done 1 year after the mammogram that first detected breast cancer.  A mammogram should be done every 6 to 12 months after that. Follow your caregiver's advice.  A pelvic exam done by your caregiver checks whether female organs are the normal size and shape. The exam is usually done every year. Ask your caregiver if that schedule is right for you.  Women taking tamoxifen should report any vaginal bleeding immediately to their caregiver. Tamoxifen is often given to women with a certain type of breast cancer. It has been shown to help prevent recurrence.  You will need to decide who your primary caregiver will be.  Most people continue to see their cancer specialist (oncologist) every 3 to 6 months for the first year after cancer treatment.  At some point, you may want to go back to seeing your family caregiver. You would no longer see your oncologist for regular checkups. Many women do this about 1 year after their first diagnosis of breast cancer.  You will still need to be seen every so often by your oncologist. Ask how often that should be. Coordinate this with your family or primary caregiver.  Think about  having genetic counseling. This would provide information on traits that can be passed or inherited from one generation to the next. In some cases, breast cancer runs in families. Tell your caregiver if you:  Are of Ashkenazi Jewish heritage.  Have any family member who has had ovarian cancer.  Have a mother, sister, or daughter who had breast cancer before age 7.  Have 2 or more close relatives who have had breast cancer. This means a mother, sister, daughter, aunt, or grandmother.  Had breast cancer in both breasts.  Have a female relative who has had breast cancer.  Some tests are not recommended for routine screening. Someone recovering from breast cancer does not need to have these tests if there are no problems. The tests have risks, such as radiation exposure, and can be costly. The risks of these tests are thought to be greater than the benefits:  Blood tests.  Chest X-rays.  Bone scans.  Liver ultrasound.  Computed tomography (CT scan).  Positron emission tomography (PET scan).  Magnetic resonance imaging (MRI scan). DIAGNOSIS OF RECURRENT CANCER Recurrent breast cancer may be suspected for various reasons. A mammogram may not look normal. You might feel a lump or have other symptoms. Your caregiver may find something unusual during an exam. To be sure, your caregiver will probably order some tests. The tests are needed because there are symptoms or hints of a problem. They could include:  Blood tests, including a test to check how well the liver is working. The liver is a common site for a distant cancer recurrence.  Imaging tests that create pictures of the inside of the body. These tests include:  Chest X-rays to show if the cancer has come back in the lungs.  CT scans to create detailed pictures of various areas of the body and help find a distant recurrence.  MRI scans to find anything unusual in the breast, chest, or lymph nodes.  Breast ultrasound tests to  examine the breasts.  Bone scans to create a picture of your whole skeleton and find cancer in bony areas.  PET scans to create an image of the whole body. PET scans can be used together with CT scans to show more detail.  Biopsy. A small sample of tissue is taken and checked under a microscope. If cancer cells are found, they may be tested to see if they contain the HER2 gene or the hormones estrogen and progesterone. This will help your caregiver decide how to treat the recurrent cancer. TREATMENT  How recurrent breast cancer is treated depends on where the new cancer is found. The type of treatment that was used for the first breast cancer makes a difference, too. A combination of treatments may be used. Options include:  Surgery.  If the cancer comes back in the breast that was not treated before, you may need a lumpectomy or mastectomy.  If the cancer comes back in the breast that was treated before, you may need a mastectomy.  The lymph nodes under the arm may need to be removed.  Radiation therapy.  For a local recurrence, radiation may be used if it was not used during the first treatment.  For a distance recurrence, radiation is sometimes used.  Chemotherapy.  This may be used before surgery to treat recurrent breast cancer.  This may be used to treat recurrent cancer that cannot be treated with surgery.  This may be used to treat a distant recurrence.  Hormone therapy.  Women with the HER2 gene may be given hormone therapy to attack this gene. Document Released: 02/11/2011 Document Revised: 09/07/2011 Document Reviewed: 02/11/2011 [facility] Patient Information 2014 [place], [place].  Lymphedema Lymphedema is a swelling caused by the abnormal collection of lymph under the skin. The lymph is fluid from the tissues in your body that travels in the lymphatic system. This system is part of the immune system that includes lymph nodes and vessels. The lymph vessels collect and  carry the excess fluid, fats, proteins, and wastes from the tissues of the body to the bloodstream. This system also works to clean and remove bacteria and waste products from the body.  Lymphedema occurs when the lymphatic system is blocked. When the lymph vessels or lymph nodes are blocked or damaged, lymph does not drain properly. This causes abnormal build up of lymph. This leads to swelling in the arms or legs. Lymphedema cannot be cured by medicines. But the swelling can be reduced by physical  methods. CAUSES  There are two types of lymphedema. Primary lymphedema is caused by the absence or abnormality of the lymph vessel at birth. It is also known as inherited lymphedema, which occurs rarely. Secondary or acquired lymphedema occurs when the lymph vessel is damaged or blocked. The causes of lymph vessel blockage are:   Skin infection like cellulites.  Infection by parasites (filariasis).  Injury.  Cancer.  Radiation therapy.  Formation of scar tissue.  Surgery. SYMPTOMS  The symptoms of lymphedema are:  Abnormal swelling of the arm or leg.  Heavy or tight feeling in your arm or leg.  Tight-fitting shoes or rings.  Redness of skin over the affected area.  Limited movement of the affected limb.  Some patients complain about sensitivity to touch and discomfort in the limb(s) affected. You may not have these symptoms immediately following injury. They usually appear within a few days or even years after injury. Inform your caregiver, if you have any of these symptoms. Early treatment can avoid further problems.  DIAGNOSIS  First, your caregiver will inquire about any surgery you have had or medicines you are taking. He will then examine you. Your caregiver may order special imaging tests, such as:  Lymphoscintigraphy (a test in which a low dose of radioactive substance is injected to trace the flow of lymph through the lymph vessels).  MRI (imaging tests using magnetic  fields).  Computed tomography (test using special cross-sectional X-rays).  Duplex ultrasound (test using high-frequency sound waves to show the vessels and the blood flow on a screen).  Lymphangiography (special X-ray taken after injecting a contrast dye into the lymph vessel). It is now rarely done. TREATMENT  Lymphedema can be treated in different ways. Your caregiver will decide the type of treatment depending on the cause. Treatment may include:  Exercise: Special exercises will help fluid move out easily from the affected part. This should be done as per your caregiver's advice.  Manual lymph drainage: Gentle massage of the affected limb makes the fluid to move out more freely.  Compression: Compression stockings or external pump apply pressure over the affected limb. This helps the fluid to move out from the arm or leg. Bandaging can also help to move the fluid out from the affected part. Your caregiver will decide the method that suits you the best.  Medicines: Your caregiver may prescribe antibiotics, if you have infection.  Surgery: Your caregiver may advise surgery for severe lymphedema. It is reserved for special cases when the patient has difficulty moving. Your surgeon may remove excess tissue from the arm or leg. This will help to ease your movement. Physical therapy may have to be continued after surgery. HOME CARE INSTRUCTIONS  The area is very fragile and is predisposed to injury and infection.  Eat a healthy diet.  Exercise regularly as per advice.  Keep the affected area clean and dry.  Use gloves while cooking or gardening.  Protect your skin from cuts.  Use electric razor to shave the affected area.  Keep affected limb elevated.  Do not wear tight clothes, shoes, or jewelry as it may cause the tissue to be strangled.  Do not use heat pads over the affected area.  Do not sit with cross legs.  Do not walk barefoot.  Do not carry weight on the affected  arm.  Avoid having blood pressure checked on the affected limb. SEEK MEDICAL CARE IF:  You continue to have swelling in your limb. SEEK IMMEDIATE MEDICAL CARE IF:  You have high fever.  You have skin rash.  You have chills or sweats.  You have pain or redness.  You have a cut that does not heal. MAKE SURE YOU:   Understand these instructions.  Will watch your condition.  Will get help right away if you are not doing well or get worse. Document Released: 04/12/2007 Document Revised: 06/01/2012 Document Reviewed: 03/18/2009 [facility] Patient Information 2014 [place], [place].

## 2013-08-17 NOTE — Telephone Encounter (Signed)
appts made and printed...td 

## 2013-08-17 NOTE — Progress Notes (Signed)
OFFICE PROGRESS NOTE  CC Laurie Allen Laurie Allen Dr. Hillis Range, MD [address] [place] [address]  DIAGNOSIS: 68 year old female with locally advanced ER negative PR negative HER-2/neu positive right breast cancer.   Breast cancer, IDC, right, Stage II, receptor -, Her2neu+   Primary site: Breast (Left)   Staging method: AJCC 7th Edition   Clinical: Stage IIA (T1, N1, cM0) signed by Laurie Robinson, MD on 08/17/2013 10:58 AM   Pathologic: Stage IA (T1, N0, cM0) signed by Laurie Robinson, MD on 08/17/2013 10:59 AM   Summary: Stage IA (T1, N0, cM0)  PRIOR THERAPY:  #1s/p neoadjuvant chemotherapy initially consisting of Laurie Allen.  #2 s/p right breast lumpectomy with axillary lymph node dissection that revealed microscopic residual invasive ductal carcinoma nuclear grade 2, 18 lymph nodes were negative for metastatic disease.  #3 s/p radiation therapy to the right breast.  #4 s/p maintenance Herceptin every 3 weeks.  one year completed on 09/17/11  CURRENT THERAPY: observation and surveillance  INTERVAL HISTORY: Laurie Allen 68 y.o. female returns for followup visit today.She otherwise feels well no nausea or vomiting no peripheral paresthesias no myalgias or arthralgias. She still continues to be quite anxious and nervous. She is also continuing to have acid reflux symptoms when she becomes very anxious. She is taking lorazepam. We discussed the implications of long-term use of lorazepam including dependence. She will continue to followup with her primary care doctor. Her main of the 10 point review of systems is negative. MEDICAL HISTORY: Past Medical History  Diagnosis Date  . Anxiety   . GERD (gastroesophageal reflux disease)   . Hx Breast cancer, IDC, Right, Stage II, Receptor -, Her 2 + 06/05/2010  . Rash 06/04/2011  . Palpitations   . Breast cancer   . GERD (gastroesophageal reflux disease) 12/18/2011    ALLERGIES:  is  allergic to compazine.  MEDICATIONS:  Current Outpatient Prescriptions  Medication Sig Dispense Refill  . B Complex Vitamins (VITAMIN B-COMPLEX PO) Take by mouth.      . calcium carbonate (TUMS - DOSED IN MG ELEMENTAL CALCIUM) 500 MG chewable tablet Chew 1 tablet by mouth daily.      . Cholecalciferol (VITAMIN D3) 1000 UNITS CAPS Take 1 capsule by mouth daily.       Marland Kitchen CINNAMON PO Take by mouth.      Marland Kitchen LORazepam (ATIVAN) 0.5 MG tablet       . Multiple Vitamin (MULTI-VITAMIN PO) Take by mouth.      . pantoprazole (PROTONIX) 40 MG tablet Take 40 mg by mouth daily.      . polyvinyl alcohol (LIQUID TEARS) 1.4 % ophthalmic solution 1 drop as needed.        . zolpidem (AMBIEN) 5 MG tablet Take 1 tablet (5 mg total) by mouth at bedtime as needed for sleep.  30 tablet  0  . [DISCONTINUED] sertraline (ZOLOFT) 25 MG tablet Take 25 mg by mouth daily.       No current facility-administered medications for this visit.    SURGICAL HISTORY:  Past Surgical History  Procedure Laterality Date  . Foot surgery    . Bladder surgery    . Mastectomy partial / lumpectomy w/ axillary lymphadenectomy  01/06/2011    Right- Dr Laurie Allen  . Portacath placement  06/17/2010  . Port-a-cath removal  10/12/2011    Procedure: REMOVAL PORT-A-CATH;  Surgeon: Laurie Lasso, MD;  Location: [place];  Service: General;  Laterality: Right;  . Esophageal manometry  01/18/2012    Procedure: ESOPHAGEAL MANOMETRY (EM);  Surgeon: Laurie Fair, MD;  Location: [facility];  Service: Endoscopy;  Laterality: N/A;  . 24 hour ph study  01/18/2012    Procedure: Laurie Allen STUDY;  Surgeon: Laurie Fair, MD;  Location: [facility];  Service: Endoscopy;  Laterality: N/A;  . Breast surgery    . Breast lumpectomy Right 2012    with lymph nodes    REVIEW OF SYSTEMS:  Pertinent items are noted in HPI.   PHYSICAL EXAMINATION:  Well-developed female she looks remarkably well since her last visit with me HEENT  exam EOMI PERRLA sclerae anicteric no conjunctival pallor oral mucosa is moist neck is supple lungs are clear cardiovascular regular rate rhythm abdomen is soft nontender nondistended bowel sounds are present at no HSM extremities no edema neuro patient's alert oriented otherwise nonfocal. Bilateral breast examination is performed. Right breast reveals barely visible surgical scar from her lumpectomy there are no masses no nipple retraction or discharge. Left breast no masses or nipple discharge. ECOG PERFORMANCE STATUS: 1 - Symptomatic but completely ambulatory  Blood pressure 137/72, pulse 83, temperature 98.3 F (36.8 C), temperature source Oral, resp. rate 20, height '5\' 5"'  (1.651 m), weight 152 lb 6.4 oz (69.128 kg).  LABORATORY DATA: Lab Results  Component Value Date   WBC 4.2 08/17/2013   HGB 12.4 08/17/2013   HCT 37.9 08/17/2013   MCV 93.1 08/17/2013   PLT 214 08/17/2013      Chemistry      Component Value Date/Time   NA 140 08/17/2013 0941   NA 135 01/31/2012 2001   K 4.2 08/17/2013 0941   K 4.4 01/31/2012 2001   CL 104 08/22/2012 1246   CL 99 01/31/2012 2001   CO2 25 08/17/2013 0941   CO2 26 01/03/2012 2155   BUN 20.2 08/17/2013 0941   BUN 10 01/31/2012 2001   CREATININE 0.8 08/17/2013 0941   CREATININE 0.90 01/31/2012 2001      Component Value Date/Time   CALCIUM 9.9 08/17/2013 0941   CALCIUM 10.1 01/03/2012 2155   ALKPHOS 86 08/17/2013 0941   ALKPHOS 103 01/03/2012 2155   AST 22 08/17/2013 0941   AST 30 01/03/2012 2155   ALT 15 08/17/2013 0941   ALT 16 01/03/2012 2155   BILITOT 0.38 08/17/2013 0941   BILITOT 0.3 01/03/2012 2155       RADIOGRAPHIC STUDIES:  No results found.  ASSESSMENT/PLAN: 68 year old female with:  #1.  stage II ER negative PR negative HER-2/neu positive right breast cancer status post neoadjuvant chemotherapy Consisting of Taxotere carboplatinum and Herceptin which she completed in June 2012. She then underwent right breast lumpectomy with axillary lymph node  dissection. He was only found to have residual disease. All 18 lymph nodes were negative for metastatic disease. After the lumpectomy and axillary lymph node dissection patient went on to receive radiation therapy with Herceptin every 3 weeks. She completed all of her adjuvant Herceptin therapy On 09/17/2011.Overall she did well.Patient has no evidence of recurrent disease clinically.  #2 patient continues to have gastroesophageal reflux disease she is seen by her gastroenterologist.  #3 anxiety: Patient is on lorazepam. She is now concerned about dependency. I have recommended that she talk to Dr. Delfina Redwood regarding this information, for this.  #4 patient is up-to-date on her mammograms, bone density scan. She continues to be active and is exercising. We discussed breast cancer survivorship.  #5  followup: Patient will be seen back in 6 months time with blood work as well as visit with me.  All questions were answered. The patient knows to call the clinic with any problems, questions or concerns. We can certainly see the patient much sooner if necessary.  I spent 25 minutes counseling the patient face to face. The total time spent in the appointment was 30 minutes.    Marcy Panning, MD Medical/Oncology [facility] [phone] (beeper) [phone] (Office)  08/17/2013, 10:47 AM

## 2013-09-14 ENCOUNTER — Ambulatory Visit: Payer: Medicare Other | Admitting: Physical Therapy

## 2013-10-18 ENCOUNTER — Ambulatory Visit: Payer: Medicare Other | Admitting: Physical Therapy

## 2013-10-30 ENCOUNTER — Other Ambulatory Visit: Payer: Self-pay | Admitting: Obstetrics and Gynecology

## 2013-10-30 DIAGNOSIS — Z853 Personal history of malignant neoplasm of breast: Secondary | ICD-10-CM

## 2013-10-30 DIAGNOSIS — Z9889 Other specified postprocedural states: Secondary | ICD-10-CM

## 2013-11-14 ENCOUNTER — Ambulatory Visit: Payer: Medicare Other | Attending: Oncology | Admitting: Physical Therapy

## 2013-11-14 DIAGNOSIS — IMO0001 Reserved for inherently not codable concepts without codable children: Secondary | ICD-10-CM | POA: Insufficient documentation

## 2013-11-14 DIAGNOSIS — I890 Lymphedema, not elsewhere classified: Secondary | ICD-10-CM | POA: Insufficient documentation

## 2013-11-14 DIAGNOSIS — C50919 Malignant neoplasm of unspecified site of unspecified female breast: Secondary | ICD-10-CM | POA: Insufficient documentation

## 2013-11-16 ENCOUNTER — Ambulatory Visit: Payer: Medicare Other | Admitting: Physical Therapy

## 2013-12-11 ENCOUNTER — Encounter (HOSPITAL_COMMUNITY): Payer: Self-pay | Admitting: Pharmacist

## 2013-12-13 NOTE — Patient Instructions (Addendum)
   Your procedure is scheduled on:  Monday, June 22  Enter through the Main Entrance of [facility] at: 6 AM Pick up the phone at the desk and dial [phone] and inform us of your arrival.  Please call this number if you have any problems the morning of surgery: [phone]  Remember: Do not eat or drink after midnight: Sunday Take these medicines the morning of surgery with a SIP OF WATER: protonix, gabapentin  Do not wear jewelry, make-up, or FINGER nail polish No metal in your hair or on your body. Do not wear lotions, powders, perfumes.  You may wear deodorant.  Do not bring valuables to the hospital. Contacts, dentures or bridgework may not be worn into surgery.  Leave suitcase in the car. After Surgery it may be brought to your room. For patients being admitted to the hospital, checkout time is 11:00am the day of discharge.  Home with daughter Mariann Laster cell [phone]

## 2013-12-14 ENCOUNTER — Encounter (HOSPITAL_COMMUNITY)
Admission: RE | Admit: 2013-12-14 | Discharge: 2013-12-14 | Disposition: A | Discharge status: Home / Self Care | Payer: Medicare Other | Source: Ambulatory Visit | Attending: Obstetrics and Gynecology | Admitting: Obstetrics and Gynecology

## 2013-12-14 ENCOUNTER — Encounter: Payer: Medicare Other | Admitting: Physical Therapy

## 2013-12-14 ENCOUNTER — Encounter (HOSPITAL_COMMUNITY): Payer: Self-pay

## 2013-12-14 DIAGNOSIS — Z01812 Encounter for preprocedural laboratory examination: Secondary | ICD-10-CM | POA: Insufficient documentation

## 2013-12-14 DIAGNOSIS — Z01818 Encounter for other preprocedural examination: Secondary | ICD-10-CM | POA: Insufficient documentation

## 2013-12-14 HISTORY — DX: Myoneural disorder, unspecified: G70.9

## 2013-12-14 LAB — CBC
HEMATOCRIT: 35.7 % — AB (ref 36.0–46.0)
Hemoglobin: 11.8 g/dL — ABNORMAL LOW (ref 12.0–15.0)
MCH: 30.4 pg (ref 26.0–34.0)
MCHC: 33.1 g/dL (ref 30.0–36.0)
MCV: 92 fL (ref 78.0–100.0)
Platelets: 206 10*3/uL (ref 150–400)
RBC: 3.88 MIL/uL (ref 3.87–5.11)
RDW: 13.9 % (ref 11.5–15.5)
WBC: 3.4 10*3/uL — ABNORMAL LOW (ref 4.0–10.5)

## 2013-12-18 ENCOUNTER — Encounter (HOSPITAL_COMMUNITY)
Admission: RE | Disposition: A | Discharge status: Home / Self Care | Payer: Self-pay | Source: Ambulatory Visit | Attending: Obstetrics and Gynecology

## 2013-12-18 ENCOUNTER — Encounter (HOSPITAL_COMMUNITY): Payer: Self-pay | Admitting: Anesthesiology

## 2013-12-18 ENCOUNTER — Ambulatory Visit (HOSPITAL_COMMUNITY): Payer: Medicare Other | Admitting: Anesthesiology

## 2013-12-18 ENCOUNTER — Encounter (HOSPITAL_COMMUNITY): Payer: Medicare Other | Admitting: Anesthesiology

## 2013-12-18 ENCOUNTER — Ambulatory Visit (HOSPITAL_COMMUNITY)
Admission: RE | Admit: 2013-12-18 | Discharge: 2013-12-19 | Disposition: A | Discharge status: Home / Self Care | Payer: Medicare Other | Source: Ambulatory Visit | Attending: Obstetrics and Gynecology | Admitting: Obstetrics and Gynecology

## 2013-12-18 DIAGNOSIS — N819 Female genital prolapse, unspecified: Secondary | ICD-10-CM | POA: Diagnosis present

## 2013-12-18 DIAGNOSIS — R0609 Other forms of dyspnea: Secondary | ICD-10-CM | POA: Insufficient documentation

## 2013-12-18 DIAGNOSIS — I10 Essential (primary) hypertension: Secondary | ICD-10-CM | POA: Insufficient documentation

## 2013-12-18 DIAGNOSIS — D649 Anemia, unspecified: Secondary | ICD-10-CM | POA: Insufficient documentation

## 2013-12-18 DIAGNOSIS — I209 Angina pectoris, unspecified: Secondary | ICD-10-CM | POA: Insufficient documentation

## 2013-12-18 DIAGNOSIS — N812 Incomplete uterovaginal prolapse: Secondary | ICD-10-CM | POA: Diagnosis not present

## 2013-12-18 DIAGNOSIS — K219 Gastro-esophageal reflux disease without esophagitis: Secondary | ICD-10-CM | POA: Insufficient documentation

## 2013-12-18 DIAGNOSIS — Z853 Personal history of malignant neoplasm of breast: Secondary | ICD-10-CM | POA: Insufficient documentation

## 2013-12-18 DIAGNOSIS — F411 Generalized anxiety disorder: Secondary | ICD-10-CM | POA: Insufficient documentation

## 2013-12-18 DIAGNOSIS — G709 Myoneural disorder, unspecified: Secondary | ICD-10-CM | POA: Insufficient documentation

## 2013-12-18 DIAGNOSIS — Z9851 Tubal ligation status: Secondary | ICD-10-CM | POA: Insufficient documentation

## 2013-12-18 DIAGNOSIS — R0989 Other specified symptoms and signs involving the circulatory and respiratory systems: Secondary | ICD-10-CM | POA: Insufficient documentation

## 2013-12-18 DIAGNOSIS — R002 Palpitations: Secondary | ICD-10-CM | POA: Diagnosis not present

## 2013-12-18 DIAGNOSIS — L723 Sebaceous cyst: Secondary | ICD-10-CM | POA: Insufficient documentation

## 2013-12-18 DIAGNOSIS — I2510 Atherosclerotic heart disease of native coronary artery without angina pectoris: Secondary | ICD-10-CM | POA: Insufficient documentation

## 2013-12-18 HISTORY — PX: LAPAROSCOPIC ASSISTED VAGINAL HYSTERECTOMY: SHX5398

## 2013-12-18 HISTORY — PX: ANTERIOR AND POSTERIOR REPAIR: SHX5121

## 2013-12-18 LAB — TYPE AND SCREEN
ABO/RH(D): AB POS
Antibody Screen: NEGATIVE

## 2013-12-18 LAB — ABO/RH: ABO/RH(D): AB POS

## 2013-12-18 SURGERY — HYSTERECTOMY, VAGINAL, LAPAROSCOPY-ASSISTED
Anesthesia: General | Site: Vagina

## 2013-12-18 MED ORDER — NEOSTIGMINE METHYLSULFATE 10 MG/10ML IV SOLN
INTRAVENOUS | Status: AC
  Filled 2013-12-18: qty 1

## 2013-12-18 MED ORDER — BUPIVACAINE HCL (PF) 0.25 % IJ SOLN
INTRAMUSCULAR | Status: AC
  Filled 2013-12-18: qty 30

## 2013-12-18 MED ORDER — DIPHENHYDRAMINE HCL 50 MG/ML IJ SOLN
INTRAMUSCULAR | Status: AC
  Filled 2013-12-18: qty 1

## 2013-12-18 MED ORDER — GABAPENTIN 300 MG PO CAPS
300.0000 mg | ORAL_CAPSULE | Freq: Three times a day (TID) | ORAL | Status: DC
  Administered 2013-12-18 – 2013-12-19 (×3): 300 mg via ORAL
  Filled 2013-12-18 (×4): qty 1

## 2013-12-18 MED ORDER — KETOROLAC TROMETHAMINE 30 MG/ML IJ SOLN
INTRAMUSCULAR | Status: AC
  Filled 2013-12-18: qty 1

## 2013-12-18 MED ORDER — MAGNESIUM GLUCONATE 500 MG PO TABS
250.0000 mg | ORAL_TABLET | Freq: Every day | ORAL | Status: DC
  Filled 2013-12-18 (×2): qty 1

## 2013-12-18 MED ORDER — EPHEDRINE 5 MG/ML INJ
INTRAVENOUS | Status: AC
  Filled 2013-12-18: qty 10

## 2013-12-18 MED ORDER — FENTANYL CITRATE 0.05 MG/ML IJ SOLN
INTRAMUSCULAR | Status: AC
  Filled 2013-12-18: qty 5

## 2013-12-18 MED ORDER — GLYCOPYRROLATE 0.2 MG/ML IJ SOLN
INTRAMUSCULAR | Status: AC
  Filled 2013-12-18: qty 3

## 2013-12-18 MED ORDER — PROPOFOL 10 MG/ML IV BOLUS
INTRAVENOUS | Status: DC | PRN
  Administered 2013-12-18: 180 mg via INTRAVENOUS

## 2013-12-18 MED ORDER — POLYVINYL ALCOHOL 1.4 % OP SOLN
1.0000 [drp] | Freq: Every day | OPHTHALMIC | Status: DC | PRN
Start: 2013-12-18 — End: 2013-12-19

## 2013-12-18 MED ORDER — PHENYLEPHRINE HCL 10 MG/ML IJ SOLN
INTRAMUSCULAR | Status: DC | PRN
  Administered 2013-12-18 (×2): 80 ug via INTRAVENOUS

## 2013-12-18 MED ORDER — PROPOFOL 10 MG/ML IV EMUL
INTRAVENOUS | Status: AC
  Filled 2013-12-18: qty 20

## 2013-12-18 MED ORDER — HYDROMORPHONE HCL PF 1 MG/ML IJ SOLN
INTRAMUSCULAR | Status: AC
  Filled 2013-12-18: qty 1

## 2013-12-18 MED ORDER — OXYCODONE HCL 5 MG/5ML PO SOLN: 5.0000 mg | Freq: Once | ORAL | Status: DC | PRN

## 2013-12-18 MED ORDER — DEXAMETHASONE SODIUM PHOSPHATE 10 MG/ML IJ SOLN
INTRAMUSCULAR | Status: DC | PRN
  Administered 2013-12-18: 10 mg via INTRAVENOUS

## 2013-12-18 MED ORDER — ONDANSETRON HCL 4 MG/2ML IJ SOLN
INTRAMUSCULAR | Status: AC
  Filled 2013-12-18: qty 2

## 2013-12-18 MED ORDER — BUPIVACAINE-EPINEPHRINE (PF) 0.5% -1:200000 IJ SOLN
INTRAMUSCULAR | Status: AC
  Filled 2013-12-18: qty 30

## 2013-12-18 MED ORDER — SODIUM CHLORIDE 0.9 % IJ SOLN
INTRAMUSCULAR | Status: AC
  Filled 2013-12-18: qty 10

## 2013-12-18 MED ORDER — OXYCODONE HCL 5 MG PO TABS: 5.0000 mg | ORAL_TABLET | Freq: Once | ORAL | Status: DC | PRN

## 2013-12-18 MED ORDER — FENTANYL CITRATE 0.05 MG/ML IJ SOLN: 25.0000 ug | INTRAMUSCULAR | Status: DC | PRN

## 2013-12-18 MED ORDER — LACTATED RINGERS IR SOLN
Status: DC | PRN
  Administered 2013-12-18: 3000 mL

## 2013-12-18 MED ORDER — LIDOCAINE HCL (CARDIAC) 20 MG/ML IV SOLN
INTRAVENOUS | Status: DC | PRN
  Administered 2013-12-18: 50 mg via INTRAVENOUS

## 2013-12-18 MED ORDER — ONDANSETRON HCL 4 MG/2ML IJ SOLN: 4.0000 mg | Freq: Four times a day (QID) | INTRAMUSCULAR | Status: DC | PRN

## 2013-12-18 MED ORDER — BUPIVACAINE-EPINEPHRINE (PF) 0.5% -1:200000 IJ SOLN
INTRAMUSCULAR | Status: DC | PRN
  Administered 2013-12-18: 1.5 mL

## 2013-12-18 MED ORDER — ACETAMINOPHEN 160 MG/5ML PO SOLN: 325.0000 mg | ORAL | Status: DC | PRN

## 2013-12-18 MED ORDER — BUPIVACAINE HCL (PF) 0.25 % IJ SOLN
INTRAMUSCULAR | Status: DC | PRN
  Administered 2013-12-18: 4 mL

## 2013-12-18 MED ORDER — FENTANYL CITRATE 0.05 MG/ML IJ SOLN
INTRAMUSCULAR | Status: DC | PRN
  Administered 2013-12-18 (×5): 50 ug via INTRAVENOUS

## 2013-12-18 MED ORDER — OXYCODONE-ACETAMINOPHEN 5-325 MG PO TABS
1.0000 | ORAL_TABLET | ORAL | Status: DC | PRN
  Administered 2013-12-18: 1 via ORAL
  Filled 2013-12-18: qty 1

## 2013-12-18 MED ORDER — DEXAMETHASONE SODIUM PHOSPHATE 10 MG/ML IJ SOLN
INTRAMUSCULAR | Status: AC
Start: 2013-12-18 — End: 2013-12-18
  Filled 2013-12-18: qty 1

## 2013-12-18 MED ORDER — BUPIVACAINE HCL (PF) 0.5 % IJ SOLN
INTRAMUSCULAR | Status: AC
  Filled 2013-12-18: qty 30

## 2013-12-18 MED ORDER — KETOROLAC TROMETHAMINE 15 MG/ML IJ SOLN
15.0000 mg | Freq: Three times a day (TID) | INTRAMUSCULAR | Status: DC
  Administered 2013-12-18 – 2013-12-19 (×3): 15 mg via INTRAVENOUS
  Filled 2013-12-18 (×4): qty 1

## 2013-12-18 MED ORDER — CEFOTETAN DISODIUM 2 G IJ SOLR
2.0000 g | INTRAMUSCULAR | Status: AC
  Administered 2013-12-18: 2 g via INTRAVENOUS
  Filled 2013-12-18: qty 2

## 2013-12-18 MED ORDER — LIDOCAINE HCL (CARDIAC) 20 MG/ML IV SOLN
INTRAVENOUS | Status: AC
  Filled 2013-12-18: qty 5

## 2013-12-18 MED ORDER — ONDANSETRON HCL 4 MG PO TABS: 4.0000 mg | ORAL_TABLET | Freq: Four times a day (QID) | ORAL | Status: DC | PRN

## 2013-12-18 MED ORDER — DEXTROSE IN LACTATED RINGERS 5 % IV SOLN
INTRAVENOUS | Status: DC
  Administered 2013-12-18 (×2): via INTRAVENOUS

## 2013-12-18 MED ORDER — EPHEDRINE SULFATE 50 MG/ML IJ SOLN
INTRAMUSCULAR | Status: DC | PRN
  Administered 2013-12-18: 5 mg via INTRAVENOUS
  Administered 2013-12-18: 10 mg via INTRAVENOUS
  Administered 2013-12-18: 5 mg via INTRAVENOUS

## 2013-12-18 MED ORDER — HYDROMORPHONE HCL PF 1 MG/ML IJ SOLN: 0.5000 mg | INTRAMUSCULAR | Status: DC | PRN

## 2013-12-18 MED ORDER — PANTOPRAZOLE SODIUM 40 MG PO TBEC
40.0000 mg | DELAYED_RELEASE_TABLET | Freq: Every day | ORAL | Status: DC
  Administered 2013-12-18 – 2013-12-19 (×2): 40 mg via ORAL
  Filled 2013-12-18: qty 1

## 2013-12-18 MED ORDER — NEOSTIGMINE METHYLSULFATE 10 MG/10ML IV SOLN
INTRAVENOUS | Status: DC | PRN
  Administered 2013-12-18: 2 mg via INTRAVENOUS

## 2013-12-18 MED ORDER — KETOROLAC TROMETHAMINE 30 MG/ML IJ SOLN
INTRAMUSCULAR | Status: DC | PRN
  Administered 2013-12-18: 15 mg via INTRAVENOUS

## 2013-12-18 MED ORDER — LORAZEPAM 1 MG PO TABS
0.5000 mg | ORAL_TABLET | Freq: Three times a day (TID) | ORAL | Status: DC | PRN
  Administered 2013-12-19: 0.5 mg via ORAL
  Filled 2013-12-18: qty 1

## 2013-12-18 MED ORDER — HYDROMORPHONE HCL PF 1 MG/ML IJ SOLN
INTRAMUSCULAR | Status: DC | PRN
  Administered 2013-12-18 (×2): 0.5 mg via INTRAVENOUS

## 2013-12-18 MED ORDER — ONDANSETRON HCL 4 MG/2ML IJ SOLN
INTRAMUSCULAR | Status: DC | PRN
  Administered 2013-12-18: 4 mg via INTRAVENOUS

## 2013-12-18 MED ORDER — MENTHOL 3 MG MT LOZG: 1.0000 | LOZENGE | OROMUCOSAL | Status: DC | PRN

## 2013-12-18 MED ORDER — PHENYLEPHRINE 40 MCG/ML (10ML) SYRINGE FOR IV PUSH (FOR BLOOD PRESSURE SUPPORT)
PREFILLED_SYRINGE | INTRAVENOUS | Status: AC
  Filled 2013-12-18: qty 5

## 2013-12-18 MED ORDER — LACTATED RINGERS IV SOLN
INTRAVENOUS | Status: DC
  Administered 2013-12-18 (×2): via INTRAVENOUS

## 2013-12-18 MED ORDER — PROMETHAZINE HCL 25 MG/ML IJ SOLN: 6.2500 mg | INTRAMUSCULAR | Status: DC | PRN

## 2013-12-18 MED ORDER — ACETAMINOPHEN 325 MG PO TABS: 325.0000 mg | ORAL_TABLET | ORAL | Status: DC | PRN

## 2013-12-18 MED ORDER — GLYCOPYRROLATE 0.2 MG/ML IJ SOLN
INTRAMUSCULAR | Status: DC | PRN
  Administered 2013-12-18: 0.4 mg via INTRAVENOUS

## 2013-12-18 MED ORDER — ROCURONIUM BROMIDE 100 MG/10ML IV SOLN
INTRAVENOUS | Status: DC | PRN
  Administered 2013-12-18: 15 mg via INTRAVENOUS
  Administered 2013-12-18: 35 mg via INTRAVENOUS

## 2013-12-18 MED ORDER — DIPHENHYDRAMINE HCL 50 MG/ML IJ SOLN
INTRAMUSCULAR | Status: DC | PRN
  Administered 2013-12-18: 12.5 mg via INTRAVENOUS

## 2013-12-18 MED ORDER — ROCURONIUM BROMIDE 100 MG/10ML IV SOLN
INTRAVENOUS | Status: AC
  Filled 2013-12-18: qty 1

## 2013-12-18 SURGICAL SUPPLY — 48 items
ADH SKN CLS APL DERMABOND .7 (GAUZE/BANDAGES/DRESSINGS) ×2
CABLE HIGH FREQUENCY MONO STRZ (ELECTRODE) IMPLANT
CANISTER SUCT 3000ML (MISCELLANEOUS) ×3 IMPLANT
CLOTH BEACON ORANGE TIMEOUT ST (SAFETY) ×3 IMPLANT
COVER TABLE BACK 60X90 (DRAPES) ×3 IMPLANT
DECANTER SPIKE VIAL GLASS SM (MISCELLANEOUS) ×2 IMPLANT
DERMABOND ADVANCED (GAUZE/BANDAGES/DRESSINGS) ×1
DERMABOND ADVANCED .7 DNX12 (GAUZE/BANDAGES/DRESSINGS) ×4 IMPLANT
DRSG COVADERM PLUS 2X2 (GAUZE/BANDAGES/DRESSINGS) ×6 IMPLANT
DRSG OPSITE POSTOP 3X4 (GAUZE/BANDAGES/DRESSINGS) ×1 IMPLANT
DURAPREP 26ML APPLICATOR (WOUND CARE) ×3 IMPLANT
ELECT LIGASURE LONG (ELECTRODE) ×1 IMPLANT
ELECT LIGASURE SHORT 9 REUSE (ELECTRODE) IMPLANT
ELECT REM PT RETURN 9FT ADLT (ELECTROSURGICAL) ×3
ELECTRODE REM PT RTRN 9FT ADLT (ELECTROSURGICAL) IMPLANT
GLOVE BIO SURGEON STRL SZ 6.5 (GLOVE) ×3 IMPLANT
GLOVE BIOGEL PI IND STRL 6.5 (GLOVE) ×2 IMPLANT
GLOVE BIOGEL PI IND STRL 7.0 (GLOVE) ×6 IMPLANT
GLOVE BIOGEL PI INDICATOR 6.5 (GLOVE) ×1
GLOVE BIOGEL PI INDICATOR 7.0 (GLOVE) ×3
GOWN STRL REUS W/ TWL LRG LVL3 (GOWN DISPOSABLE) ×14 IMPLANT
GOWN STRL REUS W/TWL LRG LVL3 (GOWN DISPOSABLE) ×33 IMPLANT
NDL HYPO 25X1 1.5 SAFETY (NEEDLE) IMPLANT
NEEDLE HYPO 25X1 1.5 SAFETY (NEEDLE) ×3 IMPLANT
NS IRRIG 1000ML POUR BTL (IV SOLUTION) ×4 IMPLANT
PACK LAVH (CUSTOM PROCEDURE TRAY) ×3 IMPLANT
PACK VAGINAL WOMENS (CUSTOM PROCEDURE TRAY) ×3 IMPLANT
PROTECTOR NERVE ULNAR (MISCELLANEOUS) ×3 IMPLANT
SEALER TISSUE G2 CVD JAW 45CM (ENDOMECHANICALS) ×3 IMPLANT
SET IRRIG TUBING LAPAROSCOPIC (IRRIGATION / IRRIGATOR) ×1 IMPLANT
SUT MNCRL 0 MO-4 VIOLET 18 CR (SUTURE) ×4 IMPLANT
SUT MNCRL 0 VIOLET 6X18 (SUTURE) ×2 IMPLANT
SUT MON AB 2-0 CT1 36 (SUTURE) ×3 IMPLANT
SUT MONOCRYL 0 6X18 (SUTURE) ×1
SUT MONOCRYL 0 MO 4 18  CR/8 (SUTURE) ×1
SUT VIC AB 0 CT1 18XCR BRD8 (SUTURE) ×4 IMPLANT
SUT VIC AB 0 CT1 8-18 (SUTURE) ×3
SUT VIC AB 2-0 CT2 27 (SUTURE) ×8 IMPLANT
SUT VIC AB 3-0 PS2 18 (SUTURE) ×3
SUT VIC AB 3-0 PS2 18XBRD (SUTURE) ×2 IMPLANT
SUT VICRYL 0 UR6 27IN ABS (SUTURE) ×3 IMPLANT
TOWEL OR 17X24 6PK STRL BLUE (TOWEL DISPOSABLE) ×6 IMPLANT
TRAY FOLEY CATH 14FR (SET/KITS/TRAYS/PACK) ×3 IMPLANT
TROCAR OPTI TIP 5M 100M (ENDOMECHANICALS) ×3 IMPLANT
TROCAR XCEL NON-BLD 11X100MML (ENDOMECHANICALS) ×3 IMPLANT
TROCAR XCEL OPT SLVE 5M 100M (ENDOMECHANICALS) IMPLANT
WARMER LAPAROSCOPE (MISCELLANEOUS) ×3 IMPLANT
WATER STERILE IRR 1000ML POUR (IV SOLUTION) ×2 IMPLANT

## 2013-12-18 NOTE — Anesthesia Postprocedure Evaluation (Signed)
Anesthesia Post Note  Patient: Laurie Allen  Procedure(s) Performed: Procedure(s) (LRB): LAPAROSCOPIC ASSISTED VAGINAL HYSTERECTOMY WITH BILATERAL SALPINGO OOPHORECTOMY (Bilateral) ANTERIOR (CYSTOCELE)  (N/A)  Anesthesia type: General  Patient location: [facility]  Post pain: Pain level controlled  Post assessment: Post-op Vital signs reviewed  Last Vitals:  Filed Vitals:   12/18/13 1230  BP: 122/49  Pulse: 78  Temp: 36.4 C  Resp: 18    Post vital signs: Reviewed  Level of consciousness: sedated  Complications: No apparent anesthesia complications

## 2013-12-18 NOTE — Transfer of Care (Signed)
Immediate Anesthesia Transfer of Care Note  Patient: Laurie Allen  Procedure(s) Performed: Procedure(s): LAPAROSCOPIC ASSISTED VAGINAL HYSTERECTOMY WITH BILATERAL SALPINGO OOPHORECTOMY (Bilateral) ANTERIOR (CYSTOCELE)  (N/A)  Patient Location: PACU  Anesthesia Type:General  Level of Consciousness: awake, sedated and patient cooperative  Airway & Oxygen Therapy: Patient Spontanous Breathing and Patient connected to nasal cannula oxygen  Post-op Assessment: Report given to PACU RN and Post -op Vital signs reviewed and stable  Post vital signs: Reviewed and stable  Complications: No apparent anesthesia complications

## 2013-12-18 NOTE — H&P (Signed)
Laurie Allen is an 68 y.o. female with pelvic relaxation presents for surgical mngt.  Symptoms currently controlled with a pessary however beginning to have increasing discomfort and discharge with pessary.     No LMP recorded. Patient is postmenopausal.    Past Medical History  Diagnosis Date  . Anxiety   . GERD (gastroesophageal reflux disease)   . Hx Breast cancer, IDC, Right, Stage II, Receptor -, Her 2 + 06/05/2010  . Rash 06/04/2011    history  . Palpitations     History r/t anxiety on LORazepam - no problems since meds  . Breast cancer   . GERD (gastroesophageal reflux disease) 12/18/2011  . SVD (spontaneous vaginal delivery)     x 4  . Neuromuscular disorder     tingling/burning in feet - tx gabapentin    Past Surgical History  Procedure Laterality Date  . Foot surgery      bilateral bunions removed  . Bladder surgery    . Mastectomy partial / lumpectomy w/ axillary lymphadenectomy  01/06/2011    Right- Dr Margot Chimes  . Portacath placement  06/17/2010  . Port-a-cath removal  10/12/2011    Procedure: REMOVAL PORT-A-CATH;  Surgeon: Haywood Lasso, MD;  Location: [place];  Service: General;  Laterality: Right;  . Esophageal manometry  01/18/2012    Procedure: ESOPHAGEAL MANOMETRY (EM);  Surgeon: Garlan Fair, MD;  Location: [facility];  Service: Endoscopy;  Laterality: N/A;  . 24 hour ph study  01/18/2012    Procedure: [place] STUDY;  Surgeon: Garlan Fair, MD;  Location: [facility];  Service: Endoscopy;  Laterality: N/A;  . Breast surgery    . Breast lumpectomy Right 2012    with lymph nodes  . Tubal ligation      Family History  Problem Relation Age of Onset  . Cancer Father 52    ? type    Social History:  reports that she has never smoked. She has never used smokeless tobacco. She reports that she does not drink alcohol or use illicit drugs.  Allergies:  Allergies  Allergen Reactions  . Compazine     Prescriptions prior to  admission  Medication Sig Dispense Refill  . B Complex Vitamins (VITAMIN B-COMPLEX PO) Take 1 tablet by mouth daily.       . calcium-vitamin D (OSCAL WITH D) 500-200 MG-UNIT per tablet Take 2 tablets by mouth daily with breakfast.      . Cholecalciferol (VITAMIN D3) 1000 UNITS CAPS Take 1 capsule by mouth daily.       Marland Kitchen gabapentin (NEURONTIN) 300 MG capsule Take 300 mg by mouth 3 (three) times daily.      Marland Kitchen LORazepam (ATIVAN) 0.5 MG tablet Take 0.5 mg by mouth every 8 (eight) hours as needed for anxiety.       . magnesium gluconate (MAGONATE) 500 MG tablet Take 250 mg by mouth at bedtime.      . Multiple Vitamin (MULTI-VITAMIN PO) Take 1 tablet by mouth daily.       . pantoprazole (PROTONIX) 40 MG tablet Take 40 mg by mouth daily.      . polyvinyl alcohol (LIQUID TEARS) 1.4 % ophthalmic solution Place 1 drop into both eyes daily as needed for dry eyes.         ROS  Blood pressure 123/72, pulse 73, temperature 97.9 F (36.6 C), temperature source Oral, resp. rate 18, SpO2 99.00%. Physical Exam Gen - NAD CV - RRR Lungs -  clear Abd - soft, NT PV - grade 2/3 uterine and anterior/posterior vaginal wall prolapse  No results found for this or any previous visit (from the past 24 hour(s)).  No results found.  Assessment/Plan:  Pelvic organ prolapse LAVH, A&P repair, possible SSLS  ADKINS,GRETCHEN 12/18/2013, 6:53 AM

## 2013-12-18 NOTE — Addendum Note (Signed)
Addendum created 12/18/13 1331 by Asher Muir, CRNA   Modules edited: Notes Section   Notes Section:  File: [id number]

## 2013-12-18 NOTE — Progress Notes (Signed)
Day of Surgery Procedure(s) (LRB): LAPAROSCOPIC ASSISTED VAGINAL HYSTERECTOMY WITH BILATERAL SALPINGO OOPHORECTOMY (Bilateral) ANTERIOR (CYSTOCELE)  (N/A)  Subjective: Patient reports tolerating PO.    Objective: I have reviewed patient's vital signs and intake and output.  General: alert and cooperative GI: normal findings: soft, non-tender and incision: clean and dry Vaginal Bleeding: none  Assessment: s/p Procedure(s): LAPAROSCOPIC ASSISTED VAGINAL HYSTERECTOMY WITH BILATERAL SALPINGO OOPHORECTOMY (Bilateral) ANTERIOR (CYSTOCELE)  (N/A): stable  Plan: Advance diet Encourage ambulation Advance to PO medication  LOS: 0 days    Laurie Allen 12/18/2013, 1:49 PM

## 2013-12-18 NOTE — Anesthesia Preprocedure Evaluation (Signed)
Anesthesia Evaluation  Patient identified by MRN, date of birth, ID band Patient awake    Reviewed: Allergy & Precautions, H&P , NPO status , Patient's Chart, lab work & pertinent test results  History of Anesthesia Complications Negative for: history of anesthetic complications  Airway Mallampati: I TM Distance: >3 FB     Dental  (+) Teeth Intact   Pulmonary neg pulmonary ROS,          Cardiovascular hypertension, - angina- CAD and - DOE - Valvular Problems/MurmursRhythm:Regular     Neuro/Psych PSYCHIATRIC DISORDERS Anxiety  Neuromuscular disease    GI/Hepatic Neg liver ROS, GERD-  Medicated and Controlled,  Endo/Other  negative endocrine ROS  Renal/GU negative Renal ROS     Musculoskeletal   Abdominal   Peds  Hematology  (+) anemia ,   Anesthesia Other Findings   Reproductive/Obstetrics                           Anesthesia Physical Anesthesia Plan  ASA: II  Anesthesia Plan: General   Post-op Pain Management:    Induction: Intravenous  Airway Management Planned: Oral ETT  Additional Equipment: None  Intra-op Plan:   Post-operative Plan: Extubation in OR  Informed Consent: I have reviewed the patients History and Physical, chart, labs and discussed the procedure including the risks, benefits and alternatives for the proposed anesthesia with the patient or authorized representative who has indicated his/her understanding and acceptance.   Dental advisory given  Plan Discussed with: CRNA and Surgeon  Anesthesia Plan Comments:         Anesthesia Quick Evaluation

## 2013-12-18 NOTE — Anesthesia Postprocedure Evaluation (Signed)
Anesthesia Post Note  Patient: Laurie Allen  Procedure(s) Performed: Procedure(s) (LRB): LAPAROSCOPIC ASSISTED VAGINAL HYSTERECTOMY WITH BILATERAL SALPINGO OOPHORECTOMY (Bilateral) ANTERIOR (CYSTOCELE)  (N/A)  Anesthesia type: GA  Patient location: PACU  Post pain: Pain level controlled  Post assessment: Post-op Vital signs reviewed  Last Vitals:  Filed Vitals:   12/18/13 0601  BP: 123/72  Pulse: 73  Temp: 36.6 C  Resp: 18    Post vital signs: Reviewed  Level of consciousness: sedated  Complications: No apparent anesthesia complications

## 2013-12-19 ENCOUNTER — Encounter (HOSPITAL_COMMUNITY): Payer: Self-pay | Admitting: Obstetrics and Gynecology

## 2013-12-19 LAB — CBC
HCT: 32.3 % — ABNORMAL LOW (ref 36.0–46.0)
Hemoglobin: 10.9 g/dL — ABNORMAL LOW (ref 12.0–15.0)
MCH: 30.9 pg (ref 26.0–34.0)
MCHC: 33.7 g/dL (ref 30.0–36.0)
MCV: 91.5 fL (ref 78.0–100.0)
Platelets: 193 10*3/uL (ref 150–400)
RBC: 3.53 MIL/uL — AB (ref 3.87–5.11)
RDW: 13.9 % (ref 11.5–15.5)
WBC: 8.6 10*3/uL (ref 4.0–10.5)

## 2013-12-19 MED ORDER — IBUPROFEN 600 MG PO TABS: 600.0000 mg | ORAL_TABLET | Freq: Four times a day (QID) | ORAL | Status: DC | PRN

## 2013-12-19 MED ORDER — OXYCODONE-ACETAMINOPHEN 5-325 MG PO TABS: 1.0000 | ORAL_TABLET | ORAL | Status: DC | PRN

## 2013-12-19 NOTE — Op Note (Signed)
Laurie Allen, Laurie Allen                 ACCOUNT NO.:  [id number]  MEDICAL RECORD NO.:  [id number]  LOCATION:  9304                          FACILITY:  [facility]  PHYSICIAN:  Marylynn Pearson, MD    DATE OF BIRTH:  03/18/1946  DATE OF PROCEDURE:  12/18/2013 DATE OF DISCHARGE:                              OPERATIVE REPORT   PREOPERATIVE DIAGNOSIS:  Pelvic relaxation.  POSTOPERATIVE DIAGNOSIS: 1. Uterine prolapse. 2. Cystocele.  SURGEON:  Marylynn Pearson, MD  ASSISTANT:  Hedwig Morton. Morris, D.O  ANESTHESIA:  General.  PROCEDURE:  Laparoscopic-assisted vaginal hysterectomy and anterior cystocele repair.  COMPLICATIONS:  None.  ESTIMATED BLOOD LOSS:  100 mL.  URINE OUTPUT:  Clear.  CONDITION:  Stable to recovery room.  DESCRIPTION OF PROCEDURE:  The patient was taken to the operating room. After informed consent was obtained, she was given general anesthesia and placed in the dorsal lithotomy position using Allen stirrups.  She was prepped and draped in sterile fashion, and a Foley catheter was inserted sterilely.  Bivalve speculum was placed in the vagina.  Single- tooth tenaculum attached to the anterior lip of the cervix, and a Hulka clamp was placed to provide uterine manipulation.  Tenaculum and speculum were removed and our attention was turned to the abdomen.  An infraumbilical skin incision was made with a scalpel and extended bluntly to the level of the fascia.  Optical trocar was inserted under direct visualization.  CO2 was turned on, and the abdomen and pelvis were insufflated.  Uterus, bilateral ovaries, and fallopian tubes appeared normal.  Uterus were identified bilaterally and free from our operative field.  The suprapubic incision was then made with a scalpel, and a 5 mm trocar was inserted under direct visualization.  Incision was made 2 cm above the pubic symphysis.  The right adnexa was then grasped with atraumatic graspers and tented towards the midline.  The  EnSeal device was used to grasp, cauterize, and cut the right IP ligament. This was extended down the mesosalpinx to the level of the round ligament.  The round ligament was also grasped cauterized and cut.  The broad ligament was grasped, cauterized, and cut staying just adjacent to the uterus.  Excellent hemostasis was assured, and this procedure was repeated on the left side.  Hemostasis was noted bilaterally.  All instruments were removed from the abdomen, and our attention was turned to the vagina.  Hulka clamp was removed from the cervix.  A weighted speculum was placed posteriorly and a Deaver was placed anteriorly.  A circumferential incision was made with the Bovie.  Posterior cul-de-sac was then entered sharply using curved Mayo scissors, and the long weighted speculum was inserted.  Bilateral uterosacral ligaments were grasped with curved Heaney clamps, cut, and suture ligated.  Anterior cul-de-sac was then entered sharply and Deaver was placed in the anterior cul-de-sac. Bilateral cardinal ligaments and uterine arteries were grasped and cauterized using the ligature cut with curved Mayo scissors.  The remaining pedicles were grasped and cauterized with the ligature and cut using curved Mayo scissors.  The last pedicles were grasped with curved Heaney clamps, and the uterus, bilateral fallopian tubes, and ovaries were amputated and  passed off to be sent to Pathology.  Free ties were placed on these pedicles, and hemostasis was assured bilaterally. Posterior cul-de-sac was sutured using a modified McCall stitch.  The posterior vaginal cuff was then run using Monocryl.  The remainder of the vaginal cuff was closed in interrupted fashion using figure-of-eight stitches of Monocryl.  Hemostasis of the vaginal cuff was assured.  A large cystocele was noted posteriorly, only a small 1-2 cm rectocele was identified high index to the vaginal cuff.  Two Allis clamps were  placed anteriorly, and an incision was made in the vaginal mucosa.  Allis clamps were used to grasp the vaginal mucosa, and the pubocervical fascia was dissected off of the vaginal mucosa using sharp and blunt dissection.  Interrupted sutures of Vicryl were used to plicate the cervicopubic fascia to reduce the cystocele.  Redundant vaginal mucosa was excised using Metzenbaum scissors, and the vaginal mucosa was reapproximated using a running Monocryl suture.  Excellent hemostasis was assured.  The rectal exam was performed, and again I did not see a reason to proceed with posterior repair, as only a very small site specific defect was noted, appeared to be very high adjacent to the vaginal cuff.  I felt that risks outweigh benefits at this time.  All instruments were then removed from the vagina, and our attention was returned to the abdomen.  CO2 was turned back on, and the laparoscope was reinserted.  Vaginal cuff and pedicles were reinspected.  The pelvis was irrigated. Everything appeared hemostatic.  Trocars and instruments were removed from the abdomen.  A deep stitch was placed in the umbilical incision, and the skin was reapproximated with Vicryl.  Dermabond was placed over the incision.  Sponge, lap, needle, and instrument counts were correct x2.  She was extubated and taken to the recovery room in stable condition.     Marylynn Pearson, MD     GA/MEDQ  D:  12/18/2013  T:  12/19/2013  Job:  [id number]

## 2013-12-19 NOTE — Discharge Summary (Signed)
Physician Discharge Summary  Patient ID: Laurie Allen MRN: [id number] DOB/AGE: 1945/10/13 68 y.o.  Admit date: 12/18/2013 Discharge date: 12/19/2013  Admission Diagnoses: pelvic relaxation  Discharge Diagnoses:  Active Problems:   Prolapse of female pelvic organs   Discharged Condition: stable  Hospital Course: Pt was admitted for routine post-op care.  Initially her pain was controlled with IV meds.  As her diet was advanced, she was given PO meds that controlled her pain.  Once she was ambulating, her foley was removed and she urinating without difficulty.  Vital signs and labs remained normal and she felt stable for discharge.  Her daughter lives with her to assist with home recovery care.  Consults: None  Significant Diagnostic Studies: labs: cbc  Treatments: IV hydration and surgery: LAVH, anterior repair  Discharge Exam: Blood pressure 105/53, pulse 73, temperature 97.5 F (36.4 C), temperature source Oral, resp. rate 16, height 5\' 5"  (1.651 m), weight 73.029 kg (161 lb), SpO2 100.00%. General appearance: alert and cooperative GI: normal findings: soft, non-tender Incision/Wound:clean and intact  Disposition: 01-Home or Self Care     Medication List         calcium-vitamin D 500-200 MG-UNIT per tablet  Commonly known as:  OSCAL WITH D  Take 2 tablets by mouth daily with breakfast.     gabapentin 300 MG capsule  Commonly known as:  NEURONTIN  Take 300 mg by mouth 3 (three) times daily.     ibuprofen 600 MG tablet  Commonly known as:  ADVIL,MOTRIN  Take 1 tablet (600 mg total) by mouth every 6 (six) hours as needed.     LIQUID TEARS 1.4 % ophthalmic solution  Generic drug:  polyvinyl alcohol  Place 1 drop into both eyes daily as needed for dry eyes.     LORazepam 0.5 MG tablet  Commonly known as:  ATIVAN  Take 0.5 mg by mouth every 8 (eight) hours as needed for anxiety.     magnesium gluconate 500 MG tablet  Commonly known as:  MAGONATE  Take 250 mg by  mouth at bedtime.     MULTI-VITAMIN PO  Take 1 tablet by mouth daily.     oxyCODONE-acetaminophen 5-325 MG per tablet  Commonly known as:  PERCOCET/ROXICET  Take 1-2 tablets by mouth every 4 (four) hours as needed for severe pain (moderate to severe pain (when tolerating fluids)).     pantoprazole 40 MG tablet  Commonly known as:  PROTONIX  Take 40 mg by mouth daily.     VITAMIN B-COMPLEX PO  Take 1 tablet by mouth daily.     Vitamin D3 1000 UNITS Caps  Take 1 capsule by mouth daily.           Follow-up Information   Schedule an appointment as soon as possible for a visit in 2 weeks to follow up.      Signed: ADKINS,GRETCHEN 12/19/2013, 8:28 AM

## 2013-12-19 NOTE — Discharge Instructions (Signed)

## 2013-12-19 NOTE — Progress Notes (Signed)
Pt is discharged in the care of Grandson with [facility]. Denies any pain or discomfort. Discharged instructions with Rx were given to pt.Questions were asked and answered. Abdominal dressings are clean and dry. Spirits are good

## 2014-01-01 ENCOUNTER — Telehealth: Payer: Self-pay

## 2014-01-01 NOTE — Telephone Encounter (Signed)
Rcvd fax dtd 12/13/13 discharge summary.

## 2014-01-16 ENCOUNTER — Encounter: Payer: Medicare Other | Admitting: Physical Therapy

## 2014-01-19 ENCOUNTER — Encounter: Payer: Medicare Other | Admitting: Physical Therapy

## 2014-01-31 ENCOUNTER — Telehealth: Payer: Self-pay | Admitting: Hematology

## 2014-01-31 NOTE — Telephone Encounter (Signed)
, °

## 2014-02-08 ENCOUNTER — Telehealth: Payer: Self-pay | Admitting: Hematology and Oncology

## 2014-02-08 NOTE — Telephone Encounter (Signed)
, °

## 2014-02-14 ENCOUNTER — Other Ambulatory Visit: Payer: Medicare Other

## 2014-02-14 ENCOUNTER — Ambulatory Visit: Payer: Medicare Other

## 2014-02-26 ENCOUNTER — Other Ambulatory Visit: Payer: Self-pay | Admitting: *Deleted

## 2014-02-26 DIAGNOSIS — C50911 Malignant neoplasm of unspecified site of right female breast: Secondary | ICD-10-CM

## 2014-02-27 ENCOUNTER — Telehealth: Payer: Self-pay | Admitting: Hematology and Oncology

## 2014-02-27 ENCOUNTER — Encounter: Payer: Self-pay | Admitting: Hematology and Oncology

## 2014-02-27 ENCOUNTER — Other Ambulatory Visit (HOSPITAL_BASED_OUTPATIENT_CLINIC_OR_DEPARTMENT_OTHER): Payer: Medicare Other

## 2014-02-27 ENCOUNTER — Ambulatory Visit (HOSPITAL_BASED_OUTPATIENT_CLINIC_OR_DEPARTMENT_OTHER): Payer: Medicare Other | Admitting: Hematology and Oncology

## 2014-02-27 VITALS — BP 134/61 | HR 75 | Temp 97.9°F | Resp 18 | Ht 65.0 in | Wt 161.4 lb

## 2014-02-27 DIAGNOSIS — C50911 Malignant neoplasm of unspecified site of right female breast: Secondary | ICD-10-CM

## 2014-02-27 DIAGNOSIS — Z853 Personal history of malignant neoplasm of breast: Secondary | ICD-10-CM

## 2014-02-27 DIAGNOSIS — K219 Gastro-esophageal reflux disease without esophagitis: Secondary | ICD-10-CM

## 2014-02-27 DIAGNOSIS — R0989 Other specified symptoms and signs involving the circulatory and respiratory systems: Secondary | ICD-10-CM

## 2014-02-27 LAB — COMPREHENSIVE METABOLIC PANEL (CC13)
ALBUMIN: 3.9 g/dL (ref 3.5–5.0)
ALT: 16 U/L (ref 0–55)
ANION GAP: 8 meq/L (ref 3–11)
AST: 21 U/L (ref 5–34)
Alkaline Phosphatase: 85 U/L (ref 40–150)
BUN: 22.3 mg/dL (ref 7.0–26.0)
CHLORIDE: 104 meq/L (ref 98–109)
CO2: 27 meq/L (ref 22–29)
Calcium: 9.7 mg/dL (ref 8.4–10.4)
Creatinine: 1.1 mg/dL (ref 0.6–1.1)
Glucose: 84 mg/dl (ref 70–140)
POTASSIUM: 4.1 meq/L (ref 3.5–5.1)
SODIUM: 139 meq/L (ref 136–145)
Total Bilirubin: 0.43 mg/dL (ref 0.20–1.20)
Total Protein: 7.6 g/dL (ref 6.4–8.3)

## 2014-02-27 LAB — CBC WITH DIFFERENTIAL/PLATELET
BASO%: 0.3 % (ref 0.0–2.0)
Basophils Absolute: 0 10*3/uL (ref 0.0–0.1)
EOS%: 1.7 % (ref 0.0–7.0)
Eosinophils Absolute: 0.1 10*3/uL (ref 0.0–0.5)
HEMATOCRIT: 38.9 % (ref 34.8–46.6)
HGB: 12.5 g/dL (ref 11.6–15.9)
LYMPH#: 1.2 10*3/uL (ref 0.9–3.3)
LYMPH%: 33.3 % (ref 14.0–49.7)
MCH: 29.4 pg (ref 25.1–34.0)
MCHC: 32.1 g/dL (ref 31.5–36.0)
MCV: 91.5 fL (ref 79.5–101.0)
MONO#: 0.2 10*3/uL (ref 0.1–0.9)
MONO%: 5.6 % (ref 0.0–14.0)
NEUT#: 2.1 10*3/uL (ref 1.5–6.5)
NEUT%: 59.1 % (ref 38.4–76.8)
Platelets: 214 10*3/uL (ref 145–400)
RBC: 4.25 10*6/uL (ref 3.70–5.45)
RDW: 13.8 % (ref 11.2–14.5)
WBC: 3.6 10*3/uL — AB (ref 3.9–10.3)

## 2014-02-27 NOTE — Assessment & Plan Note (Addendum)
Right breast cancer stage II ER/PR negative HER-2 positive status post neoadjuvant chemotherapy followed by lumpectomy and axillary dissection followed by radiation therapy and maintenance Herceptin. She is being followed with the screening mammograms every June. June 2014 she had an abnormality in the right breast that required an additional mammogram in January 2015. That was normal and hence she will need to be set up for another set of mammograms. The breast exams were normal. I would like to see her back every 6 months for followup and breast exams.  Discussed the importance of physical exercise in decreasing the likelihood of breast cancer recurrence. Recommended 30 mins daily 6 days a week of either brisk walking or cycling or swimming. Encouraged patient to eat more fruits and vegetables and decrease red meat. She plans to start exercise program once she is released by her gynecologist because of recent hysterectomy.  Severe acid reflux: Patient is on Protonix. I recommended that when she stops exercise her symptoms should get better.  Chest congestion: Would like to get an echocardiogram to assess of Herceptin has constant heart function abnormalities.   Return to clinic in 6 weeks to discuss the echocardiogram and mammogram reports

## 2014-02-27 NOTE — Progress Notes (Signed)
Patient Care Team: Kandice Hams, MD as PCP - General (Internal Medicine) Haywood Lasso, MD as Surgeon (General Surgery) Deatra Robinson, MD as Consulting Physician (Internal Medicine) Selinda Orion, MD as Attending Physician (Obstetrics and Gynecology) Marye Round, MD as Consulting Physician (Radiation Oncology)  DIAGNOSIS: Breast cancer, IDC, right, Stage II, receptor -, Her2neu+   Primary site: Breast (Left)   Staging method: AJCC 7th Edition   Clinical: Stage IIA (T1, N1, cM0) signed by Deatra Robinson, MD on 08/17/2013 10:58 AM   Pathologic: Stage IA (T1, N0, cM0) signed by Deatra Robinson, MD on 08/17/2013 10:59 AM   Summary: Stage IA (T1, N0, cM0)   SUMMARY OF ONCOLOGIC HISTORY:   Breast cancer, IDC, right, Stage II, receptor -, Her2neu+   06/05/2010 Initial Diagnosis Breast cancer, IDC, right, Stage II, ER/PR negative, Her2neu positive   06/29/2010 - 10/07/2010 Neo-Adjuvant Chemotherapy [facility] chemotherapy   11/12/2010 Surgery Right breast lumpectomy with axillary lymph node dissection revealing microscopic residual disease IDC grade 2; 18 lymph nodes negative   12/29/2010 - 02/02/2011 Radiation Therapy Radiation therapy to lumpectomy site    - 09/17/2011 Chemotherapy Herceptin maintenance for one year completed 09/17/2011    CHIEF COMPLIANT: Six-month followup of history of breast cancer  INTERVAL HISTORY: Laurie Allen is a 68 yr old African American lady with above-mentioned history of HER-2 positive breast cancer that was treated with neoadjuvant chemotherapy followed by lumpectomy in the right breast and radiation therapy followed by Herceptin maintenance dose completed in March 2013. The port was removed. She is here for routine followup. she has not done her annual mammograms which are usually done in June. For complaints of related to severe acid reflux. She recently had a hysterectomy and hence she is not exercising because of that. She also complains of fluttering sensation in  the chest which is making her uncomfortable.   REVIEW OF SYSTEMS:   Constitutional: Denies fevers, chills or abnormal weight loss Eyes: Denies blurriness of vision Ears, nose, mouth, throat, and face: Denies mucositis or sore throat Respiratory: Denies cough, dyspnea or wheezes Cardiovascular: Denies palpitation, chest discomfort or lower extremity swelling Gastrointestinal:  Denies nausea, heartburn or change in bowel habits Skin: Denies abnormal skin rashes Lymphatics: Denies new lymphadenopathy or easy bruising Neurological:Denies numbness, tingling or new weaknesses Behavioral/Psych: Mood is stable, no new changes  Breast: Tenderness beneath the right breast into the axilla where she had radiation All other systems were reviewed with the patient and are negative.  I have reviewed the past medical history, past surgical history, social history and family history with the patient and they are unchanged from previous note.  ALLERGIES:  is allergic to compazine.  MEDICATIONS:  Current Outpatient Prescriptions  Medication Sig Dispense Refill  . B Complex Vitamins (VITAMIN B-COMPLEX PO) Take 1 tablet by mouth daily.       . calcium-vitamin D (OSCAL WITH D) 500-200 MG-UNIT per tablet Take 2 tablets by mouth daily with breakfast.      . Cholecalciferol (VITAMIN D3) 1000 UNITS CAPS Take 1 capsule by mouth daily.       Marland Kitchen LORazepam (ATIVAN) 0.5 MG tablet Take 0.5 mg by mouth every 8 (eight) hours as needed for anxiety.       . magnesium gluconate (MAGONATE) 500 MG tablet Take 250 mg by mouth at bedtime.      . Multiple Vitamin (MULTI-VITAMIN PO) Take 1 tablet by mouth daily.       . pantoprazole (PROTONIX)  40 MG tablet Take 40 mg by mouth daily.      . polyvinyl alcohol (LIQUID TEARS) 1.4 % ophthalmic solution Place 1 drop into both eyes daily as needed for dry eyes.       . [DISCONTINUED] sertraline (ZOLOFT) 25 MG tablet Take 25 mg by mouth daily.       No current facility-administered  medications for this visit.    PHYSICAL EXAMINATION: ECOG PERFORMANCE STATUS: 1 - Symptomatic but completely ambulatory  Filed Vitals:   02/27/14 0923  BP: 134/61  Pulse: 75  Temp: 97.9 F (36.6 C)  Resp: 18   Filed Weights   02/27/14 0923  Weight: 161 lb 6.4 oz (73.211 kg)    GENERAL:alert, no distress and comfortable SKIN: skin color, texture, turgor are normal, no rashes or significant lesions EYES: normal, Conjunctiva are pink and non-injected, sclera clear OROPHARYNX:no exudate, no erythema and lips, buccal mucosa, and tongue normal  NECK: supple, thyroid normal size, non-tender, without nodularity LYMPH:  no palpable lymphadenopathy in the cervical, axillary or inguinal LUNGS: clear to auscultation and percussion with normal breathing effort HEART: regular rate & rhythm and no murmurs and no lower extremity edema ABDOMEN:abdomen soft, non-tender and normal bowel sounds Musculoskeletal:no cyanosis of digits and no clubbing  NEURO: alert & oriented x 3 with fluent speech, no focal motor/sensory deficits BREAST: No palpable masses lungs or nodules in either right or left breasts. No palpable axillary supraclavicular or infraclavicular adenopathy no breast tenderness or nipple discharge. We need the right breast there is an area of thickened skin and subcutis tissues from the effect of prior radiation therapy   LABORATORY DATA:  I have reviewed the data as listed   Chemistry      Component Value Date/Time   NA 139 02/27/2014 0904   NA 135 01/31/2012 2001   K 4.1 02/27/2014 0904   K 4.4 01/31/2012 2001   CL 104 08/22/2012 1246   CL 99 01/31/2012 2001   CO2 27 02/27/2014 0904   CO2 26 01/03/2012 2155   BUN 22.3 02/27/2014 0904   BUN 10 01/31/2012 2001   CREATININE 1.1 02/27/2014 0904   CREATININE 0.90 01/31/2012 2001      Component Value Date/Time   CALCIUM 9.7 02/27/2014 0904   CALCIUM 10.1 01/03/2012 2155   ALKPHOS 85 02/27/2014 0904   ALKPHOS 103 01/03/2012 2155   AST 21 02/27/2014 0904    AST 30 01/03/2012 2155   ALT 16 02/27/2014 0904   ALT 16 01/03/2012 2155   BILITOT 0.43 02/27/2014 0904   BILITOT 0.3 01/03/2012 2155       Lab Results  Component Value Date   WBC 3.6* 02/27/2014   HGB 12.5 02/27/2014   HCT 38.9 02/27/2014   MCV 91.5 02/27/2014   PLT 214 02/27/2014   NEUTROABS 2.1 02/27/2014     RADIOGRAPHIC STUDIES: I have personally reviewed the radiology reports and agreed with their findings. No results found.   ASSESSMENT & PLAN:  Breast cancer, IDC, right, Stage II, receptor -, Her2neu+ Right breast cancer stage II ER/PR negative HER-2 positive status post neoadjuvant chemotherapy followed by lumpectomy and axillary dissection followed by radiation therapy and maintenance Herceptin. She is being followed with the screening mammograms every June. June 2014 she had an abnormality in the right breast that required an additional mammogram in January 2015. That was normal and hence she will need to be set up for another set of mammograms. The breast exams were normal. I would like  to see her back every 6 months for followup and breast exams.  Discussed the importance of physical exercise in decreasing the likelihood of breast cancer recurrence. Recommended 30 mins daily 6 days a week of either brisk walking or cycling or swimming. Encouraged patient to eat more fruits and vegetables and decrease red meat. She plans to start exercise program once she is released by her gynecologist because of recent hysterectomy.  Severe acid reflux: Patient is on Protonix. I recommended that when she stops exercise her symptoms should get better.  Chest congestion: Would like to get an echocardiogram to assess of Herceptin has constant heart function abnormalities.   Return to clinic in 6 weeks to discuss the echocardiogram and mammogram reports     Orders Placed This Encounter  Procedures  . MM Digital Diagnostic Bilat    Standing Status: Future     Number of Occurrences:      Standing  Expiration Date: 02/27/2015    Order Specific Question:  Reason for Exam (SYMPTOM  OR DIAGNOSIS REQUIRED)    Answer:  H/O Right breast cancer annual eval    Order Specific Question:  Preferred imaging location?    Answer:  [facility]  . 2D Echocardiogram without contrast    Standing Status: Future     Number of Occurrences:      Standing Expiration Date: 02/27/2015    Scheduling Instructions:     PROVIDERS If this is NOT for EF then please REASON that  needs COMPLETE STUDY    Order Specific Question:  Type of Echo    Answer:  Complete    Order Specific Question:  Reason for Exam    Answer:  Shortness of breath previous herceptin    Order Specific Question:  Where should this test be performed    Answer:  [facility]   The patient has a good understanding of the overall plan. she agrees with it. She will call with any problems that may develop before her next visit here.  I spent 25 minutes counseling the patient face to face. The total time spent in the appointment was 30 minutes and more than 50% was on counseling and review of test results    Rulon Eisenmenger, MD 02/27/2014 10:21 AM

## 2014-02-27 NOTE — Telephone Encounter (Signed)
per pof to sch pt appt-gave pt copy of sch-sent ECHO to [facility] for pre-cert-will call pt with appt once reply

## 2014-02-28 ENCOUNTER — Telehealth: Payer: Self-pay | Admitting: Hematology and Oncology

## 2014-02-28 NOTE — Telephone Encounter (Signed)
per pof to sch ECHO-per Roena Malady [id number] & left pt a message for time & date location of appt

## 2014-03-07 ENCOUNTER — Ambulatory Visit (HOSPITAL_COMMUNITY)
Admission: RE | Admit: 2014-03-07 | Discharge: 2014-03-07 | Disposition: A | Discharge status: Home / Self Care | Payer: Medicare Other | Source: Ambulatory Visit | Attending: Hematology and Oncology | Admitting: Hematology and Oncology

## 2014-03-07 DIAGNOSIS — R059 Cough, unspecified: Secondary | ICD-10-CM | POA: Diagnosis not present

## 2014-03-07 DIAGNOSIS — I079 Rheumatic tricuspid valve disease, unspecified: Secondary | ICD-10-CM | POA: Diagnosis not present

## 2014-03-07 DIAGNOSIS — R05 Cough: Secondary | ICD-10-CM | POA: Insufficient documentation

## 2014-03-07 DIAGNOSIS — I10 Essential (primary) hypertension: Secondary | ICD-10-CM | POA: Insufficient documentation

## 2014-03-07 DIAGNOSIS — R002 Palpitations: Secondary | ICD-10-CM | POA: Diagnosis not present

## 2014-03-07 DIAGNOSIS — C50919 Malignant neoplasm of unspecified site of unspecified female breast: Secondary | ICD-10-CM | POA: Insufficient documentation

## 2014-03-07 DIAGNOSIS — I379 Nonrheumatic pulmonary valve disorder, unspecified: Secondary | ICD-10-CM | POA: Diagnosis not present

## 2014-03-07 DIAGNOSIS — C50911 Malignant neoplasm of unspecified site of right female breast: Secondary | ICD-10-CM

## 2014-03-07 DIAGNOSIS — I059 Rheumatic mitral valve disease, unspecified: Secondary | ICD-10-CM | POA: Diagnosis not present

## 2014-03-07 DIAGNOSIS — R0602 Shortness of breath: Secondary | ICD-10-CM

## 2014-03-07 NOTE — Progress Notes (Signed)
  Echocardiogram 2D Echocardiogram has been performed.  Darlina Sicilian M 03/07/2014, 11:59 AM

## 2014-03-08 ENCOUNTER — Ambulatory Visit
Admission: RE | Admit: 2014-03-08 | Discharge: 2014-03-08 | Disposition: A | Discharge status: Home / Self Care | Payer: Medicare Other | Source: Ambulatory Visit | Attending: Obstetrics and Gynecology | Admitting: Obstetrics and Gynecology

## 2014-03-08 DIAGNOSIS — Z853 Personal history of malignant neoplasm of breast: Secondary | ICD-10-CM

## 2014-03-08 DIAGNOSIS — Z9889 Other specified postprocedural states: Secondary | ICD-10-CM

## 2014-04-02 ENCOUNTER — Telehealth: Payer: Self-pay

## 2014-04-02 NOTE — Telephone Encounter (Signed)
Returned pt call re: echo results.  Let pt know ejection fraction was normal.  Pt voiced understanding.

## 2014-04-19 ENCOUNTER — Ambulatory Visit (HOSPITAL_BASED_OUTPATIENT_CLINIC_OR_DEPARTMENT_OTHER): Payer: Medicare Other | Admitting: Hematology and Oncology

## 2014-04-19 VITALS — BP 122/57 | HR 77 | Temp 98.0°F | Resp 18 | Ht 65.0 in | Wt 166.0 lb

## 2014-04-19 DIAGNOSIS — C50911 Malignant neoplasm of unspecified site of right female breast: Secondary | ICD-10-CM

## 2014-04-19 DIAGNOSIS — K2100 Gastro-esophageal reflux disease with esophagitis, without bleeding: Secondary | ICD-10-CM

## 2014-04-19 DIAGNOSIS — F419 Anxiety disorder, unspecified: Secondary | ICD-10-CM

## 2014-04-19 DIAGNOSIS — C773 Secondary and unspecified malignant neoplasm of axilla and upper limb lymph nodes: Secondary | ICD-10-CM

## 2014-04-19 DIAGNOSIS — C50919 Malignant neoplasm of unspecified site of unspecified female breast: Secondary | ICD-10-CM

## 2014-04-19 DIAGNOSIS — C50511 Malignant neoplasm of lower-outer quadrant of right female breast: Secondary | ICD-10-CM

## 2014-04-19 DIAGNOSIS — K219 Gastro-esophageal reflux disease without esophagitis: Secondary | ICD-10-CM

## 2014-04-19 DIAGNOSIS — Z171 Estrogen receptor negative status [ER-]: Secondary | ICD-10-CM

## 2014-04-19 NOTE — Assessment & Plan Note (Signed)
Severe GERD: Currently on proton next. I encouraged her to exercise regularly and to decrease fat and caffeine-containing foods. I encouraged her to discuss this with her primary care physician if more powerful drugs may be helpful to suppress acid reflux.

## 2014-04-19 NOTE — Progress Notes (Signed)
Patient Care Team: Kandice Hams, MD as PCP - General (Internal Medicine) Haywood Lasso, MD as Surgeon (General Surgery) Deatra Robinson, MD as Consulting Physician (Internal Medicine) Selinda Orion, MD as Attending Physician (Obstetrics and Gynecology) Marye Round, MD as Consulting Physician (Radiation Oncology)  DIAGNOSIS: Breast cancer of upper-outer quadrant of right female breast   Primary site: Breast (Left)   Staging method: AJCC 7th Edition   Clinical: Stage IIA (T1, N1, cM0) signed by Deatra Robinson, MD on 08/17/2013 10:58 AM   Pathologic: Stage IA (T1, N0, cM0) signed by Deatra Robinson, MD on 08/17/2013 10:59 AM   Summary: Stage IA (T1, N0, cM0)   SUMMARY OF ONCOLOGIC HISTORY:   Breast cancer of upper-outer quadrant of right female breast   06/05/2010 Initial Diagnosis Breast cancer, IDC, right, Stage II, ER/PR negative, Her2neu positive   06/29/2010 - 10/07/2010 Neo-Adjuvant Chemotherapy [place] chemotherapy   11/12/2010 Surgery Right breast lumpectomy with axillary lymph node dissection revealing microscopic residual disease IDC grade 2; 18 lymph nodes negative   12/29/2010 - 02/02/2011 Radiation Therapy Radiation therapy to lumpectomy site    - 09/17/2011 Chemotherapy Herceptin maintenance for one year completed 09/17/2011    CHIEF COMPLIANT: Followup after recent echocardiogram and mammogram  INTERVAL HISTORY: Laurie HOEFER is a 38 left maxillary with above-mentioned history of right-sided breast cancer that was treated with neoadjuvant chemotherapy followed by surgery and radiation followed by Herceptin maintenance it was completed in March 2013. She complains of a lot of acid reflux like symptoms as well as an uncomfortable feeling in the chest with some tremulousness and anxiety. We ordered an echocardiogram as well as breast mammograms and she is yet to discuss those results.  REVIEW OF SYSTEMS:   Constitutional: Denies fevers, chills or abnormal weight loss Eyes: Denies  blurriness of vision Ears, nose, mouth, throat, and face: Denies mucositis or sore throat Respiratory: Denies cough, dyspnea or wheezes Cardiovascular: Occasional palpitations Gastrointestinal:  Heartburn Skin: Denies abnormal skin rashes Lymphatics: Denies new lymphadenopathy or easy bruising Neurological:Denies numbness, tingling or new weaknesses Behavioral/Psych: Mood is stable, no new changes  Breast:  denies any pain or lumps or nodules in either breasts All other systems were reviewed with the patient and are negative.  I have reviewed the past medical history, past surgical history, social history and family history with the patient and they are unchanged from previous note.  ALLERGIES:  is allergic to compazine.  MEDICATIONS:  Current Outpatient Prescriptions  Medication Sig Dispense Refill  . B Complex Vitamins (VITAMIN B-COMPLEX PO) Take 1 tablet by mouth daily.       . calcium-vitamin D (OSCAL WITH D) 500-200 MG-UNIT per tablet Take 2 tablets by mouth daily with breakfast.      . Cholecalciferol (VITAMIN D3) 1000 UNITS CAPS Take 1 capsule by mouth daily.       Marland Kitchen LORazepam (ATIVAN) 0.5 MG tablet Take 0.5 mg by mouth every 8 (eight) hours as needed for anxiety.       . magnesium gluconate (MAGONATE) 500 MG tablet Take 250 mg by mouth at bedtime.      . Multiple Vitamin (MULTI-VITAMIN PO) Take 1 tablet by mouth daily.       . pantoprazole (PROTONIX) 40 MG tablet Take 40 mg by mouth daily.      . polyvinyl alcohol (LIQUID TEARS) 1.4 % ophthalmic solution Place 1 drop into both eyes daily as needed for dry eyes.       Marland Kitchen  hydrocortisone 2.5 % cream       . [DISCONTINUED] sertraline (ZOLOFT) 25 MG tablet Take 25 mg by mouth daily.       No current facility-administered medications for this visit.    PHYSICAL EXAMINATION: ECOG PERFORMANCE STATUS: 1 - Symptomatic but completely ambulatory  Filed Vitals:   04/19/14 1122  BP: 122/57  Pulse: 77  Temp: 98 F (36.7 C)  Resp: 18    Filed Weights   04/19/14 1122  Weight: 166 lb (75.297 kg)    GENERAL:alert, no distress and comfortable SKIN: skin color, texture, turgor are normal, no rashes or significant lesions EYES: normal, Conjunctiva are pink and non-injected, sclera clear OROPHARYNX:no exudate, no erythema and lips, buccal mucosa, and tongue normal  NECK: supple, thyroid normal size, non-tender, without nodularity LYMPH:  no palpable lymphadenopathy in the cervical, axillary or inguinal LUNGS: clear to auscultation and percussion with normal breathing effort HEART: regular rate & rhythm and no murmurs and no lower extremity edema ABDOMEN:abdomen soft, non-tender and normal bowel sounds Musculoskeletal:no cyanosis of digits and no clubbing  NEURO: alert & oriented x 3 with fluent speech, no focal motor/sensory deficits  LABORATORY DATA:  I have reviewed the data as listed   Chemistry      Component Value Date/Time   NA 139 02/27/2014 0904   NA 135 01/31/2012 2001   K 4.1 02/27/2014 0904   K 4.4 01/31/2012 2001   CL 104 08/22/2012 1246   CL 99 01/31/2012 2001   CO2 27 02/27/2014 0904   CO2 26 01/03/2012 2155   BUN 22.3 02/27/2014 0904   BUN 10 01/31/2012 2001   CREATININE 1.1 02/27/2014 0904   CREATININE 0.90 01/31/2012 2001      Component Value Date/Time   CALCIUM 9.7 02/27/2014 0904   CALCIUM 10.1 01/03/2012 2155   ALKPHOS 85 02/27/2014 0904   ALKPHOS 103 01/03/2012 2155   AST 21 02/27/2014 0904   AST 30 01/03/2012 2155   ALT 16 02/27/2014 0904   ALT 16 01/03/2012 2155   BILITOT 0.43 02/27/2014 0904   BILITOT 0.3 01/03/2012 2155       Lab Results  Component Value Date   WBC 3.6* 02/27/2014   HGB 12.5 02/27/2014   HCT 38.9 02/27/2014   MCV 91.5 02/27/2014   PLT 214 02/27/2014   NEUTROABS 2.1 02/27/2014     RADIOGRAPHIC STUDIES: I have personally reviewed the radiology reports and agreed with their findings. I reviewed the results of mammogram and echocardiogram. Echo showed an EF of 55-60%. No evidence of any diastolic  dysfunction of the heart  ASSESSMENT & PLAN:  Breast cancer of upper-outer quadrant of right female breast Right breast cancer stage II ER/PR negative HER-2 positive status post neoadjuvant chemotherapy followed by lumpectomy and axillary dissection followed by radiation therapy and maintenance Herceptin  Surveillance: I reviewed the results of the mammograms which were normal. Return to clinic in 6 months for followup  GERD (gastroesophageal reflux disease) Severe GERD: Currently on proton next. I encouraged her to exercise regularly and to decrease fat and caffeine-containing foods. I encouraged her to discuss this with her primary care physician if more powerful drugs may be helpful to suppress acid reflux.  Anxiety disorder Patient feels extremely anxious all the time and has uncomfortable feeling in the chest with some tremulousness. She's been on Ativan for a long time that she takes at bedtime to help her sleep. I discussed with her that Ativan after a certain time period, fails to  be effective. If necessary she needs to be referred to a psychiatrist to advise and help her with her anxiety problems. She will discuss this further with her primary care physician. She attributes all of these symptoms to prior chemotherapy and radiation.   Orders Placed This Encounter  Procedures  . CBC with Differential    Standing Status: Future     Number of Occurrences:      Standing Expiration Date: 04/19/2015  . Comprehensive metabolic panel (Cmet) - [facility]    Standing Status: Future     Number of Occurrences:      Standing Expiration Date: 04/19/2015   The patient has a good understanding of the overall plan. she agrees with it. She will call with any problems that may develop before her next visit here.  I spent 15 minutes counseling the patient face to face. The total time spent in the appointment was 20 minutes and more than 50% was on counseling and review of test results    Rulon Eisenmenger, MD 04/19/2014 12:39 PM

## 2014-04-19 NOTE — Assessment & Plan Note (Signed)
Right breast cancer stage II ER/PR negative HER-2 positive status post neoadjuvant chemotherapy followed by lumpectomy and axillary dissection followed by radiation therapy and maintenance Herceptin  Surveillance: I reviewed the results of the mammograms which were normal. Return to clinic in 6 months for followup

## 2014-04-19 NOTE — Assessment & Plan Note (Signed)
Patient feels extremely anxious all the time and has uncomfortable feeling in the chest with some tremulousness. She's been on Ativan for a long time that she takes at bedtime to help her sleep. I discussed with her that Ativan after a certain time period, fails to be effective. If necessary she needs to be referred to a psychiatrist to advise and help her with her anxiety problems. She will discuss this further with her primary care physician. She attributes all of these symptoms to prior chemotherapy and radiation.

## 2014-04-20 ENCOUNTER — Telehealth: Payer: Self-pay | Admitting: Hematology and Oncology

## 2014-05-17 ENCOUNTER — Other Ambulatory Visit: Payer: Self-pay | Admitting: Nurse Practitioner

## 2014-05-17 ENCOUNTER — Ambulatory Visit
Admission: RE | Admit: 2014-05-17 | Discharge: 2014-05-17 | Disposition: A | Discharge status: Home / Self Care | Payer: Medicare Other | Source: Ambulatory Visit | Attending: Nurse Practitioner | Admitting: Nurse Practitioner

## 2014-05-17 DIAGNOSIS — S93402A Sprain of unspecified ligament of left ankle, initial encounter: Secondary | ICD-10-CM

## 2014-06-27 DIAGNOSIS — R059 Cough, unspecified: Secondary | ICD-10-CM | POA: Insufficient documentation

## 2014-10-21 NOTE — Assessment & Plan Note (Addendum)
Right breast cancer stage II ER/PR negative HER-2 positive status post neoadjuvant chemotherapy followed by lumpectomy and axillary dissection followed by radiation therapy and maintenance Herceptin  Breast Cancer Surveillance: 1. Breast exam 10/22/14: Normal 2. Mammogram 03/09/15 No abnormalities. Postsurgical changes. Breast Density Category B. I recommended that she get 3-D mammograms for surveillance. Discussed the differences between different breast density categories.   Discussed the importance of physical exercise in decreasing the likelihood of breast cancer recurrence. Recommended 30 mins daily 6 days a week of either brisk walking or cycling or swimming. Encouraged patient to eat more fruits and vegetables and decrease red meat.   GERD Anxiety  RTC 28month

## 2014-10-22 ENCOUNTER — Ambulatory Visit (HOSPITAL_BASED_OUTPATIENT_CLINIC_OR_DEPARTMENT_OTHER): Payer: Commercial Managed Care - HMO | Admitting: Hematology and Oncology

## 2014-10-22 ENCOUNTER — Telehealth: Payer: Self-pay | Admitting: Hematology and Oncology

## 2014-10-22 ENCOUNTER — Other Ambulatory Visit (HOSPITAL_BASED_OUTPATIENT_CLINIC_OR_DEPARTMENT_OTHER): Payer: Commercial Managed Care - HMO

## 2014-10-22 VITALS — BP 112/51 | HR 69 | Temp 97.5°F | Resp 18 | Ht 65.0 in | Wt 167.3 lb

## 2014-10-22 DIAGNOSIS — Z853 Personal history of malignant neoplasm of breast: Secondary | ICD-10-CM | POA: Diagnosis not present

## 2014-10-22 DIAGNOSIS — C50911 Malignant neoplasm of unspecified site of right female breast: Secondary | ICD-10-CM

## 2014-10-22 DIAGNOSIS — C50411 Malignant neoplasm of upper-outer quadrant of right female breast: Secondary | ICD-10-CM

## 2014-10-22 DIAGNOSIS — C50919 Malignant neoplasm of unspecified site of unspecified female breast: Secondary | ICD-10-CM

## 2014-10-22 LAB — CBC WITH DIFFERENTIAL/PLATELET
BASO%: 0.2 % (ref 0.0–2.0)
BASOS ABS: 0 10*3/uL (ref 0.0–0.1)
EOS ABS: 0.1 10*3/uL (ref 0.0–0.5)
EOS%: 1.6 % (ref 0.0–7.0)
HEMATOCRIT: 38.3 % (ref 34.8–46.6)
HGB: 12.8 g/dL (ref 11.6–15.9)
LYMPH%: 30.8 % (ref 14.0–49.7)
MCH: 30.2 pg (ref 25.1–34.0)
MCHC: 33.4 g/dL (ref 31.5–36.0)
MCV: 90.3 fL (ref 79.5–101.0)
MONO#: 0.4 10*3/uL (ref 0.1–0.9)
MONO%: 8.4 % (ref 0.0–14.0)
NEUT#: 2.6 10*3/uL (ref 1.5–6.5)
NEUT%: 59 % (ref 38.4–76.8)
PLATELETS: 237 10*3/uL (ref 145–400)
RBC: 4.24 10*6/uL (ref 3.70–5.45)
RDW: 13.6 % (ref 11.2–14.5)
WBC: 4.4 10*3/uL (ref 3.9–10.3)
lymph#: 1.4 10*3/uL (ref 0.9–3.3)

## 2014-10-22 LAB — COMPREHENSIVE METABOLIC PANEL (CC13)
ALBUMIN: 4.1 g/dL (ref 3.5–5.0)
ALT: 14 U/L (ref 0–55)
ANION GAP: 13 meq/L — AB (ref 3–11)
AST: 22 U/L (ref 5–34)
Alkaline Phosphatase: 101 U/L (ref 40–150)
BUN: 11 mg/dL (ref 7.0–26.0)
CO2: 21 mEq/L — ABNORMAL LOW (ref 22–29)
CREATININE: 1 mg/dL (ref 0.6–1.1)
Calcium: 9.6 mg/dL (ref 8.4–10.4)
Chloride: 103 mEq/L (ref 98–109)
EGFR: 67 mL/min/{1.73_m2} — ABNORMAL LOW (ref >90)
GLUCOSE: 86 mg/dL (ref 70–140)
POTASSIUM: 4.8 meq/L (ref 3.5–5.1)
Sodium: 137 mEq/L (ref 136–145)
TOTAL PROTEIN: 7.5 g/dL (ref 6.4–8.3)
Total Bilirubin: 0.56 mg/dL (ref 0.20–1.20)

## 2014-10-22 NOTE — Telephone Encounter (Signed)
per pof to sch pt appt-gave pt copy of sch °

## 2014-10-22 NOTE — Progress Notes (Signed)
Patient Care Team: Seward Carol, MD as PCP - General (Internal Medicine) Neldon Mc, MD as Surgeon (General Surgery) Consuela Mimes, MD as Consulting Physician (Internal Medicine) Selinda Orion, MD as Attending Physician (Obstetrics and Gynecology) Kyung Rudd, MD as Consulting Physician (Radiation Oncology)  DIAGNOSIS: Breast cancer of upper-outer quadrant of right female breast   Staging form: Breast, AJCC 7th Edition     Clinical: Stage IIA (T1, N1, cM0) - Signed by Deatra Robinson, MD on 08/17/2013     Pathologic: Stage IA (T1, N0, cM0) - Signed by Deatra Robinson, MD on 08/17/2013   SUMMARY OF ONCOLOGIC HISTORY:   Breast cancer of upper-outer quadrant of right female breast   06/05/2010 Initial Diagnosis Breast cancer, IDC, right, Stage II, ER/PR negative, Her2neu positive   06/29/2010 - 10/07/2010 Neo-Adjuvant Chemotherapy TCH chemotherapy   11/12/2010 Surgery Right breast lumpectomy with axillary lymph node dissection revealing microscopic residual disease IDC grade 2; 18 lymph nodes negative   12/29/2010 - 02/02/2011 Radiation Therapy Radiation therapy to lumpectomy site    - 09/17/2011 Chemotherapy Herceptin maintenance for one year completed 09/17/2011    CHIEF COMPLIANT: follow-up of breast cancer, tenderness in the right breast  INTERVAL HISTORY: Laurie Allen is a 69 year old lady with above-mentioned history of right-sided breast cancer treated with neoadjuvant chemotherapy followed by lumpectomy radiation and Herceptin maintenance. She is here for a six-month follow-up she reports no new changes to her general health. She continues to have intermittent pain and discomfort of the surgical site.  REVIEW OF SYSTEMS:   Constitutional: Denies fevers, chills or abnormal weight loss Eyes: Denies blurriness of vision Ears, nose, mouth, throat, and face: Denies mucositis or sore throat Respiratory: Denies cough, dyspnea or wheezes Cardiovascular: Denies palpitation, chest discomfort or  lower extremity swelling Gastrointestinal:  Denies nausea, heartburn or change in bowel habits Skin: Denies abnormal skin rashes Lymphatics: Denies new lymphadenopathy or easy bruising Neurological:Denies numbness, tingling or new weaknesses Behavioral/Psych: Mood is stable, no new changes  Breast: right breast discomfort All other systems were reviewed with the patient and are negative.  I have reviewed the past medical history, past surgical history, social history and family history with the patient and they are unchanged from previous note.  ALLERGIES:  is allergic to compazine.  MEDICATIONS:  Current Outpatient Prescriptions  Medication Sig Dispense Refill  . B Complex Vitamins (VITAMIN B-COMPLEX PO) Take 1 tablet by mouth daily.     . calcium-vitamin D (OSCAL WITH D) 500-200 MG-UNIT per tablet Take 2 tablets by mouth daily with breakfast.    . Cholecalciferol (VITAMIN D3) 1000 UNITS CAPS Take 1 capsule by mouth daily.     . hydrocortisone 2.5 % cream     . LORazepam (ATIVAN) 0.5 MG tablet Take 0.5 mg by mouth every 8 (eight) hours as needed for anxiety.     . magnesium gluconate (MAGONATE) 500 MG tablet Take 250 mg by mouth at bedtime.    . Multiple Vitamin (MULTI-VITAMIN PO) Take 1 tablet by mouth daily.     . pantoprazole (PROTONIX) 40 MG tablet Take 40 mg by mouth daily.    . polyvinyl alcohol (LIQUID TEARS) 1.4 % ophthalmic solution Place 1 drop into both eyes daily as needed for dry eyes.     . vitamin E 400 UNIT capsule Take 400 Units by mouth daily.    . [DISCONTINUED] sertraline (ZOLOFT) 25 MG tablet Take 25 mg by mouth daily.     No current facility-administered medications for this  visit.    PHYSICAL EXAMINATION: ECOG PERFORMANCE STATUS: 1 - Symptomatic but completely ambulatory  Filed Vitals:   10/22/14 1117  BP: 112/51  Pulse: 69  Temp: 97.5 F (36.4 C)  Resp: 18   Filed Weights   10/22/14 1117  Weight: 167 lb 4.8 oz (75.887 kg)    GENERAL:alert, no  distress and comfortable SKIN: skin color, texture, turgor are normal, no rashes or significant lesions EYES: normal, Conjunctiva are pink and non-injected, sclera clear OROPHARYNX:no exudate, no erythema and lips, buccal mucosa, and tongue normal  NECK: supple, thyroid normal size, non-tender, without nodularity LYMPH:  no palpable lymphadenopathy in the cervical, axillary or inguinal LUNGS: clear to auscultation and percussion with normal breathing effort HEART: regular rate & rhythm and no murmurs and no lower extremity edema ABDOMEN:abdomen soft, non-tender and normal bowel sounds Musculoskeletal:no cyanosis of digits and no clubbing  NEURO: alert & oriented x 3 with fluent speech, no focal motor/sensory deficits BREAST:tenderness in the right breast no lumps or nodules palpated. (exam performed in the presence of a chaperone)  LABORATORY DATA:  I have reviewed the data as listed   Chemistry      Component Value Date/Time   NA 137 10/22/2014 1104   NA 135 01/31/2012 2001   K 4.8 10/22/2014 1104   K 4.4 01/31/2012 2001   CL 104 08/22/2012 1246   CL 99 01/31/2012 2001   CO2 21* 10/22/2014 1104   CO2 26 01/03/2012 2155   BUN 11.0 10/22/2014 1104   BUN 10 01/31/2012 2001   CREATININE 1.0 10/22/2014 1104   CREATININE 0.90 01/31/2012 2001      Component Value Date/Time   CALCIUM 9.6 10/22/2014 1104   CALCIUM 10.1 01/03/2012 2155   ALKPHOS 101 10/22/2014 1104   ALKPHOS 103 01/03/2012 2155   AST 22 10/22/2014 1104   AST 30 01/03/2012 2155   ALT 14 10/22/2014 1104   ALT 16 01/03/2012 2155   BILITOT 0.56 10/22/2014 1104   BILITOT 0.3 01/03/2012 2155       Lab Results  Component Value Date   WBC 4.4 10/22/2014   HGB 12.8 10/22/2014   HCT 38.3 10/22/2014   MCV 90.3 10/22/2014   PLT 237 10/22/2014   NEUTROABS 2.6 10/22/2014     RADIOGRAPHIC STUDIES: I have personally reviewed the radiology reports and agreed with their findings. Mammogram September 2015 postsurgical  changes  ASSESSMENT & PLAN:  Breast cancer of upper-outer quadrant of right female breast Right breast cancer stage II ER/PR negative HER-2 positive status post neoadjuvant chemotherapy followed by lumpectomy and axillary dissection followed by radiation therapy and maintenance Herceptin  Breast Cancer Surveillance: 1. Breast exam 10/22/14: Normal 2. Mammogram 03/09/15 No abnormalities. Postsurgical changes. Breast Density Category B. I recommended that she get 3-D mammograms for surveillance. Discussed the differences between different breast density categories.   Discussed the importance of physical exercise in decreasing the likelihood of breast cancer recurrence. Recommended 30 mins daily 6 days a week of either brisk walking or cycling or swimming. Encouraged patient to eat more fruits and vegetables and decrease red meat.   GERD Anxiety  RTC 29month    No orders of the defined types were placed in this encounter.   The patient has a good understanding of the overall plan. she agrees with it. She will call with any problems that may develop before her next visit here.   GRulon Eisenmenger MD

## 2014-11-25 ENCOUNTER — Emergency Department (HOSPITAL_COMMUNITY)
Admission: EM | Admit: 2014-11-25 | Discharge: 2014-11-25 | Disposition: A | Discharge status: Home / Self Care | Payer: Commercial Managed Care - HMO | Source: Home / Self Care | Attending: Internal Medicine | Admitting: Internal Medicine

## 2014-11-25 ENCOUNTER — Encounter (HOSPITAL_COMMUNITY): Payer: Self-pay | Admitting: *Deleted

## 2014-11-25 DIAGNOSIS — W57XXXA Bitten or stung by nonvenomous insect and other nonvenomous arthropods, initial encounter: Secondary | ICD-10-CM

## 2014-11-25 DIAGNOSIS — S80861A Insect bite (nonvenomous), right lower leg, initial encounter: Secondary | ICD-10-CM

## 2014-11-25 MED ORDER — PREDNISONE 50 MG PO TABS: 50.0000 mg | ORAL_TABLET | Freq: Every day | ORAL | Status: AC

## 2014-11-25 MED ORDER — MUPIROCIN 2 % EX OINT: 1.0000 "application " | TOPICAL_OINTMENT | Freq: Two times a day (BID) | CUTANEOUS | Status: AC

## 2014-11-25 NOTE — Discharge Instructions (Signed)
You have had a severe local reaction to insect sting.  Ice bag for 5-10 minutes several times daily will help.  Prescription for prednisone to decrease swelling/pain.  Prescription for mupirocin ointment: apply thin layer to central blistered area daily, after washing gently with soap/water.  Recheck in 2 days at the urgent care or with PCP/Ronald Polite.   Bee, Wasp, or Hornet Sting Your caregiver has diagnosed you as having an insect sting. An insect sting appears as a red lump in the skin that sometimes has a tiny hole in the center, or it may have a stinger in the center of the wound. The most common stings are from wasps, hornets and bees. Individuals have different reactions to insect stings.  A normal reaction may cause pain, swelling, and redness around the sting site.  A localized allergic reaction may cause swelling and redness that extends beyond the sting site.  A large local reaction may continue to develop over the next 12 to 36 hours.  On occasion, the reactions can be severe (anaphylactic reaction). An anaphylactic reaction may cause wheezing; difficulty breathing; chest pain; fainting; raised, itchy, red patches on the skin; a sick feeling to your stomach (nausea); vomiting; cramping; or diarrhea. If you have had an anaphylactic reaction to an insect sting in the past, you are more likely to have one again. HOME CARE INSTRUCTIONS   With bee stings, a small sac of poison is left in the wound. Brushing across this with something such as a credit card, or anything similar, will help remove this and decrease the amount of the reaction. This same procedure will not help a wasp sting as they do not leave behind a stinger and poison sac.  Apply a cold compress for 10 to 20 minutes every hour for 1 to 2 days, depending on severity, to reduce swelling and itching.  To lessen pain, a paste made of water and baking soda may be rubbed on the bite or sting and left on for 5 minutes.  To  relieve itching and swelling, you may use take medication or apply medicated creams or lotions as directed.  Only take over-the-counter or prescription medicines for pain, discomfort, or fever as directed by your caregiver.  Wash the sting site daily with soap and water. Apply antibiotic ointment on the sting site as directed.  If you suffered a severe reaction:  If you did not require hospitalization, an adult will need to stay with you for 24 hours in case the symptoms return.  You may need to wear a medical bracelet or necklace stating the allergy.  You and your family need to learn when and how to use an anaphylaxis kit or epinephrine injection.  If you have had a severe reaction before, always carry your anaphylaxis kit with you. SEEK MEDICAL CARE IF:   None of the above helps within 2 to 3 days.  The area becomes red, warm, tender, and swollen beyond the area of the bite or sting.  You have an oral temperature above 102 F (38.9 C). SEEK IMMEDIATE MEDICAL CARE IF:  You have symptoms of an allergic reaction which are:  Wheezing.  Difficulty breathing.  Chest pain.  Lightheadedness or fainting.  Itchy, raised, red patches on the skin.  Nausea, vomiting, cramping or diarrhea. ANY OF THESE SYMPTOMS MAY REPRESENT A SERIOUS PROBLEM THAT IS AN EMERGENCY. Do not wait to see if the symptoms will go away. Get medical help right away. Call your local emergency services (911  in [place] DO NOT drive yourself to the hospital. MAKE SURE YOU:   Understand these instructions.  Will watch your condition.  Will get help right away if you are not doing well or get worse. Document Released: 06/15/2005 Document Revised: 09/07/2011 Document Reviewed: 11/30/2009 [facility] Patient Information 2015 [place], [place]. This information is not intended to replace advice given to you by your health care provider. Make sure you discuss any questions you have with your health care provider.

## 2014-11-25 NOTE — ED Provider Notes (Signed)
CSN: [id number]     Arrival date & time 11/25/14  1318 History   First MD Initiated Contact with Patient 11/25/14 1426     Chief Complaint  Patient presents with  . Insect Bite   HPI  Some sort of insect in the right medial mid thigh 2 days ago. She has had expanding redness with swelling and discomfort since. The center of the area, where the sting occurred, is blistered. No coughing, no wheezing, not short of breath. No abdominal discomfort, no nausea or vomiting.  Past Medical History  Diagnosis Date  . Anxiety   . GERD (gastroesophageal reflux disease)   . Hx Breast cancer, IDC, Right, Stage II, Receptor -, Her 2 + 06/05/2010  . Rash 06/04/2011    history  . Palpitations     History r/t anxiety on LORazepam - no problems since meds  . Breast cancer   . GERD (gastroesophageal reflux disease) 12/18/2011  . SVD (spontaneous vaginal delivery)     x 4  . Neuromuscular disorder     tingling/burning in feet - tx gabapentin   Past Surgical History  Procedure Laterality Date  . Foot surgery      bilateral bunions removed  . Bladder surgery    . Mastectomy partial / lumpectomy w/ axillary lymphadenectomy  01/06/2011    Right- Dr Margot Chimes  . Portacath placement  06/17/2010  . Port-a-cath removal  10/12/2011    Procedure: REMOVAL PORT-A-CATH;  Surgeon: Haywood Lasso, MD;  Location: [facility];  Service: General;  Laterality: Right;  . Esophageal manometry  01/18/2012    Procedure: ESOPHAGEAL MANOMETRY (EM);  Surgeon: Garlan Fair, MD;  Location: [facility];  Service: Endoscopy;  Laterality: N/A;  . 24 hour ph study  01/18/2012    Procedure: [place] STUDY;  Surgeon: Garlan Fair, MD;  Location: [facility];  Service: Endoscopy;  Laterality: N/A;  . Breast surgery    . Breast lumpectomy Right 2012    with lymph nodes  . Tubal ligation    . Laparoscopic assisted vaginal hysterectomy Bilateral 12/18/2013    Procedure: LAPAROSCOPIC ASSISTED VAGINAL HYSTERECTOMY  WITH BILATERAL SALPINGO OOPHORECTOMY;  Surgeon: Marylynn Pearson, MD;  Location: [facility];  Service: Gynecology;  Laterality: Bilateral;  . Anterior and posterior repair N/A 12/18/2013    Procedure: ANTERIOR (CYSTOCELE) ;  Surgeon: Marylynn Pearson, MD;  Location: [facility];  Service: Gynecology;  Laterality: N/A;   Family History  Problem Relation Age of Onset  . Cancer Father 38    ? type   History  Substance Use Topics  . Smoking status: Never Smoker   . Smokeless tobacco: Never Used  . Alcohol Use: No   OB History    No data available     Review of Systems  All other systems reviewed and are negative.   Allergies  Compazine  Home Medications   Prior to Admission medications   Medication Sig Start Date End Date Taking? Authorizing Provider  B Complex Vitamins (VITAMIN B-COMPLEX PO) Take 1 tablet by mouth daily.     Historical Provider, MD  calcium-vitamin D (OSCAL WITH D) 500-200 MG-UNIT per tablet Take 2 tablets by mouth daily with breakfast.    Historical Provider, MD  Cholecalciferol (VITAMIN D3) 1000 UNITS CAPS Take 1 capsule by mouth daily.     Historical Provider, MD  hydrocortisone 2.5 % cream  03/26/14   Historical Provider, MD  LORazepam (ATIVAN) 0.5 MG tablet Take 0.5 mg by mouth  every 8 (eight) hours as needed for anxiety.  12/09/11   Historical Provider, MD  magnesium gluconate (MAGONATE) 500 MG tablet Take 250 mg by mouth at bedtime.    Historical Provider, MD  Multiple Vitamin (MULTI-VITAMIN PO) Take 1 tablet by mouth daily.     Historical Provider, MD  pantoprazole (PROTONIX) 40 MG tablet Take 40 mg by mouth daily.    Historical Provider, MD  polyvinyl alcohol (LIQUID TEARS) 1.4 % ophthalmic solution Place 1 drop into both eyes daily as needed for dry eyes.     Historical Provider, MD  vitamin E 400 UNIT capsule Take 400 Units by mouth daily.    Historical Provider, MD   BP 144/81 mmHg  Pulse 71  Temp(Src) 98.6 F (37 C) (Oral)  SpO2 100% Physical Exam   Constitutional: She is oriented to person, place, and time. No distress.  Alert, nicely groomed  HENT:  Head: Atraumatic.  Eyes:  Conjugate gaze, no eye redness/drainage  Neck: Neck supple.  Cardiovascular: Normal rate.   Pulmonary/Chest: No respiratory distress.  Lungs clear, symmetric breath sounds  Abdominal: She exhibits no distension.  Musculoskeletal: Normal range of motion.  No leg swelling  Neurological: She is alert and oriented to person, place, and time.  Skin: Skin is warm and dry.  No cyanosis Medial right thigh with a 10 x 7" area of erythema with slight induration, no drainage. 1.5 inch area centrally has some blisters, intact. No focal swelling, no fluctuance. Mildly tender to palpation. No bruising.  Nursing note and vitals reviewed.   ED Course  Procedures (including critical care time) Labs Review Labs Reviewed - No data to display  Imaging Review No results found.   MDM   1. Insect bite of right leg, initial encounter    Most consistent with severe local reaction to insect sting Prescription for prednisone burst, topical mupirocin. Apply ice for 5-10 minutes several times daily. Recheck with PCP or at urgent care in 2 days, to monitor for infection    Sherlene Shams, MD 11/25/14 1450

## 2014-11-25 NOTE — ED Notes (Signed)
Pt  Reports   She  Was    Stung  r  Upper  Thigh    2  Days  Ago        He  Has   Pain  And   Swelling   Noted       No  resp  Distress

## 2014-12-24 ENCOUNTER — Other Ambulatory Visit: Payer: Self-pay

## 2015-01-22 ENCOUNTER — Encounter: Payer: Self-pay | Admitting: Hematology and Oncology

## 2015-01-22 NOTE — Progress Notes (Signed)
I placed forms from [employer] on desk of nurse for dr. Lindi Adie

## 2015-01-23 NOTE — Progress Notes (Addendum)
Patient called at 1625 asking if forms are ready for pick up.  "I need to turn these in on January 28, 2015 for a cancer retreat.  Would like a call when ready."  After ended call note found from collaborative nurse that forms were found and ready for pick up tomorrow.  Voicemail left at 1632 by this nurse with this information.

## 2015-01-23 NOTE — Progress Notes (Signed)
01/23/15 16:20.  LMOVM - [employer] form found.  Let pt know would be available for pickup at front desk tomorrow.  Pt to call clinic if she has any questions.

## 2015-01-23 NOTE — Progress Notes (Signed)
Pt called asking if papers are ready for pickup yet. Her phone [phone]

## 2015-02-18 ENCOUNTER — Other Ambulatory Visit: Payer: Self-pay | Admitting: Obstetrics and Gynecology

## 2015-02-18 ENCOUNTER — Other Ambulatory Visit: Payer: Self-pay

## 2015-02-18 DIAGNOSIS — C50911 Malignant neoplasm of unspecified site of right female breast: Secondary | ICD-10-CM

## 2015-03-27 ENCOUNTER — Ambulatory Visit
Admission: RE | Admit: 2015-03-27 | Discharge: 2015-03-27 | Disposition: A | Discharge status: Home / Self Care | Payer: Commercial Managed Care - HMO | Source: Ambulatory Visit | Attending: Obstetrics and Gynecology | Admitting: Obstetrics and Gynecology

## 2015-03-27 DIAGNOSIS — C50911 Malignant neoplasm of unspecified site of right female breast: Secondary | ICD-10-CM

## 2015-04-21 NOTE — Assessment & Plan Note (Deleted)
Right breast cancer stage II ER/PR negative HER-2 positive status post neoadjuvant chemotherapy followed by lumpectomy and axillary dissection followed by radiation therapy and maintenance Herceptin  Breast Cancer Surveillance: 1. Breast exam 04/22/15: Normal 2. Mammogram 03/09/15 No abnormalities. Postsurgical changes. Breast Density Category B. I recommended that she get 3-D mammograms for surveillance. Discussed the differences between different breast density categories.  Discussed the importance of physical exercise in decreasing the likelihood of breast cancer recurrence. Recommended 30 mins daily 6 days a week of either brisk walking or cycling or swimming. Encouraged patient to eat more fruits and vegetables and decrease red meat.   GERD Anxiety  RTC 40month

## 2015-04-22 ENCOUNTER — Other Ambulatory Visit: Payer: Commercial Managed Care - HMO

## 2015-04-22 ENCOUNTER — Ambulatory Visit: Payer: Commercial Managed Care - HMO | Admitting: Hematology and Oncology

## 2015-04-26 ENCOUNTER — Telehealth: Payer: Self-pay | Admitting: Hematology and Oncology

## 2015-04-26 NOTE — Telephone Encounter (Signed)
R/s missed appt....the patient ok and aware of new d/t printed new sched

## 2015-04-28 NOTE — Assessment & Plan Note (Signed)
Right breast cancer stage II ER/PR negative HER-2 positive status post neoadjuvant chemotherapy followed by lumpectomy and axillary dissection followed by radiation therapy and maintenance Herceptin  Breast Cancer Surveillance: 1. Breast exam 04/29/15: Normal 2. Mammogram 03/09/15 No abnormalities. Postsurgical changes. Breast Density Category B. I recommended that she get 3-D mammograms for surveillance. Discussed the differences between different breast density categories.  Discussed the importance of physical exercise in decreasing the likelihood of breast cancer recurrence. Recommended 30 mins daily 6 days a week of either brisk walking or cycling or swimming. Encouraged patient to eat more fruits and vegetables and decrease red meat.   GERD Anxiety  RTC 14month

## 2015-04-29 ENCOUNTER — Other Ambulatory Visit (HOSPITAL_BASED_OUTPATIENT_CLINIC_OR_DEPARTMENT_OTHER): Payer: Commercial Managed Care - HMO

## 2015-04-29 ENCOUNTER — Ambulatory Visit (HOSPITAL_BASED_OUTPATIENT_CLINIC_OR_DEPARTMENT_OTHER): Payer: Commercial Managed Care - HMO | Admitting: Hematology and Oncology

## 2015-04-29 ENCOUNTER — Telehealth: Payer: Self-pay | Admitting: Hematology and Oncology

## 2015-04-29 ENCOUNTER — Encounter: Payer: Self-pay | Admitting: Hematology and Oncology

## 2015-04-29 VITALS — BP 131/55 | HR 74 | Temp 97.9°F | Resp 18 | Ht 65.0 in | Wt 168.8 lb

## 2015-04-29 DIAGNOSIS — Z853 Personal history of malignant neoplasm of breast: Secondary | ICD-10-CM

## 2015-04-29 DIAGNOSIS — C50411 Malignant neoplasm of upper-outer quadrant of right female breast: Secondary | ICD-10-CM

## 2015-04-29 LAB — CBC WITH DIFFERENTIAL/PLATELET
BASO%: 0.5 % (ref 0.0–2.0)
Basophils Absolute: 0 10*3/uL (ref 0.0–0.1)
EOS%: 1.3 % (ref 0.0–7.0)
Eosinophils Absolute: 0.1 10*3/uL (ref 0.0–0.5)
HCT: 38.3 % (ref 34.8–46.6)
HEMOGLOBIN: 12.5 g/dL (ref 11.6–15.9)
LYMPH#: 0.9 10*3/uL (ref 0.9–3.3)
LYMPH%: 23.5 % (ref 14.0–49.7)
MCH: 29.9 pg (ref 25.1–34.0)
MCHC: 32.7 g/dL (ref 31.5–36.0)
MCV: 91.2 fL (ref 79.5–101.0)
MONO#: 0.4 10*3/uL (ref 0.1–0.9)
MONO%: 10.1 % (ref 0.0–14.0)
NEUT%: 64.6 % (ref 38.4–76.8)
NEUTROS ABS: 2.4 10*3/uL (ref 1.5–6.5)
Platelets: 221 10*3/uL (ref 145–400)
RBC: 4.2 10*6/uL (ref 3.70–5.45)
RDW: 13.9 % (ref 11.2–14.5)
WBC: 3.7 10*3/uL — ABNORMAL LOW (ref 3.9–10.3)

## 2015-04-29 LAB — COMPREHENSIVE METABOLIC PANEL (CC13)
ALBUMIN: 3.8 g/dL (ref 3.5–5.0)
ALK PHOS: 93 U/L (ref 40–150)
ALT: 14 U/L (ref 0–55)
AST: 18 U/L (ref 5–34)
Anion Gap: 6 mEq/L (ref 3–11)
BILIRUBIN TOTAL: 0.3 mg/dL (ref 0.20–1.20)
BUN: 14.8 mg/dL (ref 7.0–26.0)
CO2: 26 mEq/L (ref 22–29)
CREATININE: 1.1 mg/dL (ref 0.6–1.1)
Calcium: 9.7 mg/dL (ref 8.4–10.4)
Chloride: 109 mEq/L (ref 98–109)
EGFR: 62 mL/min/{1.73_m2} — ABNORMAL LOW (ref >90)
GLUCOSE: 106 mg/dL (ref 70–140)
Potassium: 4.8 mEq/L (ref 3.5–5.1)
SODIUM: 141 meq/L (ref 136–145)
TOTAL PROTEIN: 7.1 g/dL (ref 6.4–8.3)

## 2015-04-29 MED ORDER — VITAMIN C 500 MG PO TABS: 500.0000 mg | ORAL_TABLET | Freq: Every day | ORAL | Status: DC

## 2015-04-29 MED ORDER — TRAZODONE HCL 100 MG PO TABS: 100.0000 mg | ORAL_TABLET | Freq: Every day | ORAL | Status: DC

## 2015-04-29 NOTE — Progress Notes (Signed)
Patient Care Team: Seward Carol, MD as PCP - General (Internal Medicine) Neldon Mc, MD as Surgeon (General Surgery) Consuela Mimes, MD as Consulting Physician (Internal Medicine) Selinda Orion, MD as Attending Physician (Obstetrics and Gynecology) Kyung Rudd, MD as Consulting Physician (Radiation Oncology)  DIAGNOSIS: Breast cancer of upper-outer quadrant of right female breast [facility])   Staging form: Breast, AJCC 7th Edition     Clinical: Stage IIA (T1, N1, cM0) - Signed by Deatra Robinson, MD on 08/17/2013     Pathologic: Stage IA (T1, N0, cM0) - Signed by Deatra Robinson, MD on 08/17/2013   SUMMARY OF ONCOLOGIC HISTORY:   Breast cancer of upper-outer quadrant of right female breast ([place])   06/05/2010 Initial Diagnosis Breast cancer, IDC, right, Stage II, ER/PR negative, Her2neu positive   06/29/2010 - 10/07/2010 Neo-Adjuvant Chemotherapy TCH chemotherapy   11/12/2010 Surgery Right breast lumpectomy with axillary lymph node dissection revealing microscopic residual disease IDC grade 2; 18 lymph nodes negative   12/29/2010 - 02/02/2011 Radiation Therapy Radiation therapy to lumpectomy site    - 09/17/2011 Chemotherapy Herceptin maintenance for one year completed 09/17/2011    CHIEF COMPLIANT: Follow-up of breast cancer  INTERVAL HISTORY: Laurie Allen is a 69 year old with above-mentioned history of HER-2 positive breast cancer that was treated with neoadjuvant chemotherapy followed by lumpectomy radiation and finished Herceptin maintenance as of March 2013. She was ER/PR negative and hence she did not need adjuvant anti-estrogen therapy. She is here for an annual follow-up visit. She complains of difficulty with sleeping as well as pain in the right breast and axilla. She had a mammogram in September which was normal.  REVIEW OF SYSTEMS:   Constitutional: Denies fevers, chills or abnormal weight loss Eyes: Denies blurriness of vision Ears, nose, mouth, throat, and face: Denies mucositis  or sore throat Respiratory: Denies cough, dyspnea or wheezes Cardiovascular: Denies palpitation, chest discomfort or lower extremity swelling Gastrointestinal:  Denies nausea, heartburn or change in bowel habits Skin: Denies abnormal skin rashes Lymphatics: Denies new lymphadenopathy or easy bruising Neurological:Denies numbness, tingling or new weaknesses Behavioral/Psych: Difficulty with sleeping she was prescribed Ativan and trazodone.  Breast: Pain in the right breast and axilla All other systems were reviewed with the patient and are negative.  I have reviewed the past medical history, past surgical history, social history and family history with the patient and they are unchanged from previous note.  ALLERGIES:  is allergic to compazine.  MEDICATIONS:  Current Outpatient Prescriptions  Medication Sig Dispense Refill  . B Complex Vitamins (VITAMIN B-COMPLEX PO) Take 1 tablet by mouth daily.     . calcium-vitamin D (OSCAL WITH D) 500-200 MG-UNIT per tablet Take 2 tablets by mouth daily with breakfast.    . Cholecalciferol (VITAMIN D3) 1000 UNITS CAPS Take 1 capsule by mouth daily.     . hydrocortisone 2.5 % cream     . LORazepam (ATIVAN) 0.5 MG tablet Take 0.5 mg by mouth every 8 (eight) hours as needed for anxiety.     . magnesium gluconate (MAGONATE) 500 MG tablet Take 250 mg by mouth at bedtime.    . Multiple Vitamin (MULTI-VITAMIN PO) Take 1 tablet by mouth daily.     . pantoprazole (PROTONIX) 40 MG tablet Take 40 mg by mouth daily.    . polyvinyl alcohol (LIQUID TEARS) 1.4 % ophthalmic solution Place 1 drop into both eyes daily as needed for dry eyes.     . vitamin E 400 UNIT capsule Take 400  Units by mouth daily.    . [DISCONTINUED] sertraline (ZOLOFT) 25 MG tablet Take 25 mg by mouth daily.     No current facility-administered medications for this visit.    PHYSICAL EXAMINATION: ECOG PERFORMANCE STATUS: 1 - Symptomatic but completely ambulatory  Filed Vitals:    04/29/15 0841  BP: 131/55  Pulse: 74  Temp: 97.9 F (36.6 C)  Resp: 18   Filed Weights   04/29/15 0841  Weight: 168 lb 12.8 oz (76.567 kg)    GENERAL:alert, no distress and comfortable SKIN: skin color, texture, turgor are normal, no rashes or significant lesions EYES: normal, Conjunctiva are pink and non-injected, sclera clear OROPHARYNX:no exudate, no erythema and lips, buccal mucosa, and tongue normal  NECK: supple, thyroid normal size, non-tender, without nodularity LYMPH:  no palpable lymphadenopathy in the cervical, axillary or inguinal LUNGS: clear to auscultation and percussion with normal breathing effort HEART: regular rate & rhythm and no murmurs and no lower extremity edema ABDOMEN:abdomen soft, non-tender and normal bowel sounds Musculoskeletal:no cyanosis of digits and no clubbing  NEURO: alert & oriented x 3 with fluent speech, no focal motor/sensory deficits BREAST: No palpable masses or nodules in either right or left breasts. No palpable axillary supraclavicular or infraclavicular adenopathy no breast tenderness or nipple discharge. (exam performed in the presence of a chaperone)  LABORATORY DATA:  I have reviewed the data as listed   Chemistry      Component Value Date/Time   NA 137 10/22/2014 1104   NA 135 01/31/2012 2001   K 4.8 10/22/2014 1104   K 4.4 01/31/2012 2001   CL 104 08/22/2012 1246   CL 99 01/31/2012 2001   CO2 21* 10/22/2014 1104   CO2 26 01/03/2012 2155   BUN 11.0 10/22/2014 1104   BUN 10 01/31/2012 2001   CREATININE 1.0 10/22/2014 1104   CREATININE 0.90 01/31/2012 2001      Component Value Date/Time   CALCIUM 9.6 10/22/2014 1104   CALCIUM 10.1 01/03/2012 2155   ALKPHOS 101 10/22/2014 1104   ALKPHOS 103 01/03/2012 2155   AST 22 10/22/2014 1104   AST 30 01/03/2012 2155   ALT 14 10/22/2014 1104   ALT 16 01/03/2012 2155   BILITOT 0.56 10/22/2014 1104   BILITOT 0.3 01/03/2012 2155       Lab Results  Component Value Date   WBC  3.7* 04/29/2015   HGB 12.5 04/29/2015   HCT 38.3 04/29/2015   MCV 91.2 04/29/2015   PLT 221 04/29/2015   NEUTROABS 2.4 04/29/2015    ASSESSMENT & PLAN:  Breast cancer of upper-outer quadrant of right female breast ([place]) Right breast cancer stage II ER/PR negative HER-2 positive status post neoadjuvant chemotherapy followed by lumpectomy and axillary dissection followed by radiation therapy and maintenance Herceptin  Breast Cancer Surveillance: 1. Breast exam 04/29/15: Normal 2. Mammogram 03/09/15 No abnormalities. Postsurgical changes. Breast Density Category B. I recommended that she get 3-D mammograms for surveillance. Discussed the differences between different breast density categories.  Survivorship: Patient exercises frequently and states healthy and active. Encouraged her to eat more fruits and vegetables and less red meat.  Breast discomfort and pain: No prior surgery and radiation: There is not much we can do about it at this point other than watch monitor it. No clinical concerns for disease relapse. I discussed with her that blood work does not show any evidence of breast cancer and hence we will not be repeating blood work in the future. I encouraged her to get  blood work with her primary care physician.  GERD Anxiety  RTC one year   No orders of the defined types were placed in this encounter.   The patient has a good understanding of the overall plan. she agrees with it. she will call with any problems that may develop before the next visit here.   Rulon Eisenmenger, MD 04/29/2015

## 2015-04-29 NOTE — Addendum Note (Signed)
Addended by: Prentiss Bells on: 04/29/2015 10:43 AM   Modules accepted: Medications

## 2015-04-29 NOTE — Telephone Encounter (Signed)
Appointments made and avs printed for patient °

## 2015-07-05 ENCOUNTER — Ambulatory Visit
Admission: RE | Admit: 2015-07-05 | Discharge: 2015-07-05 | Disposition: A | Discharge status: Home / Self Care | Payer: Commercial Managed Care - HMO | Source: Ambulatory Visit | Attending: Nurse Practitioner | Admitting: Nurse Practitioner

## 2015-07-05 ENCOUNTER — Other Ambulatory Visit: Payer: Self-pay | Admitting: Nurse Practitioner

## 2015-07-05 DIAGNOSIS — S0993XA Unspecified injury of face, initial encounter: Secondary | ICD-10-CM

## 2016-04-28 ENCOUNTER — Ambulatory Visit: Payer: Commercial Managed Care - HMO | Admitting: Hematology and Oncology

## 2016-07-13 ENCOUNTER — Telehealth: Payer: Self-pay | Admitting: Hematology and Oncology

## 2016-07-13 NOTE — Telephone Encounter (Signed)
Pt called to r/s missed appt. Gave pt next available appt per request

## 2016-07-14 DIAGNOSIS — Z1211 Encounter for screening for malignant neoplasm of colon: Secondary | ICD-10-CM | POA: Diagnosis not present

## 2016-07-14 DIAGNOSIS — Z7189 Other specified counseling: Secondary | ICD-10-CM | POA: Diagnosis not present

## 2016-07-14 DIAGNOSIS — Z1239 Encounter for other screening for malignant neoplasm of breast: Secondary | ICD-10-CM | POA: Diagnosis not present

## 2016-07-14 DIAGNOSIS — Z0000 Encounter for general adult medical examination without abnormal findings: Secondary | ICD-10-CM | POA: Diagnosis not present

## 2016-07-14 DIAGNOSIS — F419 Anxiety disorder, unspecified: Secondary | ICD-10-CM | POA: Diagnosis not present

## 2016-07-14 DIAGNOSIS — Z1389 Encounter for screening for other disorder: Secondary | ICD-10-CM | POA: Diagnosis not present

## 2016-07-16 ENCOUNTER — Ambulatory Visit (HOSPITAL_BASED_OUTPATIENT_CLINIC_OR_DEPARTMENT_OTHER): Payer: Medicare Other

## 2016-07-16 ENCOUNTER — Ambulatory Visit (HOSPITAL_BASED_OUTPATIENT_CLINIC_OR_DEPARTMENT_OTHER): Payer: Medicare Other | Admitting: Hematology and Oncology

## 2016-07-16 DIAGNOSIS — Z853 Personal history of malignant neoplasm of breast: Secondary | ICD-10-CM

## 2016-07-16 DIAGNOSIS — Z171 Estrogen receptor negative status [ER-]: Principal | ICD-10-CM

## 2016-07-16 DIAGNOSIS — C50411 Malignant neoplasm of upper-outer quadrant of right female breast: Secondary | ICD-10-CM

## 2016-07-16 LAB — COMPREHENSIVE METABOLIC PANEL
ALT: 12 U/L (ref 0–55)
ANION GAP: 8 meq/L (ref 3–11)
AST: 20 U/L (ref 5–34)
Albumin: 4.1 g/dL (ref 3.5–5.0)
Alkaline Phosphatase: 79 U/L (ref 40–150)
BILIRUBIN TOTAL: 0.38 mg/dL (ref 0.20–1.20)
BUN: 18.9 mg/dL (ref 7.0–26.0)
CO2: 27 meq/L (ref 22–29)
Calcium: 10 mg/dL (ref 8.4–10.4)
Chloride: 105 mEq/L (ref 98–109)
Creatinine: 0.9 mg/dL (ref 0.6–1.1)
EGFR: 72 mL/min/{1.73_m2} — AB (ref >90)
Glucose: 83 mg/dl (ref 70–140)
Potassium: 4.4 mEq/L (ref 3.5–5.1)
Sodium: 139 mEq/L (ref 136–145)
TOTAL PROTEIN: 7.3 g/dL (ref 6.4–8.3)

## 2016-07-16 LAB — CBC WITH DIFFERENTIAL/PLATELET
BASO%: 1.2 % (ref 0.0–2.0)
Basophils Absolute: 0 10*3/uL (ref 0.0–0.1)
EOS ABS: 0.1 10*3/uL (ref 0.0–0.5)
EOS%: 3.1 % (ref 0.0–7.0)
HCT: 37.1 % (ref 34.8–46.6)
HGB: 12.3 g/dL (ref 11.6–15.9)
LYMPH%: 34.6 % (ref 14.0–49.7)
MCH: 30.3 pg (ref 25.1–34.0)
MCHC: 33.2 g/dL (ref 31.5–36.0)
MCV: 91.4 fL (ref 79.5–101.0)
MONO#: 0.3 10*3/uL (ref 0.1–0.9)
MONO%: 9.6 % (ref 0.0–14.0)
NEUT#: 1.7 10*3/uL (ref 1.5–6.5)
NEUT%: 51.5 % (ref 38.4–76.8)
PLATELETS: 227 10*3/uL (ref 145–400)
RBC: 4.06 10*6/uL (ref 3.70–5.45)
RDW: 13.7 % (ref 11.2–14.5)
WBC: 3.2 10*3/uL — ABNORMAL LOW (ref 3.9–10.3)
lymph#: 1.1 10*3/uL (ref 0.9–3.3)

## 2016-07-16 NOTE — Assessment & Plan Note (Signed)
Right breast cancer stage II ER/PR negative HER-2 positive status post neoadjuvant chemotherapy followed by lumpectomy and axillary dissection followed by radiation therapy and maintenance Herceptin  Breast Cancer Surveillance: 1. Breast exam 07/16/2016: Normal 2. Mammogram 03/27/15 No abnormalities. Postsurgical changes. Breast Density Category B. I recommended that she get 3-D mammograms for surveillance. Discussed the differences between different breast density categories.  Survivorship: Patient exercises frequently and states healthy and active. Encouraged her to eat more fruits and vegetables and less red meat.  Breast discomfort and pain:  GERD Anxiety  RTC one year

## 2016-07-16 NOTE — Progress Notes (Signed)
Patient Care Team: Seward Carol, MD as PCP - General (Internal Medicine) Neldon Mc, MD as Surgeon (General Surgery) Marcy Panning, MD as Consulting Physician (Internal Medicine) Selinda Orion, MD (Inactive) as Attending Physician (Obstetrics and Gynecology) Kyung Rudd, MD as Consulting Physician (Radiation Oncology)  DIAGNOSIS:  Encounter Diagnosis  Name Primary?  . Malignant neoplasm of upper-outer quadrant of right breast in female, estrogen receptor negative (Laurie Allen)     SUMMARY OF ONCOLOGIC HISTORY:   Breast cancer of upper-outer quadrant of right female breast ([place]aurie Allen)   06/05/2010 Initial Diagnosis    Breast cancer, IDC, right, Stage II, ER/PR negative, Her2neu positive      06/29/2010 - 10/07/2010 Neo-Adjuvant Chemotherapy    TCH chemotherapy      11/12/2010 Surgery    Right breast lumpectomy with axillary lymph node dissection revealing microscopic residual disease IDC grade 2; 18 lymph nodes negative      12/29/2010 - 02/02/2011 Radiation Therapy    Radiation therapy to lumpectomy site       - 09/17/2011 Chemotherapy    Herceptin maintenance for one year completed 09/17/2011       CHIEF COMPLIANT: surveillance of breast cancer  INTERVAL HISTORY: Laurie Allen is a 71 year old with above-mentioned history of right breast cancer treated with neoadjuvant chemotherapy with [place] followed by atherectomy and radiation. She completed Herceptin maintenance in 2013. She is here for annual checkups. She continues to have tenderness in the right breast. For the past 4 months she has been in [place] helping her daughter recover from an ankle fracture. She is just returned back and is relaxing. She plans on a cruise vacation next week and is very excited about it today.  REVIEW OF SYSTEMS:   Constitutional: Denies fevers, chills or abnormal weight loss Eyes: Denies blurriness of vision Ears, nose, mouth, throat, and face: Denies mucositis or sore throat Respiratory: Denies  cough, dyspnea or wheezes Cardiovascular: Denies palpitation, chest discomfort Gastrointestinal:  Denies nausea, heartburn or change in bowel habits Skin: Denies abnormal skin rashes Lymphatics: Denies new lymphadenopathy or easy bruising Neurological:Denies numbness, tingling or new weaknesses Behavioral/Psych: Mood is stable, no new changes  Extremities: No lower extremity edema Breast:  Tenderness in the right breast All other systems were reviewed with the patient and are negative.  I have reviewed the past medical history, past surgical history, social history and family history with the patient and they are unchanged from previous note.  ALLERGIES:  is allergic to compazine.  MEDICATIONS:  Current Outpatient Prescriptions  Medication Sig Dispense Refill  . B Complex Vitamins (VITAMIN B-COMPLEX PO) Take 1 tablet by mouth daily.     . calcium-vitamin D (OSCAL WITH D) 500-200 MG-UNIT per tablet Take 2 tablets by mouth daily with breakfast.    . Cholecalciferol (VITAMIN D3) 1000 UNITS CAPS Take 1 capsule by mouth daily.     Marland Kitchen LORazepam (ATIVAN) 0.5 MG tablet Take 0.5 mg by mouth every 8 (eight) hours as needed for anxiety.     . pantoprazole (PROTONIX) 40 MG tablet Take 40 mg by mouth daily.    . polyvinyl alcohol (LIQUID TEARS) 1.4 % ophthalmic solution Place 1 drop into both eyes daily as needed for dry eyes.     . traZODone (DESYREL) 100 MG tablet Take 1 tablet (100 mg total) by mouth at bedtime.    . vitamin C (ASCORBIC ACID) 500 MG tablet Take 1 tablet (500 mg total) by mouth daily.    . vitamin E 400 UNIT capsule  Take 400 Units by mouth daily.     No current facility-administered medications for this visit.     PHYSICAL EXAMINATION: ECOG PERFORMANCE STATUS: 1 - Symptomatic but completely ambulatory  Vitals:   07/16/16 1034  BP: 129/68  Pulse: 97  Resp: 18  Temp: 98.1 F (36.7 C)   Filed Weights   07/16/16 1034  Weight: 162 lb (73.5 kg)    GENERAL:alert, no  distress and comfortable SKIN: skin color, texture, turgor are normal, no rashes or significant lesions EYES: normal, Conjunctiva are pink and non-injected, sclera clear OROPHARYNX:no exudate, no erythema and lips, buccal mucosa, and tongue normal  NECK: supple, thyroid normal size, non-tender, without nodularity LYMPH:  no palpable lymphadenopathy in the cervical, axillary or inguinal LUNGS: clear to auscultation and percussion with normal breathing effort HEART: regular rate & rhythm and no murmurs and no lower extremity edema ABDOMEN:abdomen soft, non-tender and normal bowel sounds MUSCULOSKELETAL:no cyanosis of digits and no clubbing  NEURO: alert & oriented x 3 with fluent speech, no focal motor/sensory deficits EXTREMITIES: No lower extremity edema BREAST:No palpable masses or nodules in either right or left breasts. No palpable axillary supraclavicular or infraclavicular adenopathy. Right breast tenderness to palpation (exam performed in the presence of a chaperone)  LABORATORY DATA:  I have reviewed the data as listed   Chemistry      Component Value Date/Time   NA 141 04/29/2015 0832   K 4.8 04/29/2015 0832   CL 104 08/22/2012 1246   CO2 26 04/29/2015 0832   BUN 14.8 04/29/2015 0832   CREATININE 1.1 04/29/2015 0832      Component Value Date/Time   CALCIUM 9.7 04/29/2015 0832   ALKPHOS 93 04/29/2015 0832   AST 18 04/29/2015 0832   ALT 14 04/29/2015 0832   BILITOT 0.30 04/29/2015 0832       Lab Results  Component Value Date   WBC 3.7 (L) 04/29/2015   HGB 12.5 04/29/2015   HCT 38.3 04/29/2015   MCV 91.2 04/29/2015   PLT 221 04/29/2015   NEUTROABS 2.4 04/29/2015    ASSESSMENT & PLAN:  Breast cancer of upper-outer quadrant of right female breast ([place]) Right breast cancer stage II ER/PR negative HER-2 positive status post neoadjuvant chemotherapy followed by lumpectomy and axillary dissection followed by radiation therapy and maintenance Herceptin  Breast Cancer  Surveillance: 1. Breast exam 07/16/2016: Normal 2. Mammogram 03/27/15 No abnormalities. Postsurgical changes. Breast Density Category B. I recommended that she get 3-D mammograms for surveillance. Discussed the differences between different breast density categories. Patient was away in [place] for the past 4 months and did not go mammogram. I encouraged her to schedule this mammogram after she returns back from her cruise vacation. Survivorship: Patient exercises frequently and states healthy and active. Encouraged her to eat more fruits and vegetables and less red meat.  Breast discomfort and pain:this appears to be the same as before  GERD Anxiety  RTC one year   I spent 15 minutes talking to the patient of which more than half was spent in counseling and coordination of care.  Orders Placed This Encounter  Procedures  . MM DIAG BREAST TOMO BILATERAL    Standing Status:   Future    Standing Expiration Date:   09/13/2017    Order Specific Question:   Reason for Exam (SYMPTOM  OR DIAGNOSIS REQUIRED)    Answer:   Annual mammogram with breast cancer    Order Specific Question:   Preferred imaging location?  Answer:   [facility]  . CBC with Differential/Platelet    Standing Status:   Future    Standing Expiration Date:   08/20/2017  . Comprehensive metabolic panel    Standing Status:   Future    Standing Expiration Date:   07/16/2017   The patient has a good understanding of the overall plan. she agrees with it. she will call with any problems that may develop before the next visit here.   Rulon Eisenmenger, MD 07/16/16

## 2016-07-17 DIAGNOSIS — N390 Urinary tract infection, site not specified: Secondary | ICD-10-CM | POA: Diagnosis not present

## 2016-07-17 DIAGNOSIS — Z6826 Body mass index (BMI) 26.0-26.9, adult: Secondary | ICD-10-CM | POA: Diagnosis not present

## 2016-07-17 DIAGNOSIS — Z1272 Encounter for screening for malignant neoplasm of vagina: Secondary | ICD-10-CM | POA: Diagnosis not present

## 2016-07-17 DIAGNOSIS — Z01419 Encounter for gynecological examination (general) (routine) without abnormal findings: Secondary | ICD-10-CM | POA: Diagnosis not present

## 2016-07-24 ENCOUNTER — Telehealth: Payer: Self-pay | Admitting: Hematology and Oncology

## 2016-07-24 NOTE — Telephone Encounter (Signed)
Appointments scheduled per 07/24/16 los. Left message on patient's voice mail regarding follow up appointment. Mailed copy of appointment schedule and appointment letter, per 07/16/16 los.

## 2016-08-19 ENCOUNTER — Ambulatory Visit
Admission: RE | Admit: 2016-08-19 | Discharge: 2016-08-19 | Disposition: A | Discharge status: Home / Self Care | Payer: Medicare Other | Source: Ambulatory Visit | Attending: Hematology and Oncology | Admitting: Hematology and Oncology

## 2016-08-19 DIAGNOSIS — C50411 Malignant neoplasm of upper-outer quadrant of right female breast: Secondary | ICD-10-CM

## 2016-08-19 DIAGNOSIS — Z171 Estrogen receptor negative status [ER-]: Principal | ICD-10-CM

## 2016-08-19 DIAGNOSIS — R922 Inconclusive mammogram: Secondary | ICD-10-CM | POA: Diagnosis not present

## 2016-08-20 ENCOUNTER — Encounter: Payer: Self-pay | Admitting: Hematology and Oncology

## 2016-10-29 ENCOUNTER — Telehealth: Payer: Self-pay | Admitting: Hematology and Oncology

## 2016-10-29 NOTE — Telephone Encounter (Signed)
Pt records mailed by IOD to [place] office.  HIM Faxed pharmacy letter to [employer]

## 2016-11-19 DIAGNOSIS — Z136 Encounter for screening for cardiovascular disorders: Secondary | ICD-10-CM | POA: Diagnosis not present

## 2016-11-19 DIAGNOSIS — R8290 Unspecified abnormal findings in urine: Secondary | ICD-10-CM | POA: Diagnosis not present

## 2016-11-19 DIAGNOSIS — Z13220 Encounter for screening for lipoid disorders: Secondary | ICD-10-CM | POA: Diagnosis not present

## 2016-11-19 DIAGNOSIS — Z0000 Encounter for general adult medical examination without abnormal findings: Secondary | ICD-10-CM | POA: Diagnosis not present

## 2016-11-19 DIAGNOSIS — K219 Gastro-esophageal reflux disease without esophagitis: Secondary | ICD-10-CM | POA: Diagnosis not present

## 2016-11-19 DIAGNOSIS — F419 Anxiety disorder, unspecified: Secondary | ICD-10-CM | POA: Diagnosis not present

## 2016-11-19 DIAGNOSIS — Z853 Personal history of malignant neoplasm of breast: Secondary | ICD-10-CM | POA: Diagnosis not present

## 2017-02-09 ENCOUNTER — Telehealth: Payer: Self-pay

## 2017-02-09 NOTE — Telephone Encounter (Signed)
Called and left a message with new appt due to pal  Joy Haegele

## 2017-05-07 ENCOUNTER — Telehealth: Payer: Self-pay

## 2017-05-07 NOTE — Telephone Encounter (Signed)
Pt called about needing some papers filled out for her attorney. I told her she could bring them in and we could get them to the right person for her depending on what needed to be filled out on them.  Cyndia Bent RN

## 2017-05-18 DIAGNOSIS — Z86010 Personal history of colonic polyps: Secondary | ICD-10-CM | POA: Diagnosis not present

## 2017-05-18 DIAGNOSIS — D126 Benign neoplasm of colon, unspecified: Secondary | ICD-10-CM | POA: Diagnosis not present

## 2017-05-25 DIAGNOSIS — D126 Benign neoplasm of colon, unspecified: Secondary | ICD-10-CM | POA: Diagnosis not present

## 2017-07-13 DIAGNOSIS — R69 Illness, unspecified: Secondary | ICD-10-CM | POA: Diagnosis not present

## 2017-07-14 ENCOUNTER — Other Ambulatory Visit: Payer: Self-pay

## 2017-07-14 DIAGNOSIS — Z171 Estrogen receptor negative status [ER-]: Principal | ICD-10-CM

## 2017-07-14 DIAGNOSIS — C50411 Malignant neoplasm of upper-outer quadrant of right female breast: Secondary | ICD-10-CM

## 2017-07-14 NOTE — Assessment & Plan Note (Signed)
Right breast cancer stage II ER/PR negative HER-2 positive status post neoadjuvant chemotherapy followed by lumpectomy and axillary dissection followed by radiation therapy and maintenance Herceptin  Breast Cancer Surveillance: 1. Breast exam 07/15/2017: Normal 2. Mammogram 08/19/2016 no abnormalities. Postsurgical changes. Breast Density Category C.   Breast discomfort and pain:this appears to be the same as before  GERD Anxiety  RTC one year

## 2017-07-15 ENCOUNTER — Telehealth: Payer: Self-pay | Admitting: Hematology and Oncology

## 2017-07-15 ENCOUNTER — Inpatient Hospital Stay (HOSPITAL_BASED_OUTPATIENT_CLINIC_OR_DEPARTMENT_OTHER): Payer: Medicare HMO | Admitting: Hematology and Oncology

## 2017-07-15 ENCOUNTER — Inpatient Hospital Stay: Payer: Medicare HMO | Attending: Hematology and Oncology

## 2017-07-15 DIAGNOSIS — Z171 Estrogen receptor negative status [ER-]: Secondary | ICD-10-CM

## 2017-07-15 DIAGNOSIS — K219 Gastro-esophageal reflux disease without esophagitis: Secondary | ICD-10-CM

## 2017-07-15 DIAGNOSIS — Z853 Personal history of malignant neoplasm of breast: Secondary | ICD-10-CM | POA: Insufficient documentation

## 2017-07-15 DIAGNOSIS — R69 Illness, unspecified: Secondary | ICD-10-CM | POA: Diagnosis not present

## 2017-07-15 DIAGNOSIS — F419 Anxiety disorder, unspecified: Secondary | ICD-10-CM | POA: Insufficient documentation

## 2017-07-15 DIAGNOSIS — C50411 Malignant neoplasm of upper-outer quadrant of right female breast: Secondary | ICD-10-CM

## 2017-07-15 LAB — CBC WITH DIFFERENTIAL (CANCER CENTER ONLY)
BASOS ABS: 0 10*3/uL (ref 0.0–0.1)
Basophils Relative: 0 %
Eosinophils Absolute: 0.1 10*3/uL (ref 0.0–0.5)
Eosinophils Relative: 2 %
HEMATOCRIT: 37.4 % (ref 34.8–46.6)
Hemoglobin: 12.1 g/dL (ref 11.6–15.9)
LYMPHS PCT: 41 %
Lymphs Abs: 1.2 10*3/uL (ref 0.9–3.3)
MCH: 30 pg (ref 25.1–34.0)
MCHC: 32.4 g/dL (ref 31.5–36.0)
MCV: 92.8 fL (ref 79.5–101.0)
MONOS PCT: 8 %
Monocytes Absolute: 0.2 10*3/uL (ref 0.1–0.9)
NEUTROS ABS: 1.4 10*3/uL — AB (ref 1.5–6.5)
Neutrophils Relative %: 49 %
Platelet Count: 219 10*3/uL (ref 145–400)
RBC: 4.03 MIL/uL (ref 3.70–5.45)
RDW: 13.3 % (ref 11.2–16.1)
WBC Count: 2.8 10*3/uL — ABNORMAL LOW (ref 3.9–10.3)

## 2017-07-15 LAB — CMP (CANCER CENTER ONLY)
ALT: 12 U/L (ref 0–55)
ANION GAP: 9 (ref 3–11)
AST: 22 U/L (ref 5–34)
Albumin: 4 g/dL (ref 3.5–5.0)
Alkaline Phosphatase: 81 U/L (ref 40–150)
BUN: 13 mg/dL (ref 7–26)
CO2: 26 mmol/L (ref 22–29)
Calcium: 9.4 mg/dL (ref 8.4–10.4)
Chloride: 104 mmol/L (ref 98–109)
Creatinine: 1.05 mg/dL (ref 0.60–1.10)
GFR, Est AFR Am: 60 mL/min — ABNORMAL LOW (ref >60)
GFR, Estimated: 52 mL/min — ABNORMAL LOW (ref >60)
Glucose, Bld: 107 mg/dL (ref 70–140)
POTASSIUM: 4.3 mmol/L (ref 3.3–4.7)
Sodium: 139 mmol/L (ref 136–145)
Total Bilirubin: 0.5 mg/dL (ref 0.2–1.2)
Total Protein: 7.3 g/dL (ref 6.4–8.3)

## 2017-07-15 NOTE — Progress Notes (Signed)
Patient Care Team: Seward Carol, MD as PCP - General (Internal Medicine) Neldon Mc, MD as Surgeon (General Surgery) Marcy Panning, MD as Consulting Physician (Internal Medicine) Selinda Orion, MD (Inactive) as Attending Physician (Obstetrics and Gynecology) Kyung Rudd, MD as Consulting Physician (Radiation Oncology)  DIAGNOSIS:  Encounter Diagnosis  Name Primary?  . Malignant neoplasm of upper-outer quadrant of right breast in female, estrogen receptor negative ([place])     SUMMARY OF ONCOLOGIC HISTORY:   Breast cancer of upper-outer quadrant of right female breast ([place])   06/05/2010 Initial Diagnosis    Breast cancer, IDC, right, Stage II, ER/PR negative, Her2neu positive      06/29/2010 - 10/07/2010 Neo-Adjuvant Chemotherapy    TCH chemotherapy      11/12/2010 Surgery    Right breast lumpectomy with axillary lymph node dissection revealing microscopic residual disease IDC grade 2; 18 lymph nodes negative      12/29/2010 - 02/02/2011 Radiation Therapy    Radiation therapy to lumpectomy site       - 09/17/2011 Chemotherapy    Herceptin maintenance for one year completed 09/17/2011       CHIEF COMPLIANT: Follow-up of history of breast cancer  INTERVAL HISTORY: Laurie Allen is a 72 year old with above-mentioned history of right breast cancer treated with neoadjuvant chemotherapy followed by lumpectomy radiation in 1 year of Herceptin treatment.  Because she is ER negative there is no role of antiestrogen therapy.  She is here for annual follow-up and reports no lumps or nodules.  REVIEW OF SYSTEMS:   Constitutional: Denies fevers, chills or abnormal weight loss Eyes: Denies blurriness of vision Ears, nose, mouth, throat, and face: Denies mucositis or sore throat Respiratory: Denies cough, dyspnea or wheezes Cardiovascular: Denies palpitation, chest discomfort Gastrointestinal:  Denies nausea, heartburn or change in bowel habits Skin: Denies abnormal skin  rashes Lymphatics: Denies new lymphadenopathy or easy bruising Neurological:Denies numbness, tingling or new weaknesses Behavioral/Psych: Mood is stable, no new changes  Extremities: No lower extremity edema Breast:  denies any pain or lumps or nodules in either breasts All other systems were reviewed with the patient and are negative.  I have reviewed the past medical history, past surgical history, social history and family history with the patient and they are unchanged from previous note.  ALLERGIES:  is allergic to compazine.  MEDICATIONS:  Current Outpatient Medications  Medication Sig Dispense Refill  . B Complex Vitamins (VITAMIN B-COMPLEX PO) Take 1 tablet by mouth daily.     . calcium-vitamin D (OSCAL WITH D) 500-200 MG-UNIT per tablet Take 2 tablets by mouth daily with breakfast.    . Cholecalciferol (VITAMIN D3) 1000 UNITS CAPS Take 1 capsule by mouth daily.     . pantoprazole (PROTONIX) 40 MG tablet Take 40 mg by mouth daily.    . polyvinyl alcohol (LIQUID TEARS) 1.4 % ophthalmic solution Place 1 drop into both eyes daily as needed for dry eyes.     . traZODone (DESYREL) 100 MG tablet Take 1 tablet (100 mg total) by mouth at bedtime.     No current facility-administered medications for this visit.     PHYSICAL EXAMINATION: ECOG PERFORMANCE STATUS: 0 - Asymptomatic  Vitals:   07/15/17 1126  BP: (!) 141/64  Pulse: 63  Resp: (!) 22  Temp: 97.7 F (36.5 C)  SpO2: 100%   Filed Weights   07/15/17 1126  Weight: 170 lb 4.8 oz (77.2 kg)    GENERAL:alert, no distress and comfortable SKIN: skin color, texture, turgor  are normal, no rashes or significant lesions EYES: normal, Conjunctiva are pink and non-injected, sclera clear OROPHARYNX:no exudate, no erythema and lips, buccal mucosa, and tongue normal  NECK: supple, thyroid normal size, non-tender, without nodularity LYMPH:  no palpable lymphadenopathy in the cervical, axillary or inguinal LUNGS: clear to  auscultation and percussion with normal breathing effort HEART: regular rate & rhythm and no murmurs and no lower extremity edema ABDOMEN:abdomen soft, non-tender and normal bowel sounds MUSCULOSKELETAL:no cyanosis of digits and no clubbing  NEURO: alert & oriented x 3 with fluent speech, no focal motor/sensory deficits EXTREMITIES: No lower extremity edema BREAST: No palpable masses or nodules in either right or left breasts. No palpable axillary supraclavicular or infraclavicular adenopathy no breast tenderness or nipple discharge. (exam performed in the presence of a chaperone)  LABORATORY DATA:  I have reviewed the data as listed CMP Latest Ref Rng & Units 07/15/2017 07/16/2016 04/29/2015  Glucose 70 - 140 mg/dL 107 83 106  BUN 7 - 26 mg/dL 13 18.9 14.8  Creatinine 0.6 - 1.1 mg/dL - 0.9 1.1  Sodium 136 - 145 mmol/L 139 139 141  Potassium 3.3 - 4.7 mmol/L 4.3 4.4 4.8  Chloride 98 - 109 mmol/L 104 - -  CO2 22 - 29 mmol/L '26 27 26  ' Calcium 8.4 - 10.4 mg/dL 9.4 10.0 9.7  Total Protein 6.4 - 8.3 g/dL 7.3 7.3 7.1  Total Bilirubin 0.2 - 1.2 mg/dL 0.5 0.38 0.30  Alkaline Phos 40 - 150 U/L 81 79 93  AST 5 - 34 U/L '22 20 18  ' ALT 0 - 55 U/L '12 12 14    ' Lab Results  Component Value Date   WBC 2.8 (L) 07/15/2017   HGB 12.3 07/16/2016   HCT 37.4 07/15/2017   MCV 92.8 07/15/2017   PLT 219 07/15/2017   NEUTROABS 1.4 (L) 07/15/2017    ASSESSMENT & PLAN:  Breast cancer of upper-outer quadrant of right female breast ([place]) Right breast cancer stage II ER/PR negative HER-2 positive status post neoadjuvant chemotherapy followed by lumpectomy and axillary dissection followed by radiation therapy and maintenance Herceptin  Breast Cancer Surveillance: 1. Breast exam 07/15/2017: Normal 2. Mammogram 08/19/2016 no abnormalities. Postsurgical changes. Breast Density Category C.   Breast discomfort and pain:this appears to be the same as before in the left superior chest wall  area. GERD Anxiety  Since the patient is retired, she spends most of the time with exercising and reading books and socializing. She is going on a cruise trip next week and is excited about that.  She started on a no sugar diet and is doing very well by controlling her carbohydrate intake.  RTC one year with long-term survivorship clinic   I spent 25 minutes talking to the patient of which more than half was spent in counseling and coordination of care.  No orders of the defined types were placed in this encounter.  The patient has a good understanding of the overall plan. she agrees with it. she will call with any problems that may develop before the next visit here.   Harriette Ohara, MD 07/15/17

## 2017-07-15 NOTE — Telephone Encounter (Signed)
Gave patient AVs and calendar of upcoming January 2020 appointments.  °

## 2017-07-16 ENCOUNTER — Other Ambulatory Visit: Payer: PRIVATE HEALTH INSURANCE

## 2017-07-16 ENCOUNTER — Ambulatory Visit: Payer: PRIVATE HEALTH INSURANCE | Admitting: Hematology and Oncology

## 2017-07-26 DIAGNOSIS — Z803 Family history of malignant neoplasm of breast: Secondary | ICD-10-CM | POA: Diagnosis not present

## 2017-07-26 DIAGNOSIS — Z801 Family history of malignant neoplasm of trachea, bronchus and lung: Secondary | ICD-10-CM | POA: Diagnosis not present

## 2017-07-26 DIAGNOSIS — Z01419 Encounter for gynecological examination (general) (routine) without abnormal findings: Secondary | ICD-10-CM | POA: Diagnosis not present

## 2017-07-26 DIAGNOSIS — Z800 Family history of malignant neoplasm of digestive organs: Secondary | ICD-10-CM | POA: Diagnosis not present

## 2017-07-26 DIAGNOSIS — Z6828 Body mass index (BMI) 28.0-28.9, adult: Secondary | ICD-10-CM | POA: Diagnosis not present

## 2017-07-26 DIAGNOSIS — Z808 Family history of malignant neoplasm of other organs or systems: Secondary | ICD-10-CM | POA: Diagnosis not present

## 2017-07-26 DIAGNOSIS — Z853 Personal history of malignant neoplasm of breast: Secondary | ICD-10-CM | POA: Diagnosis not present

## 2017-08-02 ENCOUNTER — Other Ambulatory Visit: Payer: Self-pay | Admitting: Hematology and Oncology

## 2017-08-02 DIAGNOSIS — Z9889 Other specified postprocedural states: Secondary | ICD-10-CM

## 2017-08-25 ENCOUNTER — Ambulatory Visit
Admission: RE | Admit: 2017-08-25 | Discharge: 2017-08-25 | Disposition: A | Discharge status: Home / Self Care | Payer: Medicare HMO | Source: Ambulatory Visit | Attending: Hematology and Oncology | Admitting: Hematology and Oncology

## 2017-08-25 DIAGNOSIS — Z9889 Other specified postprocedural states: Secondary | ICD-10-CM

## 2017-08-25 DIAGNOSIS — R928 Other abnormal and inconclusive findings on diagnostic imaging of breast: Secondary | ICD-10-CM | POA: Diagnosis not present

## 2017-08-25 HISTORY — DX: Personal history of irradiation: Z92.3

## 2017-08-25 HISTORY — DX: Personal history of antineoplastic chemotherapy: Z92.21

## 2017-09-15 DIAGNOSIS — Z809 Family history of malignant neoplasm, unspecified: Secondary | ICD-10-CM | POA: Diagnosis not present

## 2017-12-02 DIAGNOSIS — Z809 Family history of malignant neoplasm, unspecified: Secondary | ICD-10-CM | POA: Diagnosis not present

## 2017-12-02 DIAGNOSIS — K219 Gastro-esophageal reflux disease without esophagitis: Secondary | ICD-10-CM | POA: Diagnosis not present

## 2017-12-02 DIAGNOSIS — G4700 Insomnia, unspecified: Secondary | ICD-10-CM | POA: Diagnosis not present

## 2017-12-02 DIAGNOSIS — Z8249 Family history of ischemic heart disease and other diseases of the circulatory system: Secondary | ICD-10-CM | POA: Diagnosis not present

## 2017-12-02 DIAGNOSIS — E663 Overweight: Secondary | ICD-10-CM | POA: Diagnosis not present

## 2017-12-02 DIAGNOSIS — Z853 Personal history of malignant neoplasm of breast: Secondary | ICD-10-CM | POA: Diagnosis not present

## 2017-12-02 DIAGNOSIS — R69 Illness, unspecified: Secondary | ICD-10-CM | POA: Diagnosis not present

## 2017-12-10 ENCOUNTER — Other Ambulatory Visit: Payer: Self-pay | Admitting: Internal Medicine

## 2017-12-10 ENCOUNTER — Ambulatory Visit
Admission: RE | Admit: 2017-12-10 | Discharge: 2017-12-10 | Disposition: A | Discharge status: Home / Self Care | Payer: Medicare HMO | Source: Ambulatory Visit | Attending: Internal Medicine | Admitting: Internal Medicine

## 2017-12-10 DIAGNOSIS — Z1389 Encounter for screening for other disorder: Secondary | ICD-10-CM | POA: Diagnosis not present

## 2017-12-10 DIAGNOSIS — Z0000 Encounter for general adult medical examination without abnormal findings: Secondary | ICD-10-CM | POA: Diagnosis not present

## 2017-12-10 DIAGNOSIS — M25552 Pain in left hip: Secondary | ICD-10-CM

## 2017-12-10 DIAGNOSIS — K219 Gastro-esophageal reflux disease without esophagitis: Secondary | ICD-10-CM | POA: Diagnosis not present

## 2017-12-10 DIAGNOSIS — Z853 Personal history of malignant neoplasm of breast: Secondary | ICD-10-CM | POA: Diagnosis not present

## 2017-12-10 DIAGNOSIS — M8588 Other specified disorders of bone density and structure, other site: Secondary | ICD-10-CM | POA: Diagnosis not present

## 2017-12-28 DIAGNOSIS — M8588 Other specified disorders of bone density and structure, other site: Secondary | ICD-10-CM | POA: Diagnosis not present

## 2018-01-18 DIAGNOSIS — R69 Illness, unspecified: Secondary | ICD-10-CM | POA: Diagnosis not present

## 2018-01-19 DIAGNOSIS — C50911 Malignant neoplasm of unspecified site of right female breast: Secondary | ICD-10-CM | POA: Diagnosis not present

## 2018-02-23 DIAGNOSIS — E7800 Pure hypercholesterolemia, unspecified: Secondary | ICD-10-CM | POA: Diagnosis not present

## 2018-02-23 DIAGNOSIS — H524 Presbyopia: Secondary | ICD-10-CM | POA: Diagnosis not present

## 2018-02-23 DIAGNOSIS — Z0100 Encounter for examination of eyes and vision without abnormal findings: Secondary | ICD-10-CM | POA: Diagnosis not present

## 2018-07-14 ENCOUNTER — Telehealth: Payer: Self-pay | Admitting: Adult Health

## 2018-07-14 ENCOUNTER — Encounter: Payer: Self-pay | Admitting: Adult Health

## 2018-07-14 ENCOUNTER — Inpatient Hospital Stay: Payer: Medicare HMO | Attending: Adult Health | Admitting: Adult Health

## 2018-07-14 VITALS — BP 140/72 | HR 73 | Temp 97.5°F | Resp 18 | Ht 65.0 in | Wt 175.5 lb

## 2018-07-14 DIAGNOSIS — Z923 Personal history of irradiation: Secondary | ICD-10-CM | POA: Diagnosis not present

## 2018-07-14 DIAGNOSIS — Z9221 Personal history of antineoplastic chemotherapy: Secondary | ICD-10-CM | POA: Diagnosis not present

## 2018-07-14 DIAGNOSIS — Z853 Personal history of malignant neoplasm of breast: Secondary | ICD-10-CM | POA: Diagnosis not present

## 2018-07-14 DIAGNOSIS — C50411 Malignant neoplasm of upper-outer quadrant of right female breast: Secondary | ICD-10-CM

## 2018-07-14 DIAGNOSIS — Z79899 Other long term (current) drug therapy: Secondary | ICD-10-CM | POA: Insufficient documentation

## 2018-07-14 DIAGNOSIS — Z171 Estrogen receptor negative status [ER-]: Secondary | ICD-10-CM | POA: Diagnosis not present

## 2018-07-14 NOTE — Telephone Encounter (Signed)
Gave avs and calendar ° °

## 2018-07-14 NOTE — Progress Notes (Signed)
CLINIC:  Survivorship   REASON FOR VISIT:  Routine follow-up for history of breast cancer.   BRIEF ONCOLOGIC HISTORY:    Breast cancer of upper-outer quadrant of right female breast ([place])   06/05/2010 Initial Diagnosis    Breast cancer, IDC, right, Stage II, ER/PR negative, Her2neu positive    06/29/2010 - 10/07/2010 Neo-Adjuvant Chemotherapy    TCH chemotherapy    11/12/2010 Surgery    Right breast lumpectomy with axillary lymph node dissection revealing microscopic residual disease IDC grade 2; 18 lymph nodes negative    12/29/2010 - 02/02/2011 Radiation Therapy    Radiation therapy to lumpectomy site     - 09/17/2011 Chemotherapy    Herceptin maintenance for one year completed 09/17/2011      INTERVAL HISTORY:  [name] presents to the [facility] today for routine follow-up for her history of breast cancer.  Overall, she reports feeling quite well. Guynell has had a good year.  She was previously having chest palpitations, but since discontinuing some medications, they have improved.  She had a mammogram in 07/2017 that showed no evidence of malignancy and breast density category b.    She sees Dr. Delfina Redwood, her PCP regularly.  She is up to date with her colon cancer screening.  She has this done every 5 years.  Her PCP follows her bone density testing.  She is exercising regularly.  She has gyn visits and exams regularly.  She does not have skin checks.        REVIEW OF SYSTEMS:  Review of Systems  Constitutional: Negative for appetite change, chills, fatigue, fever and unexpected weight change.  HENT:   Negative for hearing loss, lump/mass, mouth sores and trouble swallowing.   Eyes: Negative for eye problems and icterus.  Respiratory: Negative for chest tightness, cough and shortness of breath.   Cardiovascular: Negative for chest pain, leg swelling and palpitations.  Gastrointestinal: Negative for abdominal distention, abdominal pain, constipation, diarrhea, nausea and  vomiting.  Endocrine: Negative for hot flashes.  Musculoskeletal: Negative for arthralgias.  Skin: Negative for itching and rash.  Neurological: Negative for dizziness, extremity weakness, headaches and numbness.  Hematological: Negative for adenopathy. Does not bruise/bleed easily.  Psychiatric/Behavioral: Negative for depression. The patient is not nervous/anxious.   Breast: Denies any new nodularity, masses, tenderness, nipple changes, or nipple discharge.       PAST MEDICAL/SURGICAL HISTORY:  Past Medical History:  Diagnosis Date  . Anxiety   . Breast cancer ([place])   . GERD (gastroesophageal reflux disease)   . GERD (gastroesophageal reflux disease) 12/18/2011  . Hx Breast cancer, IDC, Right, Stage II, Receptor -, Her 2 + 06/05/2010  . Neuromuscular disorder ([place])    tingling/burning in feet - tx gabapentin  . Palpitations    History r/t anxiety on LORazepam - no problems since meds  . Personal history of chemotherapy   . Personal history of radiation therapy   . Rash 06/04/2011   history  . SVD (spontaneous vaginal delivery)    x 4   Past Surgical History:  Procedure Laterality Date  . [place] STUDY  01/18/2012   Procedure: [place] STUDY;  Surgeon: Garlan Fair, MD;  Location: [facility];  Service: Endoscopy;  Laterality: N/A;  . ANTERIOR AND POSTERIOR REPAIR N/A 12/18/2013   Procedure: ANTERIOR (CYSTOCELE) ;  Surgeon: Marylynn Pearson, MD;  Location: [facility];  Service: Gynecology;  Laterality: N/A;  . BLADDER SURGERY    . BREAST LUMPECTOMY Right 2012  with lymph nodes  . BREAST SURGERY    . ESOPHAGEAL MANOMETRY  01/18/2012   Procedure: ESOPHAGEAL MANOMETRY (EM);  Surgeon: Martin K Johnson, MD;  Location: [facility];  Service: Endoscopy;  Laterality: N/A;  . FOOT SURGERY     bilateral bunions removed  . LAPAROSCOPIC ASSISTED VAGINAL HYSTERECTOMY Bilateral 12/18/2013   Procedure: LAPAROSCOPIC ASSISTED VAGINAL HYSTERECTOMY WITH BILATERAL SALPINGO OOPHORECTOMY;   Surgeon: Gretchen Adkins, MD;  Location: [facility];  Service: Gynecology;  Laterality: Bilateral;  . MASTECTOMY PARTIAL / LUMPECTOMY W/ AXILLARY LYMPHADENECTOMY  01/06/2011   Right- Dr Streck  . PORT-A-CATH REMOVAL  10/12/2011   Procedure: REMOVAL PORT-A-CATH;  Surgeon: Christian J Streck, MD;  Location: [facility];  Service: General;  Laterality: Right;  . PORTACATH PLACEMENT  06/17/2010  . TUBAL LIGATION       ALLERGIES:  Allergies  Allergen Reactions  . Compazine      CURRENT MEDICATIONS:  Outpatient Encounter Medications as of 07/14/2018  Medication Sig Note  . B Complex Vitamins (VITAMIN B-COMPLEX PO) Take 1 tablet by mouth daily.    . calcium-vitamin D (OSCAL WITH D) 500-200 MG-UNIT per tablet Take 2 tablets by mouth daily with breakfast.   . Cholecalciferol (VITAMIN D3) 1000 UNITS CAPS Take 1 capsule by mouth daily.  10/27/2012: Ran out  . polyvinyl alcohol (LIQUID TEARS) 1.4 % ophthalmic solution Place 1 drop into both eyes daily as needed for dry eyes.    . [DISCONTINUED] pantoprazole (PROTONIX) 40 MG tablet Take 40 mg by mouth daily.   . [DISCONTINUED] traZODone (DESYREL) 100 MG tablet Take 1 tablet (100 mg total) by mouth at bedtime. (Patient not taking: Reported on 07/14/2018)    No facility-administered encounter medications on file as of 07/14/2018.      ONCOLOGIC FAMILY HISTORY:  Family History  Problem Relation Age of Onset  . Cancer Father 65       ? type    GENETIC COUNSELING/TESTING: Not at this time  SOCIAL HISTORY:  Social History   Socioeconomic History  . Marital status: Widowed    Spouse name: Not on file  . Number of children: 4  . Years of education: Not on file  . Highest education level: Not on file  Occupational History  . Occupation: Retired-customer service  Social Needs  . Financial resource strain: Not on file  . Food insecurity:    Worry: Not on file    Inability: Not on file  . Transportation needs:    Medical: Not on  file    Non-medical: Not on file  Tobacco Use  . Smoking status: Never Smoker  . Smokeless tobacco: Never Used  Substance and Sexual Activity  . Alcohol use: No  . Drug use: No  . Sexual activity: Not Currently    Birth control/protection: Post-menopausal  Lifestyle  . Physical activity:    Days per week: Not on file    Minutes per session: Not on file  . Stress: Not on file  Relationships  . Social connections:    Talks on phone: Not on file    Gets together: Not on file    Attends religious service: Not on file    Active member of club or organization: Not on file    Attends meetings of clubs or organizations: Not on file    Relationship status: Not on file  . Intimate partner violence:    Fear of current or ex partner: Not on file    Emotionally abused: Not on   file    Physically abused: Not on file    Forced sexual activity: Not on file  Other Topics Concern  . Not on file  Social History Narrative  . Not on file      PHYSICAL EXAMINATION:  Vital Signs: Vitals:   07/14/18 1048  BP: 140/72  Pulse: 73  Resp: 18  Temp: (!) 97.5 F (36.4 C)  SpO2: 100%   Filed Weights   07/14/18 1048  Weight: 175 lb 8 oz (79.6 kg)   General: Well-nourished, well-appearing female in no acute distress.  Unaccompanied today.   HEENT: Head is normocephalic.  Pupils equal and reactive to light. Conjunctivae clear without exudate.  Sclerae anicteric. Oral mucosa is pink, moist.  Oropharynx is pink without lesions or erythema.  Lymph: No cervical, supraclavicular, or infraclavicular lymphadenopathy noted on palpation.  Cardiovascular: Regular rate and rhythm.Marland Kitchen Respiratory: Clear to auscultation bilaterally. Chest expansion symmetric; breathing non-labored.  Breast Exam:  -Left breast: No appreciable masses on palpation. No skin redness, thickening, or peau d'orange appearance; no nipple retraction or nipple discharge.  -Right breast: No appreciable masses on palpation. No skin  redness, thickening, or peau d'orange appearance; no nipple retraction or nipple discharge; mild distortion in symmetry at previous lumpectomy site well healed scar without erythema or nodularity. -Axilla: No axillary adenopathy bilaterally.  GI: Abdomen soft and round; non-tender, non-distended. Bowel sounds normoactive. No hepatosplenomegaly.   GU: Deferred.  Neuro: No focal deficits. Steady gait.  Psych: Mood and affect normal and appropriate for situation.  MSK: No focal spinal tenderness to palpation, full range of motion in bilateral upper extremities Extremities: No edema. Skin: Warm and dry.  LABORATORY DATA:  None for this visit   DIAGNOSTIC IMAGING:  Most recent mammogram:      ASSESSMENT AND PLAN:  Ms.. Laurie Allen is a pleasant 73 y.o. 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  She [facility] for surveillance and routine follow-up.   1. History of breast cancer:  Ms. Sawka is currently clinically and radiographically without evidence of disease or recurrence of breast cancer.  She will continue to undergo annual mammograms, next due in 07/2018.   She return annually for evaluation and she will come in 14 months (after her next mammogram) for continued LTS follow up.  I encouraged her to call me with any questions or concerns before her next visit at the cancer center, and I would be happy to see her sooner, if needed.    2. Bone health:  Given Ms. Michelin's age, history of breast cancer, she is at risk for bone demineralization.  She sees her PCP and has undergone bone density testing with them.  They are managing her bone density and she tells me she regularly does weight bearing exercises to help her bones.  She was given education on specific food and activities to  promote bone health.  3. Cancer screening:  Due to Ms. Plaia's history and her age, she should receive screening for skin cancers, colon cancer, and gynecologic cancers. She was encouraged to follow-up with her PCP for appropriate cancer screenings.   4. Health maintenance and wellness promotion: Ms. Crandle was encouraged to consume 5-7 servings of fruits and vegetables per day. She was also encouraged to engage in moderate to vigorous exercise for 30 minutes per day most days of the week. She was  instructed to limit her alcohol consumption and continue to abstain from tobacco use.     Dispo:  -Return to cancer center in March, 2021 for LTS follow up -Mammogram due in 07/2018 and 07/2019   A total of (30) minutes of face-to-face time was spent with this patient with greater than 50% of that time in counseling and care-coordination.   Lindsey Cornetto Causey, NP [facility] [phone]   Note: PRIMARY CARE PROVIDER Polite, Ronald, MD [phone]  

## 2018-07-14 NOTE — Patient Instructions (Signed)

## 2018-08-03 ENCOUNTER — Other Ambulatory Visit: Payer: Self-pay | Admitting: Hematology and Oncology

## 2018-08-03 DIAGNOSIS — Z1231 Encounter for screening mammogram for malignant neoplasm of breast: Secondary | ICD-10-CM

## 2018-08-16 DIAGNOSIS — R69 Illness, unspecified: Secondary | ICD-10-CM | POA: Diagnosis not present

## 2018-08-29 ENCOUNTER — Ambulatory Visit
Admission: RE | Admit: 2018-08-29 | Discharge: 2018-08-29 | Disposition: A | Discharge status: Home / Self Care | Payer: Medicare HMO | Source: Ambulatory Visit | Attending: Hematology and Oncology | Admitting: Hematology and Oncology

## 2018-08-29 DIAGNOSIS — Z1231 Encounter for screening mammogram for malignant neoplasm of breast: Secondary | ICD-10-CM | POA: Diagnosis not present

## 2018-10-26 DIAGNOSIS — Z01419 Encounter for gynecological examination (general) (routine) without abnormal findings: Secondary | ICD-10-CM | POA: Diagnosis not present

## 2018-10-26 DIAGNOSIS — N3945 Continuous leakage: Secondary | ICD-10-CM | POA: Diagnosis not present

## 2018-10-26 DIAGNOSIS — Z6828 Body mass index (BMI) 28.0-28.9, adult: Secondary | ICD-10-CM | POA: Diagnosis not present

## 2019-02-01 ENCOUNTER — Other Ambulatory Visit: Payer: Self-pay

## 2019-02-01 DIAGNOSIS — Z20822 Contact with and (suspected) exposure to covid-19: Secondary | ICD-10-CM

## 2019-02-02 LAB — NOVEL CORONAVIRUS, NAA: SARS-CoV-2, NAA: NOT DETECTED

## 2019-03-01 ENCOUNTER — Other Ambulatory Visit: Payer: Self-pay

## 2019-03-01 DIAGNOSIS — Z20822 Contact with and (suspected) exposure to covid-19: Secondary | ICD-10-CM

## 2019-03-02 LAB — NOVEL CORONAVIRUS, NAA: SARS-CoV-2, NAA: NOT DETECTED

## 2019-03-24 DIAGNOSIS — H52223 Regular astigmatism, bilateral: Secondary | ICD-10-CM | POA: Diagnosis not present

## 2019-05-15 DIAGNOSIS — E7800 Pure hypercholesterolemia, unspecified: Secondary | ICD-10-CM | POA: Diagnosis not present

## 2019-05-15 DIAGNOSIS — Z1389 Encounter for screening for other disorder: Secondary | ICD-10-CM | POA: Diagnosis not present

## 2019-05-15 DIAGNOSIS — R140 Abdominal distension (gaseous): Secondary | ICD-10-CM | POA: Diagnosis not present

## 2019-05-15 DIAGNOSIS — R69 Illness, unspecified: Secondary | ICD-10-CM | POA: Diagnosis not present

## 2019-05-15 DIAGNOSIS — Z853 Personal history of malignant neoplasm of breast: Secondary | ICD-10-CM | POA: Diagnosis not present

## 2019-05-15 DIAGNOSIS — Z0000 Encounter for general adult medical examination without abnormal findings: Secondary | ICD-10-CM | POA: Diagnosis not present

## 2019-05-15 DIAGNOSIS — R8290 Unspecified abnormal findings in urine: Secondary | ICD-10-CM | POA: Diagnosis not present

## 2019-05-18 DIAGNOSIS — R69 Illness, unspecified: Secondary | ICD-10-CM | POA: Diagnosis not present

## 2019-06-28 DIAGNOSIS — Z0100 Encounter for examination of eyes and vision without abnormal findings: Secondary | ICD-10-CM | POA: Diagnosis not present

## 2019-07-04 ENCOUNTER — Other Ambulatory Visit: Payer: Self-pay

## 2019-07-04 DIAGNOSIS — Z20822 Contact with and (suspected) exposure to covid-19: Secondary | ICD-10-CM | POA: Diagnosis not present

## 2019-07-05 LAB — NOVEL CORONAVIRUS, NAA: SARS-CoV-2, NAA: NOT DETECTED

## 2019-08-08 ENCOUNTER — Other Ambulatory Visit: Payer: Self-pay | Admitting: Hematology and Oncology

## 2019-08-08 DIAGNOSIS — Z1231 Encounter for screening mammogram for malignant neoplasm of breast: Secondary | ICD-10-CM

## 2019-08-14 ENCOUNTER — Telehealth: Payer: Self-pay | Admitting: Adult Health

## 2019-08-14 NOTE — Telephone Encounter (Signed)
Rescheduled per provider. Called and spoke with pt,confirmed 3/26 appt

## 2019-09-04 ENCOUNTER — Encounter: Payer: Medicare HMO | Admitting: Adult Health

## 2019-09-18 ENCOUNTER — Other Ambulatory Visit: Payer: Self-pay

## 2019-09-18 ENCOUNTER — Ambulatory Visit
Admission: RE | Admit: 2019-09-18 | Discharge: 2019-09-18 | Disposition: A | Discharge status: Home / Self Care | Payer: Medicare HMO | Source: Ambulatory Visit | Attending: Hematology and Oncology | Admitting: Hematology and Oncology

## 2019-09-18 DIAGNOSIS — Z1231 Encounter for screening mammogram for malignant neoplasm of breast: Secondary | ICD-10-CM | POA: Diagnosis not present

## 2019-09-22 ENCOUNTER — Inpatient Hospital Stay: Payer: Medicare HMO | Attending: Adult Health | Admitting: Adult Health

## 2019-09-22 ENCOUNTER — Other Ambulatory Visit: Payer: Self-pay

## 2019-09-22 ENCOUNTER — Encounter: Payer: Self-pay | Admitting: Adult Health

## 2019-09-22 VITALS — BP 141/72 | HR 76 | Temp 98.2°F | Resp 18 | Ht 65.0 in | Wt 179.6 lb

## 2019-09-22 DIAGNOSIS — Z171 Estrogen receptor negative status [ER-]: Secondary | ICD-10-CM

## 2019-09-22 DIAGNOSIS — C50411 Malignant neoplasm of upper-outer quadrant of right female breast: Secondary | ICD-10-CM

## 2019-09-22 DIAGNOSIS — Z853 Personal history of malignant neoplasm of breast: Secondary | ICD-10-CM | POA: Insufficient documentation

## 2019-09-22 NOTE — Progress Notes (Signed)
CLINIC:  Survivorship   REASON FOR VISIT:  Routine follow-up for history of breast cancer.   BRIEF ONCOLOGIC HISTORY:  Oncology History  Breast cancer of upper-outer quadrant of right female breast ([place])  06/05/2010 Initial Diagnosis   Breast cancer, IDC, right, Stage II, ER/PR negative, Her2neu positive   06/29/2010 - 10/07/2010 Neo-Adjuvant Chemotherapy   TCH chemotherapy   11/12/2010 Surgery   Right breast lumpectomy with axillary lymph node dissection revealing microscopic residual disease IDC grade 2; 18 lymph nodes negative   12/29/2010 - 02/02/2011 Radiation Therapy   Radiation therapy to lumpectomy site    - 09/17/2011 Chemotherapy   Herceptin maintenance for one year completed 09/17/2011      INTERVAL HISTORY:  Ms. Matlin presents to the [facility] today for routine follow-up for her history of breast cancer.  Overall, she reports feeling quite well. Takeila has had a good year.   Celestia's most recent mammogram was completed in 08/2019 and showed no mammographic evidence of malignancy and breast density category B.  Emelyn is up to date with her PCP visits, and sees Dr. Julien Girt in GYN and also is up to date with colon cancer screening.  She is exercising regularly, however it has decreased this past year considering the [place] pandemic.     REVIEW OF SYSTEMS:  Review of Systems  Constitutional: Negative for appetite change, chills, fatigue, fever and unexpected weight change.  HENT:   Negative for hearing loss, lump/mass, mouth sores and trouble swallowing.   Eyes: Negative for eye problems and icterus.  Respiratory: Negative for chest tightness, cough and shortness of breath.   Cardiovascular: Negative for chest pain, leg swelling and palpitations.  Gastrointestinal: Negative for abdominal distention, abdominal pain, constipation, diarrhea, nausea and vomiting.  Endocrine: Negative for hot flashes.  Musculoskeletal: Negative for arthralgias.  Skin: Negative for itching  and rash.  Neurological: Negative for dizziness, extremity weakness, headaches and numbness.  Hematological: Negative for adenopathy. Does not bruise/bleed easily.  Psychiatric/Behavioral: Negative for depression. The patient is not nervous/anxious.   Breast: Denies any new nodularity, masses, tenderness, nipple changes, or nipple discharge.       PAST MEDICAL/SURGICAL HISTORY:  Past Medical History:  Diagnosis Date  . Anxiety   . Breast cancer ([place])   . GERD (gastroesophageal reflux disease)   . GERD (gastroesophageal reflux disease) 12/18/2011  . Hx Breast cancer, IDC, Right, Stage II, Receptor -, Her 2 + 06/05/2010  . Neuromuscular disorder ([place])    tingling/burning in feet - tx gabapentin  . Palpitations    History r/t anxiety on LORazepam - no problems since meds  . Personal history of chemotherapy   . Personal history of radiation therapy   . Rash 06/04/2011   history  . SVD (spontaneous vaginal delivery)    x 4   Past Surgical History:  Procedure Laterality Date  . [place] STUDY  01/18/2012   Procedure: [place] STUDY;  Surgeon: Garlan Fair, MD;  [facility];  Service: Endoscopy;  Laterality: N/A;  . ANTERIOR AND POSTERIOR REPAIR N/A 12/18/2013   Procedure: ANTERIOR (CYSTOCELE) ;  Surgeon: Marylynn Pearson, MD;  Location: [facility];  Service: Gynecology;  Laterality: N/A;  . BLADDER SURGERY    . BREAST LUMPECTOMY Right 2012   with lymph nodes  . BREAST SURGERY    . ESOPHAGEAL MANOMETRY  01/18/2012   Procedure: ESOPHAGEAL MANOMETRY (EM);  Surgeon: Garlan Fair, MD;  [facility];  Service: Endoscopy;  Laterality:  N/A;  . FOOT SURGERY     bilateral bunions removed  . LAPAROSCOPIC ASSISTED VAGINAL HYSTERECTOMY Bilateral 12/18/2013   Procedure: LAPAROSCOPIC ASSISTED VAGINAL HYSTERECTOMY WITH BILATERAL SALPINGO OOPHORECTOMY;  Surgeon: Marylynn Pearson, MD;  Location: [facility];  Service: Gynecology;  Laterality: Bilateral;  . MASTECTOMY PARTIAL /  LUMPECTOMY W/ AXILLARY LYMPHADENECTOMY  01/06/2011   Right- Dr Margot Chimes  . PORT-A-CATH REMOVAL  10/12/2011   Procedure: REMOVAL PORT-A-CATH;  Surgeon: Haywood Lasso, MD;  Location: [facility];  Service: General;  Laterality: Right;  . PORTACATH PLACEMENT  06/17/2010  . TUBAL LIGATION       ALLERGIES:  Allergies  Allergen Reactions  . Compazine      CURRENT MEDICATIONS:  Outpatient Encounter Medications as of 09/22/2019  Medication Sig Note  . Ascorbic Acid (VITAMIN C) 1000 MG tablet Take 1,000 mg by mouth daily.   . B Complex Vitamins (VITAMIN B-COMPLEX PO) Take 1 tablet by mouth daily.    . calcium-vitamin D (OSCAL WITH D) 500-200 MG-UNIT per tablet Take 2 tablets by mouth daily with breakfast.   . Cholecalciferol (VITAMIN D3) 1000 UNITS CAPS Take 1 capsule by mouth daily.  10/27/2012: Ran out  . polyvinyl alcohol (LIQUID TEARS) 1.4 % ophthalmic solution Place 1 drop into both eyes daily as needed for dry eyes.     No facility-administered encounter medications on file as of 09/22/2019.     ONCOLOGIC FAMILY HISTORY:  Family History  Problem Relation Age of Onset  . Cancer Father 52       ? type    GENETIC COUNSELING/TESTING: Completed with Dr. Orvan Seen  SOCIAL HISTORY:  Social History   Socioeconomic History  . Marital status: Widowed    Spouse name: Not on file  . Number of children: 4  . Years of education: Not on file  . Highest education level: Not on file  Occupational History  . Occupation: Retired-customer service  Tobacco Use  . Smoking status: Never Smoker  . Smokeless tobacco: Never Used  Substance and Sexual Activity  . Alcohol use: No  . Drug use: No  . Sexual activity: Not Currently    Birth control/protection: Post-menopausal  Other Topics Concern  . Not on file  Social History Narrative  . Not on file   Social Determinants of Health   Financial Resource Strain:   . Difficulty of Paying Living Expenses:   Food Insecurity:    . Worried About Charity fundraiser in the Last Year:   . Arboriculturist in the Last Year:   Transportation Needs:   . Film/video editor (Medical):   Marland Kitchen Lack of Transportation (Non-Medical):   Physical Activity:   . Days of Exercise per Week:   . Minutes of Exercise per Session:   Stress:   . Feeling of Stress :   Social Connections:   . Frequency of Communication with Friends and Family:   . Frequency of Social Gatherings with Friends and Family:   . Attends Religious Services:   . Active Member of Clubs or Organizations:   . Attends Archivist Meetings:   Marland Kitchen Marital Status:   Intimate Partner Violence:   . Fear of Current or Ex-Partner:   . Emotionally Abused:   Marland Kitchen Physically Abused:   . Sexually Abused:       PHYSICAL EXAMINATION:  Vital Signs: Vitals:   09/22/19 1107  BP: (!) 141/72  Pulse: 76  Resp: 18  Temp: 98.2 F (36.8 C)  SpO2: 100%   Filed Weights   09/22/19 1107  Weight: 179 lb 9.6 oz (81.5 kg)   General: Well-nourished, well-appearing female in no acute distress.  Unaccompanied today.   HEENT: Head is normocephalic.  Pupils equal and reactive to light. Conjunctivae clear without exudate.  Sclerae anicteric. Oral mucosa is pink, moist.  Oropharynx is pink without lesions or erythema.  Lymph: No cervical, supraclavicular, or infraclavicular lymphadenopathy noted on palpation.  Cardiovascular: Regular rate and rhythm.Marland Kitchen Respiratory: Clear to auscultation bilaterally. Chest expansion symmetric; breathing non-labored.  Breast Exam:  -Left breast: No appreciable masses on palpation. No skin redness, thickening, or peau d'orange appearance; no nipple retraction or nipple discharge.  -Right breast: No appreciable masses on palpation. No skin redness, thickening, or peau d'orange appearance; no nipple retraction or nipple discharge; mild distortion in symmetry at previous lumpectomy site well healed scar without erythema or nodularity. -Axilla: No  axillary adenopathy bilaterally.  GI: Abdomen soft and round; non-tender, non-distended. Bowel sounds normoactive. No hepatosplenomegaly.   GU: Deferred.  Neuro: No focal deficits. Steady gait.  Psych: Mood and affect normal and appropriate for situation.  MSK: No focal spinal tenderness to palpation, full range of motion in bilateral upper extremities Extremities: No edema. Skin: Warm and dry.  LABORATORY DATA:  None for this visit   DIAGNOSTIC IMAGING:  Most recent mammogram:  CLINICAL DATA:  Screening.  EXAM: DIGITAL SCREENING BILATERAL MAMMOGRAM WITH TOMO AND CAD  COMPARISON:  Previous exam(s).  ACR Breast Density Category b: There are scattered areas of fibroglandular density.  FINDINGS: There are no findings suspicious for malignancy. Images were processed with CAD.  IMPRESSION: No mammographic evidence of malignancy. A result letter of this screening mammogram will be mailed directly to the patient.  RECOMMENDATION: Screening mammogram in one year. (Code:SM-B-01Y)  BI-RADS CATEGORY  1: Negative.   Electronically Signed   By: Kristopher Oppenheim M.D.   On: 09/18/2019 16:20    ASSESSMENT AND PLAN:  Ms.. Ricotta is a pleasant 74 y.o. 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  She presents to the [facility] for surveillance and routine follow-up.   1. History of breast cancer:  Ms. Parks is currently clinically and radiographically without evidence of disease or recurrence of breast cancer.  She will continue to undergo annual mammograms, next due in 08/2019.   She will return in one year for LTS follow up.  I completed her SCP today and gave to her.    I encouraged her to call me with any questions or concerns before her next visit at the cancer center, and I would  be happy to see her sooner, if needed.    2. Bone health:  Given Ms. Callejo's age, history of breast cancer, she is at risk for bone demineralization.  She sees her PCP and has undergone bone density testing with them.  She cannot recall when her last bone density was completed.  They are managing her bone density and she tells me she regularly does weight bearing exercises to help her bones.  She was given education on specific food and activities to promote bone health.  3. Cancer screening:  Due to Ms. Narvaez's history and her age, she should receive screening for skin cancers, colon cancer, and gynecologic cancers. She was encouraged to follow-up with her PCP for appropriate cancer screenings.   4. Health maintenance  and wellness promotion: Ms. Cobaugh was encouraged to consume 5-7 servings of fruits and vegetables per day. She was also encouraged to engage in moderate to vigorous exercise for 30 minutes per day most days of the week. She was instructed to limit her alcohol consumption and continue to abstain from tobacco use.     Dispo:  -Return to cancer center in March, 2022 for LTS follow up -Mammogram due in 08/2020  Total encounter time: 30 minutes*  Wilber Bihari, NP 09/22/19 11:13 AM Medical Oncology and Hematology [facility] [place], [place] [address] Tel. [phone]    Fax. [phone]  *Total Encounter Time as defined by the Centers for Medicare and Medicaid Services includes, in addition to the face-to-face time of a patient visit (documented in the note above) non-face-to-face time: obtaining and reviewing outside history, ordering and reviewing medications, tests or procedures, care coordination (communications with other health care professionals or caregivers) and documentation in the medical record.   Note: PRIMARY CARE PROVIDER [place], [place] [phone]

## 2019-09-25 ENCOUNTER — Telehealth: Payer: Self-pay | Admitting: Adult Health

## 2019-09-25 NOTE — Telephone Encounter (Signed)
No 3/26 los. No changes made to pt schedule.  

## 2019-11-07 DIAGNOSIS — Z6829 Body mass index (BMI) 29.0-29.9, adult: Secondary | ICD-10-CM | POA: Diagnosis not present

## 2019-11-07 DIAGNOSIS — Z124 Encounter for screening for malignant neoplasm of cervix: Secondary | ICD-10-CM | POA: Diagnosis not present

## 2020-01-06 ENCOUNTER — Encounter (HOSPITAL_COMMUNITY): Payer: Self-pay

## 2020-01-06 ENCOUNTER — Ambulatory Visit (HOSPITAL_COMMUNITY)
Admission: EM | Admit: 2020-01-06 | Discharge: 2020-01-06 | Disposition: A | Discharge status: Home / Self Care | Payer: Medicare HMO | Attending: Emergency Medicine | Admitting: Emergency Medicine

## 2020-01-06 ENCOUNTER — Other Ambulatory Visit: Payer: Self-pay

## 2020-01-06 DIAGNOSIS — W57XXXA Bitten or stung by nonvenomous insect and other nonvenomous arthropods, initial encounter: Secondary | ICD-10-CM | POA: Diagnosis not present

## 2020-01-06 DIAGNOSIS — T63441A Toxic effect of venom of bees, accidental (unintentional), initial encounter: Secondary | ICD-10-CM | POA: Diagnosis not present

## 2020-01-06 MED ORDER — PREDNISONE 10 MG (21) PO TBPK: ORAL_TABLET | Freq: Every day | ORAL | 0 refills | Status: DC

## 2020-01-06 MED ORDER — CEPHALEXIN 500 MG PO CAPS: 500.0000 mg | ORAL_CAPSULE | Freq: Four times a day (QID) | ORAL | 0 refills | Status: AC

## 2020-01-06 NOTE — Discharge Instructions (Addendum)
Wash blistered area daily with soap and water, keep covered to keep clean. The skin will eventually slough off.  Complete course of steroids as well as antibiotics provided.  Continue with antihistamine- either benadryl every 6 hours or daily zyrtec or claritin.  Please return for any worsening of redness, pain, fevers, pus drainage from hand.

## 2020-01-06 NOTE — ED Provider Notes (Signed)
[facility]    CSN: [id number] Arrival date & time: 01/06/20  1000      History   Chief Complaint Chief Complaint  Patient presents with  . Insect Bite    HPI Laurie Allen is a 74 y.o. female.   Laurie Allen presents with complaints of bilateral hand swelling, itching and pain s/p bee sting to each hand, two days ago. Woke this morning also with large blister to dorsal aspect of right thumb and redness extending of right forearm. She was working in the yard and visualized the bees, confirming she had a sting to each hand. Took benadryl this morning which has not helped. No facial or oral involvement, no shortness of breath. No fevers.   ROS per HPI, negative if not otherwise mentioned.      Past Medical History:  Diagnosis Date  . Anxiety   . Breast cancer ([place])   . GERD (gastroesophageal reflux disease)   . GERD (gastroesophageal reflux disease) 12/18/2011  . Hx Breast cancer, IDC, Right, Stage II, Receptor -, Her 2 + 06/05/2010  . Neuromuscular disorder ([place])    tingling/burning in feet - tx gabapentin  . Palpitations    History r/t anxiety on LORazepam - no problems since meds  . Personal history of chemotherapy   . Personal history of radiation therapy   . Rash 06/04/2011   history  . SVD (spontaneous vaginal delivery)    x 4    Patient Active Problem List   Diagnosis Date Noted  . Anxiety disorder 04/19/2014  . Prolapse of female pelvic organs 12/18/2013  . GERD (gastroesophageal reflux disease) 12/18/2011  . Rash 06/04/2011  . Palpitations 02/24/2011  . HTN (hypertension) 02/24/2011  . Breast cancer of upper-outer quadrant of right female breast ([place]) 06/05/2010    Past Surgical History:  Procedure Laterality Date  . [place] STUDY  01/18/2012   Procedure: [place] STUDY;  Surgeon: Garlan Fair, MD;  Location: [facility];  Service: Endoscopy;  Laterality: N/A;  . ANTERIOR AND POSTERIOR REPAIR N/A 12/18/2013   Procedure: ANTERIOR  (CYSTOCELE) ;  Surgeon: Marylynn Pearson, MD;  Location: [facility];  Service: Gynecology;  Laterality: N/A;  . BLADDER SURGERY    . BREAST LUMPECTOMY Right 2012   with lymph nodes  . BREAST SURGERY    . ESOPHAGEAL MANOMETRY  01/18/2012   Procedure: ESOPHAGEAL MANOMETRY (EM);  Surgeon: Garlan Fair, MD;  Location: [facility];  Service: Endoscopy;  Laterality: N/A;  . FOOT SURGERY     bilateral bunions removed  . LAPAROSCOPIC ASSISTED VAGINAL HYSTERECTOMY Bilateral 12/18/2013   Procedure: LAPAROSCOPIC ASSISTED VAGINAL HYSTERECTOMY WITH BILATERAL SALPINGO OOPHORECTOMY;  Surgeon: Marylynn Pearson, MD;  Location: [facility];  Service: Gynecology;  Laterality: Bilateral;  . MASTECTOMY PARTIAL / LUMPECTOMY W/ AXILLARY LYMPHADENECTOMY  01/06/2011   Right- Dr Margot Chimes  . PORT-A-CATH REMOVAL  10/12/2011   Procedure: REMOVAL PORT-A-CATH;  Surgeon: Haywood Lasso, MD;  Location: [facility];  Service: General;  Laterality: Right;  . PORTACATH PLACEMENT  06/17/2010  . TUBAL LIGATION      OB History   No obstetric history on file.      Home Medications    Prior to Admission medications   Medication Sig Start Date End Date Taking? Authorizing Provider  Ascorbic Acid (VITAMIN C) 1000 MG tablet Take 1,000 mg by mouth daily.   Yes [provider]  B Complex Vitamins (VITAMIN B-COMPLEX PO) Take 1 tablet by  mouth daily.    Yes [provider]  calcium-vitamin D (OSCAL WITH D) 500-200 MG-UNIT per tablet Take 2 tablets by mouth daily with breakfast.   Yes [provider]  Cholecalciferol (VITAMIN D3) 1000 UNITS CAPS Take 1 capsule by mouth daily.    Yes [provider]  cephALEXin (KEFLEX) 500 MG capsule Take 1 capsule (500 mg total) by mouth 4 (four) times daily for 7 days. 01/06/20 01/13/20  Zigmund Gottron, NP  polyvinyl alcohol (LIQUID TEARS) 1.4 % ophthalmic solution Place 1 drop into both eyes daily as needed for dry eyes.     [provider]    predniSONE (STERAPRED UNI-PAK 21 TAB) 10 MG (21) TBPK tablet Take by mouth daily. Per box instruction 01/06/20   Zigmund Gottron, NP    Family History Family History  Problem Relation Age of Onset  . Cancer Father 25       ? type    Social History Social History   Tobacco Use  . Smoking status: Never Smoker  . Smokeless tobacco: Never Used  Substance Use Topics  . Alcohol use: No  . Drug use: No     Allergies   Compazine   Review of Systems Review of Systems   Physical Exam Triage Vital Signs ED Triage Vitals  Enc Vitals Group     BP 01/06/20 1014 115/68     Pulse Rate 01/06/20 1014 79     Resp 01/06/20 1014 17     Temp 01/06/20 1014 98.2 F (36.8 C)     Temp Source 01/06/20 1014 Oral     SpO2 01/06/20 1014 97 %     Weight --      Height --      Head Circumference --      Peak Flow --      Pain Score 01/06/20 1015 0     Pain Loc --      Pain Edu? --      Excl. in [place]? --    No data found.  Updated Vital Signs BP 115/68 (BP Location: Left Arm)   Pulse 79   Temp 98.2 F (36.8 C) (Oral)   Resp 17   SpO2 97%    Physical Exam Constitutional:      General: She is not in acute distress.    Appearance: She is well-developed.  Cardiovascular:     Rate and Rhythm: Normal rate.  Pulmonary:     Effort: Pulmonary effort is normal.  Musculoskeletal:     Comments: Right hand is warm, red, swollen with approximately 3 cm blister to dorsal aspect of right thumb; redness extending up right forearm; swelling redness and mild warmth to left hand, focused primarily over 5th metacarpal; see photos of right hand/ arm  Blister cleansed with alcohol and 25g needle introduced to decompress/ drain blister; bacitracin and gauze dressing placed; patient tolerated well   Skin:    General: Skin is warm and dry.  Neurological:     Mental Status: She is alert and oriented to person, place, and time.            UC Treatments / Results  Labs (all labs ordered are  listed, but only abnormal results are displayed) Labs Reviewed - No data to display  EKG   Radiology No results found.  Procedures Procedures (including critical care time)  Medications Ordered in UC Medications - No data to display  Initial Impression / Assessment and Plan / UC Course  I have reviewed the triage vital signs and the nursing notes.  Pertinent labs & imaging results that were available during my care of the patient were reviewed by me and considered in my medical decision making (see chart for details).     Bee sting with allergic reaction and concern for infection related to large blister to right hand. Blister drained for comfort with 24g needle, wound care discussed with patient. Antibiotics and oral steroids provided to help with swelling itching and redness. To continue with antihistamines. Return precautions provided. Patient verbalized understanding and agreeable to plan.   Final Clinical Impressions(s) / UC Diagnoses   Final diagnoses:  Bug bite with infection, initial encounter  Bee sting reaction, accidental or unintentional, initial encounter     Discharge Instructions     Wash blistered area daily with soap and water, keep covered to keep clean. The skin will eventually slough off.  Complete course of steroids as well as antibiotics provided.  Continue with antihistamine- either benadryl every 6 hours or daily zyrtec or claritin.  Please return for any worsening of redness, pain, fevers, pus drainage from hand.    ED Prescriptions    Medication Sig Dispense Auth. Provider   predniSONE (STERAPRED UNI-PAK 21 TAB) 10 MG (21) TBPK tablet Take by mouth daily. Per box instruction 21 tablet Kaianna Dolezal B, NP   cephALEXin (KEFLEX) 500 MG capsule Take 1 capsule (500 mg total) by mouth 4 (four) times daily for 7 days. 28 capsule Zigmund Gottron, NP     PDMP not reviewed this encounter.   Zigmund Gottron, NP 01/06/20 1124

## 2020-01-06 NOTE — ED Triage Notes (Signed)
Pt presesnts to UC for bee stings on bilateral hands on Thursday. Pt found to have large fluid filled blister on right hand. Both hands edematous. Pt denies pain, states hands are "tight". Pt states she has been treating at home with benadryl and Cortizone cream.

## 2020-04-01 DIAGNOSIS — R69 Illness, unspecified: Secondary | ICD-10-CM | POA: Diagnosis not present

## 2020-05-02 DIAGNOSIS — H5203 Hypermetropia, bilateral: Secondary | ICD-10-CM | POA: Diagnosis not present

## 2020-05-21 DIAGNOSIS — R69 Illness, unspecified: Secondary | ICD-10-CM | POA: Diagnosis not present

## 2020-06-05 DIAGNOSIS — M8588 Other specified disorders of bone density and structure, other site: Secondary | ICD-10-CM | POA: Diagnosis not present

## 2020-06-05 DIAGNOSIS — Z0000 Encounter for general adult medical examination without abnormal findings: Secondary | ICD-10-CM | POA: Diagnosis not present

## 2020-06-05 DIAGNOSIS — E7800 Pure hypercholesterolemia, unspecified: Secondary | ICD-10-CM | POA: Diagnosis not present

## 2020-06-05 DIAGNOSIS — M25569 Pain in unspecified knee: Secondary | ICD-10-CM | POA: Diagnosis not present

## 2020-06-05 DIAGNOSIS — Z853 Personal history of malignant neoplasm of breast: Secondary | ICD-10-CM | POA: Diagnosis not present

## 2020-06-05 DIAGNOSIS — R030 Elevated blood-pressure reading, without diagnosis of hypertension: Secondary | ICD-10-CM | POA: Diagnosis not present

## 2020-06-06 ENCOUNTER — Telehealth: Payer: Self-pay | Admitting: Hematology and Oncology

## 2020-06-06 NOTE — Telephone Encounter (Signed)
Release: [phone] Faxed medical records to [facility] @ [place] @ fax [phone]

## 2020-06-11 DIAGNOSIS — Z20822 Contact with and (suspected) exposure to covid-19: Secondary | ICD-10-CM | POA: Diagnosis not present

## 2020-06-14 DIAGNOSIS — Z03818 Encounter for observation for suspected exposure to other biological agents ruled out: Secondary | ICD-10-CM | POA: Diagnosis not present

## 2020-06-19 DIAGNOSIS — C50911 Malignant neoplasm of unspecified site of right female breast: Secondary | ICD-10-CM | POA: Diagnosis not present

## 2020-06-20 ENCOUNTER — Other Ambulatory Visit: Payer: Self-pay | Admitting: Internal Medicine

## 2020-06-20 DIAGNOSIS — M8580 Other specified disorders of bone density and structure, unspecified site: Secondary | ICD-10-CM

## 2020-06-25 DIAGNOSIS — Z03818 Encounter for observation for suspected exposure to other biological agents ruled out: Secondary | ICD-10-CM | POA: Diagnosis not present

## 2020-08-08 ENCOUNTER — Telehealth: Payer: Medicare HMO

## 2020-08-08 NOTE — Telephone Encounter (Signed)
Patient drop off paperwork regarding Statement of Chemotherapy Drug Administered - RN contacted pharmacy and paper was forwarded to appropriate personnel. RN left voicemail for patient to update.

## 2020-08-28 ENCOUNTER — Other Ambulatory Visit: Payer: Self-pay | Admitting: Internal Medicine

## 2020-08-28 DIAGNOSIS — Z1231 Encounter for screening mammogram for malignant neoplasm of breast: Secondary | ICD-10-CM

## 2020-10-15 ENCOUNTER — Ambulatory Visit
Admission: RE | Admit: 2020-10-15 | Discharge: 2020-10-15 | Disposition: A | Discharge status: Home / Self Care | Payer: Medicare HMO | Source: Ambulatory Visit | Attending: Internal Medicine | Admitting: Internal Medicine

## 2020-10-15 ENCOUNTER — Other Ambulatory Visit: Payer: Self-pay

## 2020-10-15 DIAGNOSIS — M8589 Other specified disorders of bone density and structure, multiple sites: Secondary | ICD-10-CM | POA: Diagnosis not present

## 2020-10-15 DIAGNOSIS — Z780 Asymptomatic menopausal state: Secondary | ICD-10-CM | POA: Diagnosis not present

## 2020-10-15 DIAGNOSIS — M8580 Other specified disorders of bone density and structure, unspecified site: Secondary | ICD-10-CM

## 2020-10-22 ENCOUNTER — Ambulatory Visit
Admission: RE | Admit: 2020-10-22 | Discharge: 2020-10-22 | Disposition: A | Discharge status: Home / Self Care | Payer: Medicare HMO | Source: Ambulatory Visit | Attending: Internal Medicine | Admitting: Internal Medicine

## 2020-10-22 ENCOUNTER — Ambulatory Visit: Payer: Self-pay

## 2020-10-22 ENCOUNTER — Other Ambulatory Visit: Payer: Self-pay

## 2020-10-22 DIAGNOSIS — Z1231 Encounter for screening mammogram for malignant neoplasm of breast: Secondary | ICD-10-CM

## 2020-11-14 DIAGNOSIS — M25562 Pain in left knee: Secondary | ICD-10-CM | POA: Diagnosis not present

## 2020-11-14 DIAGNOSIS — M1711 Unilateral primary osteoarthritis, right knee: Secondary | ICD-10-CM | POA: Diagnosis not present

## 2020-11-14 DIAGNOSIS — M1712 Unilateral primary osteoarthritis, left knee: Secondary | ICD-10-CM | POA: Diagnosis not present

## 2020-11-14 DIAGNOSIS — M25761 Osteophyte, right knee: Secondary | ICD-10-CM | POA: Diagnosis not present

## 2020-11-14 DIAGNOSIS — M222X1 Patellofemoral disorders, right knee: Secondary | ICD-10-CM | POA: Diagnosis not present

## 2020-11-14 DIAGNOSIS — M222X2 Patellofemoral disorders, left knee: Secondary | ICD-10-CM | POA: Diagnosis not present

## 2020-11-14 DIAGNOSIS — M170 Bilateral primary osteoarthritis of knee: Secondary | ICD-10-CM | POA: Diagnosis not present

## 2020-11-14 DIAGNOSIS — M2352 Chronic instability of knee, left knee: Secondary | ICD-10-CM | POA: Diagnosis not present

## 2020-11-14 DIAGNOSIS — M25561 Pain in right knee: Secondary | ICD-10-CM | POA: Diagnosis not present

## 2020-11-14 DIAGNOSIS — M25762 Osteophyte, left knee: Secondary | ICD-10-CM | POA: Diagnosis not present

## 2020-11-18 DIAGNOSIS — M1711 Unilateral primary osteoarthritis, right knee: Secondary | ICD-10-CM | POA: Diagnosis not present

## 2020-11-21 DIAGNOSIS — M1712 Unilateral primary osteoarthritis, left knee: Secondary | ICD-10-CM | POA: Diagnosis not present

## 2020-11-27 DIAGNOSIS — M1712 Unilateral primary osteoarthritis, left knee: Secondary | ICD-10-CM | POA: Diagnosis not present

## 2020-11-28 DIAGNOSIS — M1711 Unilateral primary osteoarthritis, right knee: Secondary | ICD-10-CM | POA: Diagnosis not present

## 2020-12-02 DIAGNOSIS — M1712 Unilateral primary osteoarthritis, left knee: Secondary | ICD-10-CM | POA: Diagnosis not present

## 2020-12-04 DIAGNOSIS — M1711 Unilateral primary osteoarthritis, right knee: Secondary | ICD-10-CM | POA: Diagnosis not present

## 2020-12-09 DIAGNOSIS — M1712 Unilateral primary osteoarthritis, left knee: Secondary | ICD-10-CM | POA: Diagnosis not present

## 2020-12-12 DIAGNOSIS — M1711 Unilateral primary osteoarthritis, right knee: Secondary | ICD-10-CM | POA: Diagnosis not present

## 2020-12-20 DIAGNOSIS — Z01419 Encounter for gynecological examination (general) (routine) without abnormal findings: Secondary | ICD-10-CM | POA: Diagnosis not present

## 2020-12-20 DIAGNOSIS — Z6829 Body mass index (BMI) 29.0-29.9, adult: Secondary | ICD-10-CM | POA: Diagnosis not present

## 2021-03-07 ENCOUNTER — Ambulatory Visit
Admission: RE | Admit: 2021-03-07 | Discharge: 2021-03-07 | Disposition: A | Discharge status: Home / Self Care | Payer: Medicare HMO | Source: Ambulatory Visit | Attending: Internal Medicine | Admitting: Internal Medicine

## 2021-03-07 ENCOUNTER — Other Ambulatory Visit: Payer: Self-pay | Admitting: Internal Medicine

## 2021-03-07 DIAGNOSIS — R140 Abdominal distension (gaseous): Secondary | ICD-10-CM | POA: Diagnosis not present

## 2021-03-07 DIAGNOSIS — R059 Cough, unspecified: Secondary | ICD-10-CM | POA: Diagnosis not present

## 2021-03-07 DIAGNOSIS — K5900 Constipation, unspecified: Secondary | ICD-10-CM | POA: Diagnosis not present

## 2021-03-07 DIAGNOSIS — R002 Palpitations: Secondary | ICD-10-CM | POA: Diagnosis not present

## 2021-03-13 DIAGNOSIS — R030 Elevated blood-pressure reading, without diagnosis of hypertension: Secondary | ICD-10-CM | POA: Diagnosis not present

## 2021-03-13 DIAGNOSIS — R300 Dysuria: Secondary | ICD-10-CM | POA: Diagnosis not present

## 2021-03-13 DIAGNOSIS — K5900 Constipation, unspecified: Secondary | ICD-10-CM | POA: Diagnosis not present

## 2021-04-10 DIAGNOSIS — R399 Unspecified symptoms and signs involving the genitourinary system: Secondary | ICD-10-CM | POA: Diagnosis not present

## 2021-04-10 DIAGNOSIS — Z111 Encounter for screening for respiratory tuberculosis: Secondary | ICD-10-CM | POA: Diagnosis not present

## 2021-04-15 DIAGNOSIS — Z111 Encounter for screening for respiratory tuberculosis: Secondary | ICD-10-CM | POA: Diagnosis not present

## 2021-04-28 ENCOUNTER — Ambulatory Visit: Payer: Medicare HMO | Admitting: Internal Medicine

## 2021-04-28 ENCOUNTER — Ambulatory Visit
Admission: RE | Admit: 2021-04-28 | Discharge: 2021-04-28 | Disposition: A | Discharge status: Home / Self Care | Payer: Medicare HMO | Source: Ambulatory Visit | Attending: Internal Medicine | Admitting: Internal Medicine

## 2021-04-28 ENCOUNTER — Other Ambulatory Visit: Payer: Self-pay

## 2021-04-28 ENCOUNTER — Other Ambulatory Visit: Payer: Self-pay | Admitting: Internal Medicine

## 2021-04-28 DIAGNOSIS — R7612 Nonspecific reaction to cell mediated immunity measurement of gamma interferon antigen response without active tuberculosis: Secondary | ICD-10-CM

## 2021-04-28 DIAGNOSIS — R7611 Nonspecific reaction to tuberculin skin test without active tuberculosis: Secondary | ICD-10-CM | POA: Diagnosis not present

## 2021-05-01 ENCOUNTER — Encounter: Payer: Self-pay | Admitting: Internal Medicine

## 2021-05-01 ENCOUNTER — Other Ambulatory Visit: Payer: Self-pay

## 2021-05-01 ENCOUNTER — Ambulatory Visit: Payer: Medicare HMO | Admitting: Internal Medicine

## 2021-05-01 VITALS — BP 145/83 | HR 77 | Temp 97.4°F | Resp 16 | Ht 65.0 in | Wt 179.4 lb

## 2021-05-01 DIAGNOSIS — Z227 Latent tuberculosis: Secondary | ICD-10-CM | POA: Diagnosis not present

## 2021-05-01 NOTE — Patient Instructions (Signed)
You were exposed to TB. And you had the "seed" but it never bloomed. And the seed was killed by prior treatment   Your body will always test positive which just means you were exposed   There is no need to repeat testing for tb. Just tell your work place from now on that you tested positive and were treated for "latent tuberculosis" in the past   Please scan your previous department of health card to [place] via mychart, and also to your regular primary care physician  No need to follow up with [place]

## 2021-05-01 NOTE — Progress Notes (Signed)
[name] for Infectious Disease  Reason for Consult:positive quantiferon gold  Referring Provider: Delfina Redwood, MD    Patient Active Problem List   Diagnosis Date Noted   Anxiety disorder 04/19/2014   Prolapse of female pelvic organs 12/18/2013   GERD (gastroesophageal reflux disease) 12/18/2011   Rash 06/04/2011   Palpitations 02/24/2011   HTN (hypertension) 02/24/2011   Breast cancer of upper-outer quadrant of right female breast ([place]) 06/05/2010      HPI: Laurie Allen is a 75 y.o. female hx breast cancer s/p chemo-xrt  Patient is starting a day care provider job and has quantiferon gold which was positive and referred here for evaluation/treatment. She had a chest xray on 10/31 that was normal  She told me in 2007 she has a skin test for tb and was positive (work related) and was treated with 6 months of INH by the [place] [facility]. She forgot to bring the card  She is feeling well No weight loss, fever, chill, nightsweat No chest pain, cough No n/v, diarrhea No dysuria She does have flushing  She grew up with cousin that has tb. 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 type    Allergies  Allergen Reactions   Compazine    Prochlorperazine Rash    OBJECTIVE: Vitals:   05/01/21 0847  BP: (!) 145/83  Pulse: 77  Resp: 16  Temp: (!) 97.4 F (36.3 C)  TempSrc: Temporal  SpO2: 100%  Weight: 179 lb 6.4 oz (81.4 kg)  Height: 5\' 5"  (1.651 m)   Body mass index is 29.85 kg/m.   Physical Exam General/constitutional: no distress, pleasant HEENT: Normocephalic, PER, Conj Clear, EOMI, Oropharynx clear Neck supple CV: rrr no mrg Lungs: clear to auscultation, normal respiratory effort Abd: Soft, Nontender Ext: no edema Skin: No Rash Neuro: nonfocal MSK: no peripheral joint swelling/tenderness/warmth; back spines nontender Psych: alert/oriented   Lab: 10/23 quantiferon gold positive  Microbiology:  Serology:  Imaging: 10/31 cxr Normal appearing xray  Assessment/plan: Problem List Items Addressed This Visit   None Visit Diagnoses     TB lung, latent    -  Primary       Discuss with patient natural history of tb Discuss with her that she was treated for latent tb. The risk for it to now become fully active infection  is miniscule but not zero  Advise her to get record of previous treatment and forward to [place] and to her pcp Advise she'll need to do annual questionaire to screen for symptoms of chronic infection but no further need to retest for tb exposure  I spent 60 minute reviewing data/chart, and coordinating care and >50% direct face to face time providing counseling/discussing diagnostics/treatment plan with patient     Follow-up: No follow-ups on file.  Jabier Mutton, [place] [facility] [phone] pager   [phone] cell 05/01/2021, 9:02 AM

## 2021-05-09 ENCOUNTER — Other Ambulatory Visit: Payer: Self-pay

## 2021-05-09 ENCOUNTER — Ambulatory Visit: Payer: Medicare HMO | Admitting: Cardiology

## 2021-05-09 ENCOUNTER — Ambulatory Visit (INDEPENDENT_AMBULATORY_CARE_PROVIDER_SITE_OTHER): Payer: Medicare HMO

## 2021-05-09 ENCOUNTER — Encounter: Payer: Self-pay | Admitting: Cardiology

## 2021-05-09 VITALS — BP 140/80 | HR 69 | Ht 65.0 in | Wt 176.0 lb

## 2021-05-09 DIAGNOSIS — R079 Chest pain, unspecified: Secondary | ICD-10-CM | POA: Insufficient documentation

## 2021-05-09 DIAGNOSIS — R002 Palpitations: Secondary | ICD-10-CM

## 2021-05-09 DIAGNOSIS — Z01812 Encounter for preprocedural laboratory examination: Secondary | ICD-10-CM

## 2021-05-09 DIAGNOSIS — R011 Cardiac murmur, unspecified: Secondary | ICD-10-CM | POA: Insufficient documentation

## 2021-05-09 DIAGNOSIS — R072 Precordial pain: Secondary | ICD-10-CM

## 2021-05-09 LAB — BASIC METABOLIC PANEL
BUN/Creatinine Ratio: 16 (ref 12–28)
BUN: 15 mg/dL (ref 8–27)
CO2: 24 mmol/L (ref 20–29)
Calcium: 9.4 mg/dL (ref 8.7–10.3)
Chloride: 103 mmol/L (ref 96–106)
Creatinine, Ser: 0.92 mg/dL (ref 0.57–1.00)
Glucose: 98 mg/dL (ref 70–99)
Potassium: 4.3 mmol/L (ref 3.5–5.2)
Sodium: 140 mmol/L (ref 134–144)
eGFR: 65 mL/min/{1.73_m2} (ref >59)

## 2021-05-09 MED ORDER — METOPROLOL TARTRATE 100 MG PO TABS: 100.0000 mg | ORAL_TABLET | ORAL | 0 refills | Status: AC

## 2021-05-09 NOTE — Progress Notes (Unsigned)
Enrolled patient for a 14 day Zio XT  monitor to be mailed to patients home  °

## 2021-05-09 NOTE — Assessment & Plan Note (Signed)
Chest discomfort at times off and on, intermittent, sometimes mild to moderate.  Thinks that it is heartburn.  Given her prior breast cancer radiation history, age we will go ahead and proceed with coronary CT scan with possible FFR analysis for further evaluation.  If coronary plaque is present, since her LDL is greater than 100 we will go ahead and initiate statin therapy if this is the case.  We will follow-up with results of study.

## 2021-05-09 NOTE — Assessment & Plan Note (Signed)
We will check a Zio patch monitor for 14 days to exclude any adverse arrhythmias.

## 2021-05-09 NOTE — Patient Instructions (Signed)
Medication Instructions:  The current medical regimen is effective;  continue present plan and medications.  *If you need a refill on your cardiac medications before your next appointment, please call your pharmacy*  Lab Work: Please have blood work today (BMP)  If you have labs (blood work) drawn today and your tests are completely normal, you will receive your results only by: MyChart Message (if you have MyChart) OR A paper copy in the mail If you have any lab test that is abnormal or we need to change your treatment, we will call you to review the results.  Testing/Procedures: Your physician has requested that you have an echocardiogram. Echocardiography is a painless test that uses sound waves to create images of your heart. It provides your doctor with information about the size and shape of your heart and how well your heart's chambers and valves are working. This procedure takes approximately one hour. There are no restrictions for this procedure.  ZIO XT- Long Term Monitor Instructions  Your physician has requested you wear a ZIO patch monitor for 14 days.  This is a single patch monitor. [employer] supplies one patch monitor per enrollment. Additional stickers are not available. Please do not apply patch if you will be having a Nuclear Stress Test,  Echocardiogram, Cardiac CT, MRI, or Chest Xray during the period you would be wearing the  monitor. The patch cannot be worn during these tests. You cannot remove and re-apply the  ZIO XT patch monitor.  Your ZIO patch monitor will be mailed 3 day USPS to your address on file. It may take 3-5 days  to receive your monitor after you have been enrolled.  Once you have received your monitor, please review the enclosed instructions. Your monitor  has already been registered assigning a specific monitor serial # to you.  Billing and Patient Assistance Program Information  We have supplied [employer] with any of your insurance information on  file for billing purposes. [employer] offers a sliding scale Patient Assistance Program for patients that do not have  insurance, or whose insurance does not completely cover the cost of the ZIO monitor.  You must apply for the Patient Assistance Program to qualify for this discounted rate.  To apply, please call [employer] at [phone], select option 4, select option 2, ask to apply for  Patient Assistance Program. Theodore Demark will ask your household income, and how many people  are in your household. They will quote your out-of-pocket cost based on that information.  [employer] will also be able to set up a 74-month, interest-free payment plan if needed.  Applying the monitor   Shave hair from upper left chest.  Hold abrader disc by orange tab. Rub abrader in 40 strokes over the upper left chest as  indicated in your monitor instructions.  Clean area with 4 enclosed alcohol pads. Let dry.  Apply patch as indicated in monitor instructions. Patch will be placed under collarbone on left  side of chest with arrow pointing upward.  Rub patch adhesive wings for 2 minutes. Remove white label marked "1". Remove the white  label marked "2". Rub patch adhesive wings for 2 additional minutes.  While looking in a mirror, press and release button in center of patch. A small green light will  flash 3-4 times. This will be your only indicator that the monitor has been turned on.  Do not shower for the first 24 hours. You may shower after the first 24 hours.  Press the button if  you feel a symptom. You will hear a small click. Record Date, Time and  Symptom in the Patient Logbook.  When you are ready to remove the patch, follow instructions on the last 2 pages of Patient  Logbook. Stick patch monitor onto the last page of Patient Logbook.  Place Patient Logbook in the blue and white box. Use locking tab on box and tape box closed  securely. The blue and white box has prepaid postage on it. Please place it in the  mailbox as  soon as possible. Your physician should have your test results approximately 7 days after the  monitor has been mailed back to [facility].  Call [place] at [phone] if you have questions regarding  your ZIO XT patch monitor. Call them immediately if you see an orange light blinking on your  monitor.  If your monitor falls off in less than 4 days, contact our Monitor department at [phone].  If your monitor becomes loose or falls off after 4 days call [employer] at [phone] for  suggestions on securing your monitor    Your cardiac CT will be scheduled at:   [facility] [place], [place] [address] [phone]  Please arrive at the [facility] main entrance (entrance A) of [facility] 30 minutes prior to test start time. You can use the FREE valet parking offered at the main entrance (encouraged to control the heart rate for the test) Proceed to the [facility] Radiology Department (first floor) to check-in and test prep.  Please follow these instructions carefully (unless otherwise directed):  On the Night Before the Test: Be sure to Drink plenty of water. Do not consume any caffeinated/decaffeinated beverages or chocolate 12 hours prior to your test. Do not take any antihistamines 12 hours prior to your test.  On the Day of the Test: Drink plenty of water until 1 hour prior to the test. Do not eat any food 4 hours prior to the test. You may take your regular medications prior to the test.  Take metoprolol (Lopressor) two hours prior to test. HOLD Furosemide/Hydrochlorothiazide morning of the test. FEMALES- please wear underwire-free bra if available, avoid dresses & tight clothing      After the Test: Drink plenty of water. After receiving IV contrast, you may experience a mild flushed feeling. This is normal. On occasion, you may experience a mild rash up to 24 hours after the test. This is  not dangerous. If this occurs, you can take Benadryl 25 mg and increase your fluid intake. If you experience trouble breathing, this can be serious. If it is severe call 911 IMMEDIATELY. If it is mild, please call our office.  Please allow 2-4 weeks for scheduling of routine cardiac CTs. Some insurance companies require a pre-authorization which may delay scheduling of this test.   For non-scheduling related questions, please contact the cardiac imaging nurse navigator should you have any questions/concerns: Marchia Bond, Cardiac Imaging Nurse Navigator Gordy Clement, Cardiac Imaging Nurse Navigator [facility] Direct Office Dial: [phone]   For scheduling needs, including cancellations and rescheduling, please call [place], [phone].  Follow-Up: At [facility], you and your health needs are our priority.  As part of our continuing mission to provide you with exceptional heart care, we have created designated Provider Care Teams.  These Care Teams include your primary Cardiologist (physician) and Advanced Practice Providers (APPs -  Physician Assistants and Nurse Practitioners) who all work  together to provide you with the care you need, when you need it.  We recommend signing up for the patient portal called "MyChart".  Sign up information is provided on this After Visit Summary.  MyChart is used to connect with patients for Virtual Visits (Telemedicine).  Patients are able to view lab/test results, encounter notes, upcoming appointments, etc.  Non-urgent messages can be sent to your provider as well.   To learn more about what you can do with MyChart, go to [email or web address].    Your next appointment:   Follow up will be based on the results of the above testing.  Thank you for choosing [facility]!!

## 2021-05-09 NOTE — Assessment & Plan Note (Signed)
We will go ahead and check an echocardiogram.  She states that she had a heart murmur as a child and outgrew it.  I do hear in the left upper sternal border a systolic murmur.  We will evaluate.

## 2021-05-09 NOTE — Progress Notes (Signed)
Cardiology Office Note:    Date:  05/09/2021   ID:  Laurie Allen, DOB [birth year], MRN [id number]  PCP:  Seward Carol, MD   [place] [facility] Cardiologist:  Candee Furbish, MD     Referring MD: Scheryl Marten, [place]    History of Present Illness:    Laurie Allen is a 75 y.o. female here for the evaluation of chest pain, palpitations at the request of [place].  Has prior history of breast cancer status postlumpectomy chemo and XRT on right breast, palpitations, anxiety.  No early family history of coronary artery disease.  Never smoked.  Cancer treatment in 2012. Since than has heart palpitations once and a while and feels acid refux. Can hear her heart beating. Mild CP when walking. Last week felt some sharp pain left side. Sleeping, turned over to right. No SOB. No syncope. No fever, no bleeding.   Aeoribic 2-3 times a week for 45 min.   Past Medical History:  Diagnosis Date   Anxiety    Breast cancer ([place])    GERD (gastroesophageal reflux disease)    GERD (gastroesophageal reflux disease) 12/18/2011   Hx Breast cancer, IDC, Right, Stage II, Receptor -, Her 2 + 06/05/2010   Neuromuscular disorder ([place])    tingling/burning in feet - tx gabapentin   Palpitations    History r/t anxiety on LORazepam - no problems since meds   Personal history of chemotherapy    Personal history of radiation therapy    Rash 06/04/2011   history   SVD (spontaneous vaginal delivery)    x 4    Past Surgical History:  Procedure Laterality Date   24 HOUR [place] STUDY  01/18/2012   Procedure: 24 HOUR [place] STUDY;  Surgeon: Garlan Fair, MD;  Location: [facility];  Service: Endoscopy;  Laterality: N/A;   ANTERIOR AND POSTERIOR REPAIR N/A 12/18/2013   Procedure: ANTERIOR (CYSTOCELE) ;  Surgeon: Marylynn Pearson, MD;  Location: [facility];  Service: Gynecology;  Laterality: N/A;   BLADDER SURGERY     BREAST LUMPECTOMY Right 2012   with lymph nodes   BREAST SURGERY     ESOPHAGEAL MANOMETRY   01/18/2012   Procedure: ESOPHAGEAL MANOMETRY (EM);  Surgeon: Garlan Fair, MD;  Location: [facility];  Service: Endoscopy;  Laterality: N/A;   FOOT SURGERY     bilateral bunions removed   LAPAROSCOPIC ASSISTED VAGINAL HYSTERECTOMY Bilateral 12/18/2013   Procedure: LAPAROSCOPIC ASSISTED VAGINAL HYSTERECTOMY WITH BILATERAL SALPINGO OOPHORECTOMY;  Surgeon: Marylynn Pearson, MD;  Location: [facility];  Service: Gynecology;  Laterality: Bilateral;   MASTECTOMY PARTIAL / LUMPECTOMY W/ AXILLARY LYMPHADENECTOMY  01/06/2011   Right- Dr Margot Chimes   [facility] REMOVAL  10/12/2011   Procedure: REMOVAL PORT-A-CATH;  Surgeon: Haywood Lasso, MD;  Location: [facility];  Service: General;  Laterality: Right;   PORTACATH PLACEMENT  06/17/2010   TUBAL LIGATION      Current Medications: Current Meds  Medication Sig   Ascorbic Acid (VITAMIN C) 1000 MG tablet Take 1,000 mg by mouth daily.   B Complex Vitamins (VITAMIN B-COMPLEX PO) Take 1 tablet by mouth daily.    calcium-vitamin D (OSCAL WITH D) 500-200 MG-UNIT per tablet Take 2 tablets by mouth daily with breakfast.   Cholecalciferol (VITAMIN D3) 1000 UNITS CAPS Take 1 capsule by mouth daily.    metoprolol tartrate (LOPRESSOR) 100 MG tablet Take 1 tablet (100 mg total) by mouth as directed. Take (1) tablet 2 hours before your CT  polyvinyl alcohol (LIQUIFILM TEARS) 1.4 % ophthalmic solution Place 1 drop into both eyes daily as needed for dry eyes.      Allergies:   Compazine and Prochlorperazine   Social History   Socioeconomic History   Marital status: Widowed    Spouse name: Not on file   Number of children: 4   Years of education: Not on file   Highest education level: Not on file  Occupational History   Occupation: Retired-customer service  Tobacco Use   Smoking status: Never   Smokeless tobacco: Never  Substance and Sexual Activity   Alcohol use: No   Drug use: No   Sexual activity: Not Currently    Birth  control/protection: Post-menopausal  Other Topics Concern   Not on file  Social History Narrative   Not on file   Social Determinants of Health   Financial Resource Strain: Not on file  Food Insecurity: Not on file  Transportation Needs: Not on file  Physical Activity: Not on file  Stress: Not on file  Social Connections: Not on file     Family History: The patient's family history includes Cancer (age of onset: 81) in her father.  ROS:   Please see the history of present illness.     All other systems reviewed and are negative.  EKGs/Labs/Other Studies Reviewed:    The following studies were reviewed today: Prior office notes reviewed, lab work reviewed LDL from outside labs 116 triglycerides 104 hemoglobin 13.5 creatinine 0.9 potassium 4.7 ALT 18 TSH 1.5  Recent chest x-ray on 04/28/2021 for a positive TB test was negative.  Personally reviewed and interpreted.  EKG:  EKG is  ordered today.  The ekg ordered today demonstrates sinus rhythm 69 no other abnormalities.  Recent Labs: No results found for requested labs within last 8760 hours.  Recent Lipid Panel No results found for: CHOL, TRIG, HDL, CHOLHDL, VLDL, LDLCALC, LDLDIRECT   Risk Assessment/Calculations:          Physical Exam:    VS:  BP 140/80 (BP Location: Left Arm, Patient Position: Sitting, Cuff Size: Normal)   Pulse 69   Ht 5\' 5"  (1.651 m)   Wt 176 lb (79.8 kg)   SpO2 97%   BMI 29.29 kg/m     Wt Readings from Last 3 Encounters:  05/09/21 176 lb (79.8 kg)  05/01/21 179 lb 6.4 oz (81.4 kg)  09/22/19 179 lb 9.6 oz (81.5 kg)     GEN:  Well nourished, well developed in no acute distress HEENT: Normal NECK: No JVD; No carotid bruits LYMPHATICS: No lymphadenopathy CARDIAC: RRR, 1/6 systolic murmur left upper sternal border, no rubs, gallops RESPIRATORY:  Clear to auscultation without rales, wheezing or rhonchi  ABDOMEN: Soft, non-tender, non-distended MUSCULOSKELETAL:  No edema; No deformity   SKIN: Warm and dry NEUROLOGIC:  Alert and oriented x 3 PSYCHIATRIC:  Normal affect   ASSESSMENT:    1. Precordial pain   2. Palpitations   3. Murmur, cardiac   4. Pre-procedure lab exam   5. Chest pain of uncertain etiology   6. Heart murmur    PLAN:    In order of problems listed above:  Palpitations We will check a Zio patch monitor for 14 days to exclude any adverse arrhythmias.  Chest pain of uncertain etiology Chest discomfort at times off and on, intermittent, sometimes mild to moderate.  Thinks that it is heartburn.  Given her prior breast cancer radiation history, age we will go ahead and proceed  with coronary CT scan with possible FFR analysis for further evaluation.  If coronary plaque is present, since her LDL is greater than 100 we will go ahead and initiate statin therapy if this is the case.  We will follow-up with results of study.   Heart murmur We will go ahead and check an echocardiogram.  She states that she had a heart murmur as a child and outgrew it.  I do hear in the left upper sternal border a systolic murmur.  We will evaluate.         Medication Adjustments/Labs and Tests Ordered: Current medicines are reviewed at length with the patient today.  Concerns regarding medicines are outlined above.  Orders Placed This Encounter  Procedures   CT CORONARY MORPH W/CTA COR W/SCORE W/CA W/CM &/OR WO/CM   Basic metabolic panel   LONG TERM MONITOR (3-14 DAYS)   EKG 12-Lead   ECHOCARDIOGRAM COMPLETE    Meds ordered this encounter  Medications   metoprolol tartrate (LOPRESSOR) 100 MG tablet    Sig: Take 1 tablet (100 mg total) by mouth as directed. Take (1) tablet 2 hours before your CT    Dispense:  1 tablet    Refill:  0     Patient Instructions  Medication Instructions:  The current medical regimen is effective;  continue present plan and medications.  *If you need a refill on your cardiac medications before your next appointment, please call  your pharmacy*  Lab Work: Please have blood work today (BMP)  If you have labs (blood work) drawn today and your tests are completely normal, you will receive your results only by: MyChart Message (if you have MyChart) OR A paper copy in the mail If you have any lab test that is abnormal or we need to change your treatment, we will call you to review the results.  Testing/Procedures: Your physician has requested that you have an echocardiogram. Echocardiography is a painless test that uses sound waves to create images of your heart. It provides your doctor with information about the size and shape of your heart and how well your heart's chambers and valves are working. This procedure takes approximately one hour. There are no restrictions for this procedure.  ZIO XT- Long Term Monitor Instructions  Your physician has requested you wear a ZIO patch monitor for 14 days.  This is a single patch monitor. [employer] supplies one patch monitor per enrollment. Additional stickers are not available. Please do not apply patch if you will be having a Nuclear Stress Test,  Echocardiogram, Cardiac CT, MRI, or Chest Xray during the period you would be wearing the  monitor. The patch cannot be worn during these tests. You cannot remove and re-apply the  ZIO XT patch monitor.  Your ZIO patch monitor will be mailed 3 day USPS to your address on file. It may take 3-5 days  to receive your monitor after you have been enrolled.  Once you have received your monitor, please review the enclosed instructions. Your monitor  has already been registered assigning a specific monitor serial # to you.  Billing and Patient Assistance Program Information  We have supplied [employer] with any of your insurance information on file for billing purposes. [employer] offers a sliding scale Patient Assistance Program for patients that do not have  insurance, or whose insurance does not completely cover the cost of the ZIO monitor.   You must apply for the Patient Assistance Program to qualify for this discounted rate.  To apply, please call [employer] at [phone], select option 4, select option 2, ask to apply for  Patient Assistance Program. Theodore Demark will ask your household income, and how many people  are in your household. They will quote your out-of-pocket cost based on that information.  [employer] will also be able to set up a 72-month, interest-free payment plan if needed.  Applying the monitor   Shave hair from upper left chest.  Hold abrader disc by orange tab. Rub abrader in 40 strokes over the upper left chest as  indicated in your monitor instructions.  Clean area with 4 enclosed alcohol pads. Let dry.  Apply patch as indicated in monitor instructions. Patch will be placed under collarbone on left  side of chest with arrow pointing upward.  Rub patch adhesive wings for 2 minutes. Remove white label marked "1". Remove the white  label marked "2". Rub patch adhesive wings for 2 additional minutes.  While looking in a mirror, press and release button in center of patch. A small green light will  flash 3-4 times. This will be your only indicator that the monitor has been turned on.  Do not shower for the first 24 hours. You may shower after the first 24 hours.  Press the button if you feel a symptom. You will hear a small click. Record Date, Time and  Symptom in the Patient Logbook.  When you are ready to remove the patch, follow instructions on the last 2 pages of Patient  Logbook. Stick patch monitor onto the last page of Patient Logbook.  Place Patient Logbook in the blue and white box. Use locking tab on box and tape box closed  securely. The blue and white box has prepaid postage on it. Please place it in the mailbox as  soon as possible. Your physician should have your test results approximately 7 days after the  monitor has been mailed back to [facility].  Call [facility] at  [phone] if you have questions regarding  your ZIO XT patch monitor. Call them immediately if you see an orange light blinking on your  monitor.  If your monitor falls off in less than 4 days, contact our Monitor department at [phone].  If your monitor becomes loose or falls off after 4 days call [employer] at [phone] for  suggestions on securing your monitor    Your cardiac CT will be scheduled at:   [facility] [place], [place] [address] [phone]  Please arrive at the [facility] main entrance (entrance A) of [facility] 30 minutes prior to test start time. You can use the FREE valet parking offered at the main entrance (encouraged to control the heart rate for the test) Proceed to the [facility] Radiology Department (first floor) to check-in and test prep.  Please follow these instructions carefully (unless otherwise directed):  On the Night Before the Test: Be sure to Drink plenty of water. Do not consume any caffeinated/decaffeinated beverages or chocolate 12 hours prior to your test. Do not take any antihistamines 12 hours prior to your test.  On the Day of the Test: Drink plenty of water until 1 hour prior to the test. Do not eat any food 4 hours prior to the test. You may take your regular medications prior to the test.  Take metoprolol (Lopressor) two hours prior to test. HOLD Furosemide/Hydrochlorothiazide morning of the test. FEMALES- please wear underwire-free bra if available, avoid dresses & tight clothing  After the Test: Drink plenty of water. After receiving IV contrast, you may experience a mild flushed feeling. This is normal. On occasion, you may experience a mild rash up to 24 hours after the test. This is not dangerous. If this occurs, you can take Benadryl 25 mg and increase your fluid intake. If you experience trouble breathing, this can be serious. If it is severe call [phone] IMMEDIATELY. If it is  mild, please call our office.  Please allow 2-4 weeks for scheduling of routine cardiac CTs. Some insurance companies require a pre-authorization which may delay scheduling of this test.   For non-scheduling related questions, please contact the cardiac imaging nurse navigator should you have any questions/concerns: Marchia Bond, Cardiac Imaging Nurse Navigator Gordy Clement, Cardiac Imaging Nurse Navigator [place] [facility] Direct Office Dial: [phone]   For scheduling needs, including cancellations and rescheduling, please call [place], [phone].  Follow-Up: At [facility], you and your health needs are our priority.  As part of our continuing mission to provide you with exceptional heart care, we have created designated Provider Care Teams.  These Care Teams include your primary Cardiologist (physician) and Advanced Practice Providers (APPs -  Physician Assistants and Nurse Practitioners) who all work together to provide you with the care you need, when you need it.  We recommend signing up for the patient portal called "MyChart".  Sign up information is provided on this After Visit Summary.  MyChart is used to connect with patients for Virtual Visits (Telemedicine).  Patients are able to view lab/test results, encounter notes, upcoming appointments, etc.  Non-urgent messages can be sent to your provider as well.   To learn more about what you can do with MyChart, go to [email or web address].    Your next appointment:   Follow up will be based on the results of the above testing.  Thank you for choosing [facility]!!      Signed, Candee Furbish, MD  05/09/2021 1:09 PM    [place]

## 2021-05-19 ENCOUNTER — Telehealth (HOSPITAL_COMMUNITY): Payer: Self-pay | Admitting: Emergency Medicine

## 2021-05-19 NOTE — Telephone Encounter (Signed)
Attempted to call patient regarding upcoming cardiac CT appointment. °Left message on voicemail with name and callback number °Amarea Macdowell RN Navigator Cardiac Imaging °[facility] °[phone] Office °[phone] Cell ° °

## 2021-05-20 ENCOUNTER — Telehealth (HOSPITAL_COMMUNITY): Payer: Self-pay | Admitting: Emergency Medicine

## 2021-05-20 NOTE — Telephone Encounter (Signed)
Pt returning phone call regarding upcoming cardiac imaging study; pt verbalizes understanding of appt date/time, parking situation and where to check in, pre-test NPO status and medications ordered, and verified current allergies; name and call back number provided for further questions should they arise Marchia Bond RN [place] [facility] [phone] office [phone] cell   Pt states she got a phone call from pre-service center asking for $120.00 - she states she does not have that kind of money at this time, wanting to know if this is billable or due at the time of service. I gave her billing phone number to clarify.   If they require the full amt up front - she will call me to r/s If they will allow it to be broken up or billed she will continue with test tomorrow as scheduled.   Arrival 9:00am 100mg  metoprolol tartrate R arm restriction due to breast ca

## 2021-05-20 NOTE — Telephone Encounter (Signed)
Attempted to call patient regarding upcoming cardiac CT appointment. °Left message on voicemail with name and callback number °Kadin Bera RN Navigator Cardiac Imaging °[facility] °[phone] Office °[phone] Cell ° °

## 2021-05-21 ENCOUNTER — Other Ambulatory Visit: Payer: Self-pay

## 2021-05-21 ENCOUNTER — Encounter (HOSPITAL_COMMUNITY): Payer: Self-pay

## 2021-05-21 ENCOUNTER — Ambulatory Visit (HOSPITAL_COMMUNITY)
Admission: RE | Admit: 2021-05-21 | Discharge: 2021-05-21 | Disposition: A | Discharge status: Home / Self Care | Payer: Medicare HMO | Source: Ambulatory Visit | Attending: Cardiology | Admitting: Cardiology

## 2021-05-21 DIAGNOSIS — R072 Precordial pain: Secondary | ICD-10-CM | POA: Insufficient documentation

## 2021-05-21 MED ORDER — IOHEXOL 350 MG/ML SOLN
95.0000 mL | Freq: Once | INTRAVENOUS | Status: AC | PRN
  Administered 2021-05-21: 95 mL via INTRAVENOUS

## 2021-05-21 MED ORDER — NITROGLYCERIN 0.4 MG SL SUBL
SUBLINGUAL_TABLET | SUBLINGUAL | Status: AC
  Filled 2021-05-21: qty 2

## 2021-05-21 MED ORDER — NITROGLYCERIN 0.4 MG SL SUBL
0.8000 mg | SUBLINGUAL_TABLET | Freq: Once | SUBLINGUAL | Status: AC
  Administered 2021-05-21: 0.8 mg via SUBLINGUAL

## 2021-05-26 ENCOUNTER — Telehealth (HOSPITAL_COMMUNITY): Payer: Self-pay | Admitting: *Deleted

## 2021-05-26 NOTE — Telephone Encounter (Signed)
Returning patient's voicemail concerning cardiac CT results. Gave patient cardiac CT results per Dr. Kingsley Plan results notes. Patient verbalized understanding of results.  Gordy Clement RN Navigator Cardiac Imaging [facility] [phone] Office [phone] Cell

## 2021-05-29 ENCOUNTER — Ambulatory Visit
Admission: RE | Admit: 2021-05-29 | Discharge: 2021-05-29 | Disposition: A | Discharge status: Home / Self Care | Payer: Medicare HMO | Source: Ambulatory Visit | Attending: Internal Medicine | Admitting: Internal Medicine

## 2021-05-29 ENCOUNTER — Telehealth: Payer: Self-pay

## 2021-05-29 ENCOUNTER — Other Ambulatory Visit: Payer: Self-pay | Admitting: Internal Medicine

## 2021-05-29 DIAGNOSIS — M19012 Primary osteoarthritis, left shoulder: Secondary | ICD-10-CM | POA: Diagnosis not present

## 2021-05-29 DIAGNOSIS — M25512 Pain in left shoulder: Secondary | ICD-10-CM

## 2021-05-29 DIAGNOSIS — R52 Pain, unspecified: Secondary | ICD-10-CM

## 2021-05-29 DIAGNOSIS — M778 Other enthesopathies, not elsewhere classified: Secondary | ICD-10-CM | POA: Diagnosis not present

## 2021-05-29 DIAGNOSIS — W19XXXA Unspecified fall, initial encounter: Secondary | ICD-10-CM | POA: Diagnosis not present

## 2021-05-29 DIAGNOSIS — S59902A Unspecified injury of left elbow, initial encounter: Secondary | ICD-10-CM | POA: Diagnosis not present

## 2021-05-29 DIAGNOSIS — M25522 Pain in left elbow: Secondary | ICD-10-CM | POA: Diagnosis not present

## 2021-05-29 NOTE — Telephone Encounter (Signed)
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 Will have card scanned into chart. Proof of treatment will be available under media tab.  Leatrice Jewels, RMA

## 2021-05-31 DIAGNOSIS — E663 Overweight: Secondary | ICD-10-CM | POA: Diagnosis not present

## 2021-05-31 DIAGNOSIS — Z809 Family history of malignant neoplasm, unspecified: Secondary | ICD-10-CM | POA: Diagnosis not present

## 2021-05-31 DIAGNOSIS — Z853 Personal history of malignant neoplasm of breast: Secondary | ICD-10-CM | POA: Diagnosis not present

## 2021-06-05 ENCOUNTER — Other Ambulatory Visit: Payer: Self-pay

## 2021-06-05 ENCOUNTER — Ambulatory Visit (HOSPITAL_COMMUNITY): Payer: Medicare HMO | Attending: Cardiology

## 2021-06-05 DIAGNOSIS — R002 Palpitations: Secondary | ICD-10-CM | POA: Diagnosis not present

## 2021-06-05 DIAGNOSIS — R011 Cardiac murmur, unspecified: Secondary | ICD-10-CM | POA: Insufficient documentation

## 2021-06-05 LAB — ECHOCARDIOGRAM COMPLETE
Area-P 1/2: 4.02 cm2
S' Lateral: 2 cm

## 2021-06-11 DIAGNOSIS — M25512 Pain in left shoulder: Secondary | ICD-10-CM | POA: Diagnosis not present

## 2021-06-13 DIAGNOSIS — R69 Illness, unspecified: Secondary | ICD-10-CM | POA: Diagnosis not present

## 2021-06-13 DIAGNOSIS — Z86010 Personal history of colonic polyps: Secondary | ICD-10-CM | POA: Diagnosis not present

## 2021-06-13 DIAGNOSIS — K5900 Constipation, unspecified: Secondary | ICD-10-CM | POA: Diagnosis not present

## 2021-06-13 DIAGNOSIS — Z853 Personal history of malignant neoplasm of breast: Secondary | ICD-10-CM | POA: Diagnosis not present

## 2021-06-13 DIAGNOSIS — R1310 Dysphagia, unspecified: Secondary | ICD-10-CM | POA: Diagnosis not present

## 2021-06-13 DIAGNOSIS — Z0000 Encounter for general adult medical examination without abnormal findings: Secondary | ICD-10-CM | POA: Diagnosis not present

## 2021-06-13 DIAGNOSIS — K219 Gastro-esophageal reflux disease without esophagitis: Secondary | ICD-10-CM | POA: Diagnosis not present

## 2021-06-13 DIAGNOSIS — E7800 Pure hypercholesterolemia, unspecified: Secondary | ICD-10-CM | POA: Diagnosis not present

## 2021-06-19 DIAGNOSIS — E669 Obesity, unspecified: Secondary | ICD-10-CM | POA: Diagnosis not present

## 2021-06-19 DIAGNOSIS — Z0000 Encounter for general adult medical examination without abnormal findings: Secondary | ICD-10-CM | POA: Diagnosis not present

## 2021-06-19 DIAGNOSIS — Z6830 Body mass index (BMI) 30.0-30.9, adult: Secondary | ICD-10-CM | POA: Diagnosis not present

## 2021-06-25 DIAGNOSIS — R002 Palpitations: Secondary | ICD-10-CM | POA: Diagnosis not present

## 2021-06-27 DIAGNOSIS — M25512 Pain in left shoulder: Secondary | ICD-10-CM | POA: Diagnosis not present

## 2021-07-04 DIAGNOSIS — M25512 Pain in left shoulder: Secondary | ICD-10-CM | POA: Diagnosis not present

## 2021-07-16 ENCOUNTER — Encounter: Payer: Self-pay | Admitting: *Deleted

## 2021-07-16 DIAGNOSIS — R69 Illness, unspecified: Secondary | ICD-10-CM | POA: Diagnosis not present

## 2021-07-16 DIAGNOSIS — E7800 Pure hypercholesterolemia, unspecified: Secondary | ICD-10-CM | POA: Diagnosis not present

## 2021-07-16 DIAGNOSIS — Z853 Personal history of malignant neoplasm of breast: Secondary | ICD-10-CM | POA: Diagnosis not present

## 2021-07-16 DIAGNOSIS — M8588 Other specified disorders of bone density and structure, other site: Secondary | ICD-10-CM | POA: Diagnosis not present

## 2021-07-16 DIAGNOSIS — Z86010 Personal history of colonic polyps: Secondary | ICD-10-CM | POA: Diagnosis not present

## 2021-08-20 DIAGNOSIS — M25612 Stiffness of left shoulder, not elsewhere classified: Secondary | ICD-10-CM | POA: Diagnosis not present

## 2021-08-20 DIAGNOSIS — R531 Weakness: Secondary | ICD-10-CM | POA: Diagnosis not present

## 2021-08-20 DIAGNOSIS — M67912 Unspecified disorder of synovium and tendon, left shoulder: Secondary | ICD-10-CM | POA: Diagnosis not present

## 2021-08-20 DIAGNOSIS — S42255D Nondisplaced fracture of greater tuberosity of left humerus, subsequent encounter for fracture with routine healing: Secondary | ICD-10-CM | POA: Diagnosis not present

## 2021-09-25 ENCOUNTER — Other Ambulatory Visit: Payer: Self-pay | Admitting: Internal Medicine

## 2021-09-25 DIAGNOSIS — Z1231 Encounter for screening mammogram for malignant neoplasm of breast: Secondary | ICD-10-CM

## 2021-10-15 DIAGNOSIS — M25512 Pain in left shoulder: Secondary | ICD-10-CM | POA: Diagnosis not present

## 2021-10-27 ENCOUNTER — Ambulatory Visit
Admission: RE | Admit: 2021-10-27 | Discharge: 2021-10-27 | Disposition: A | Discharge status: Home / Self Care | Payer: Medicare HMO | Source: Ambulatory Visit | Attending: Internal Medicine | Admitting: Internal Medicine

## 2021-10-27 DIAGNOSIS — Z1231 Encounter for screening mammogram for malignant neoplasm of breast: Secondary | ICD-10-CM

## 2021-12-24 DIAGNOSIS — Z1272 Encounter for screening for malignant neoplasm of vagina: Secondary | ICD-10-CM | POA: Diagnosis not present

## 2021-12-24 DIAGNOSIS — R69 Illness, unspecified: Secondary | ICD-10-CM | POA: Diagnosis not present

## 2021-12-24 DIAGNOSIS — R8290 Unspecified abnormal findings in urine: Secondary | ICD-10-CM | POA: Diagnosis not present

## 2021-12-24 DIAGNOSIS — Z01419 Encounter for gynecological examination (general) (routine) without abnormal findings: Secondary | ICD-10-CM | POA: Diagnosis not present

## 2021-12-24 DIAGNOSIS — L989 Disorder of the skin and subcutaneous tissue, unspecified: Secondary | ICD-10-CM | POA: Diagnosis not present

## 2021-12-24 DIAGNOSIS — Z6829 Body mass index (BMI) 29.0-29.9, adult: Secondary | ICD-10-CM | POA: Diagnosis not present

## 2022-03-21 LAB — GLUCOSE, POCT (MANUAL RESULT ENTRY): POC Glucose: 127 mg/dl — AB (ref 70–99)

## 2022-04-14 DIAGNOSIS — Z008 Encounter for other general examination: Secondary | ICD-10-CM | POA: Diagnosis not present

## 2022-04-20 DIAGNOSIS — C50911 Malignant neoplasm of unspecified site of right female breast: Secondary | ICD-10-CM | POA: Diagnosis not present

## 2022-07-08 DIAGNOSIS — E7800 Pure hypercholesterolemia, unspecified: Secondary | ICD-10-CM | POA: Diagnosis not present

## 2022-07-08 DIAGNOSIS — M8588 Other specified disorders of bone density and structure, other site: Secondary | ICD-10-CM | POA: Diagnosis not present

## 2022-07-08 DIAGNOSIS — Z853 Personal history of malignant neoplasm of breast: Secondary | ICD-10-CM | POA: Diagnosis not present

## 2022-07-08 DIAGNOSIS — Z5181 Encounter for therapeutic drug level monitoring: Secondary | ICD-10-CM | POA: Diagnosis not present

## 2022-07-08 DIAGNOSIS — Z23 Encounter for immunization: Secondary | ICD-10-CM | POA: Diagnosis not present

## 2022-07-08 DIAGNOSIS — Z0000 Encounter for general adult medical examination without abnormal findings: Secondary | ICD-10-CM | POA: Diagnosis not present

## 2022-07-08 DIAGNOSIS — B351 Tinea unguium: Secondary | ICD-10-CM | POA: Diagnosis not present

## 2022-08-27 DIAGNOSIS — K219 Gastro-esophageal reflux disease without esophagitis: Secondary | ICD-10-CM | POA: Diagnosis not present

## 2022-08-27 DIAGNOSIS — I10 Essential (primary) hypertension: Secondary | ICD-10-CM | POA: Diagnosis not present

## 2022-08-27 DIAGNOSIS — Z86010 Personal history of colonic polyps: Secondary | ICD-10-CM | POA: Diagnosis not present

## 2022-09-05 DIAGNOSIS — H2511 Age-related nuclear cataract, right eye: Secondary | ICD-10-CM | POA: Diagnosis not present

## 2022-09-05 DIAGNOSIS — H52209 Unspecified astigmatism, unspecified eye: Secondary | ICD-10-CM | POA: Diagnosis not present

## 2022-09-05 DIAGNOSIS — H2512 Age-related nuclear cataract, left eye: Secondary | ICD-10-CM | POA: Diagnosis not present

## 2022-09-05 DIAGNOSIS — H5213 Myopia, bilateral: Secondary | ICD-10-CM | POA: Diagnosis not present

## 2022-09-05 DIAGNOSIS — H524 Presbyopia: Secondary | ICD-10-CM | POA: Diagnosis not present

## 2022-09-22 DIAGNOSIS — D123 Benign neoplasm of transverse colon: Secondary | ICD-10-CM | POA: Diagnosis not present

## 2022-09-22 DIAGNOSIS — Z09 Encounter for follow-up examination after completed treatment for conditions other than malignant neoplasm: Secondary | ICD-10-CM | POA: Diagnosis not present

## 2022-09-22 DIAGNOSIS — Z86010 Personal history of colonic polyps: Secondary | ICD-10-CM | POA: Diagnosis not present

## 2022-09-24 ENCOUNTER — Other Ambulatory Visit: Payer: Self-pay | Admitting: Internal Medicine

## 2022-09-24 DIAGNOSIS — Z1231 Encounter for screening mammogram for malignant neoplasm of breast: Secondary | ICD-10-CM

## 2022-09-28 DIAGNOSIS — D123 Benign neoplasm of transverse colon: Secondary | ICD-10-CM | POA: Diagnosis not present

## 2022-11-09 DIAGNOSIS — R300 Dysuria: Secondary | ICD-10-CM | POA: Diagnosis not present

## 2022-11-10 ENCOUNTER — Ambulatory Visit: Payer: Medicare HMO

## 2022-11-20 ENCOUNTER — Ambulatory Visit
Admission: RE | Admit: 2022-11-20 | Discharge: 2022-11-20 | Disposition: A | Discharge status: Home / Self Care | Payer: Medicare HMO | Source: Ambulatory Visit | Attending: Internal Medicine | Admitting: Internal Medicine

## 2022-11-20 DIAGNOSIS — Z1231 Encounter for screening mammogram for malignant neoplasm of breast: Secondary | ICD-10-CM | POA: Diagnosis not present

## 2022-11-27 IMAGING — MG MM DIGITAL SCREENING BILAT W/ TOMO AND CAD
8 series · 9 of 24 positions shown · non-contrast
Comparison: Previous exam(s).

CLINICAL DATA: Screening.

EXAM:
DIGITAL SCREENING BILATERAL MAMMOGRAM WITH TOMOSYNTHESIS AND CAD
TECHNIQUE: Bilateral screening digital craniocaudal and mediolateral oblique
mammograms were obtained. Bilateral screening digital breast
tomosynthesis was performed. The images were evaluated with
computer-aided detection.

[L CC synth-2D]
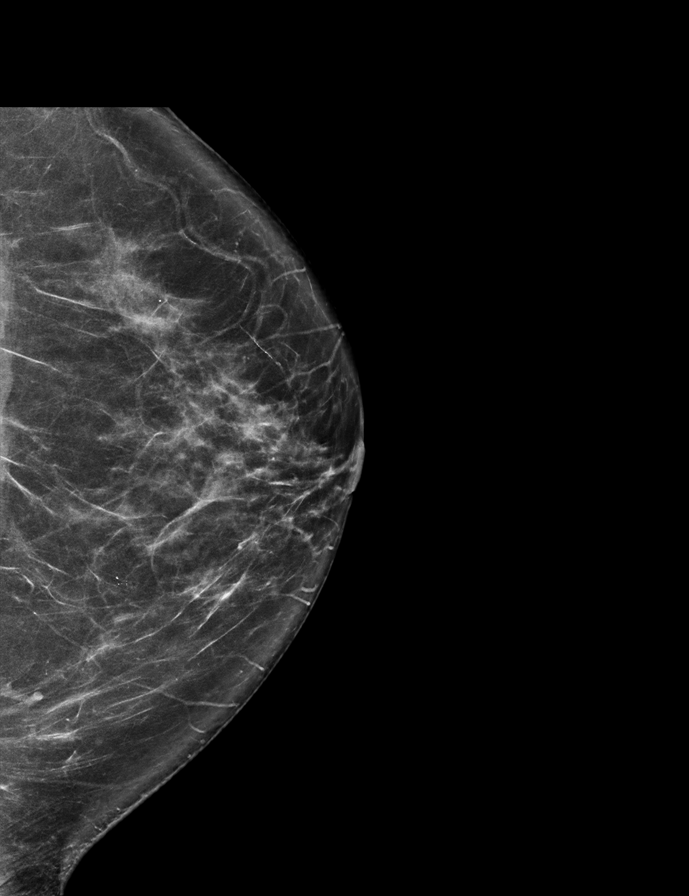

[R MLO synth-2D]
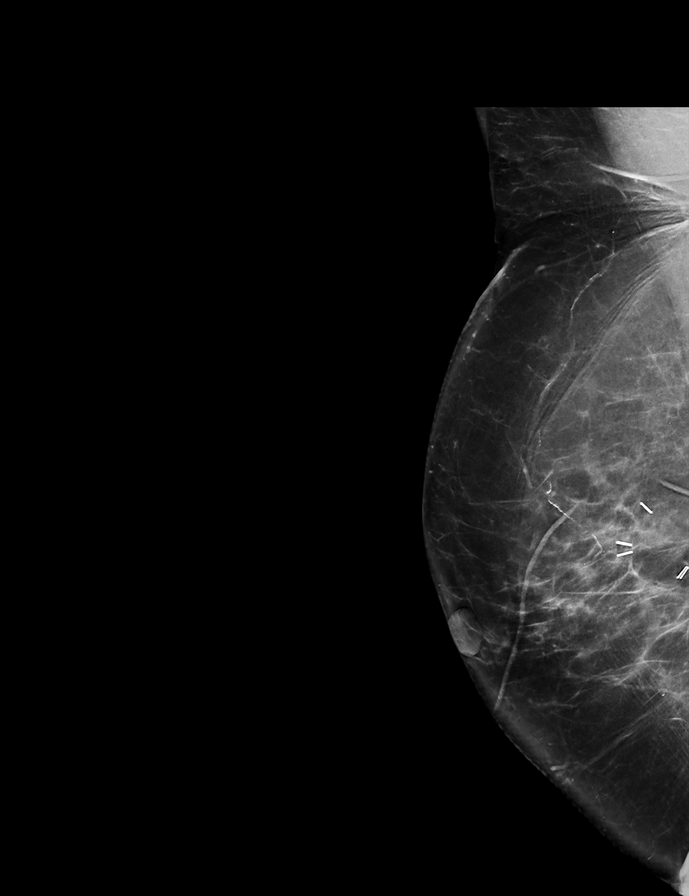

[L MLO synth-2D]
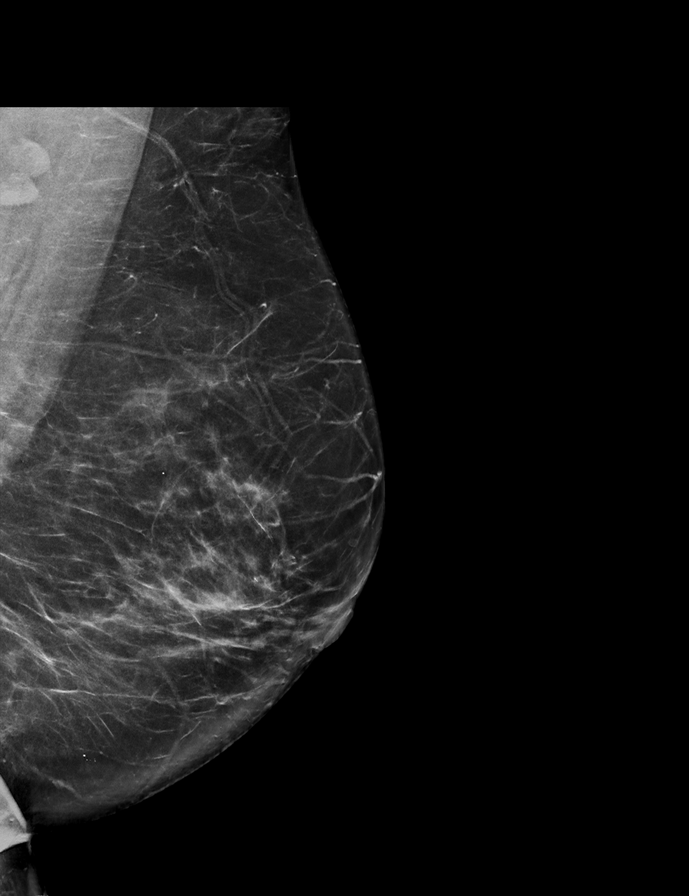

[R CC synth-2D]
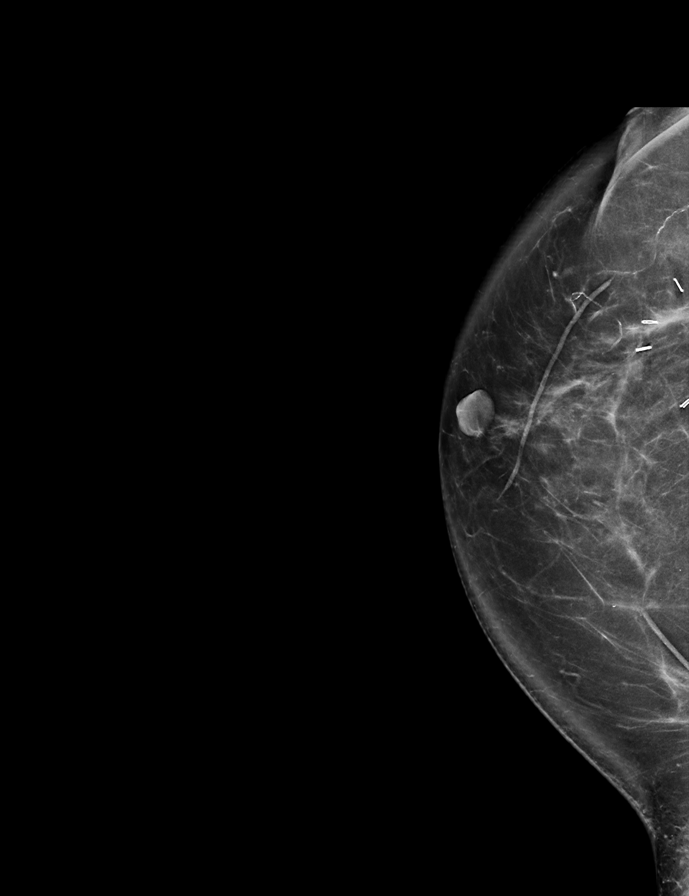

[R MLO tomo · 2 of 80 frames shown]
[frame 26/80]
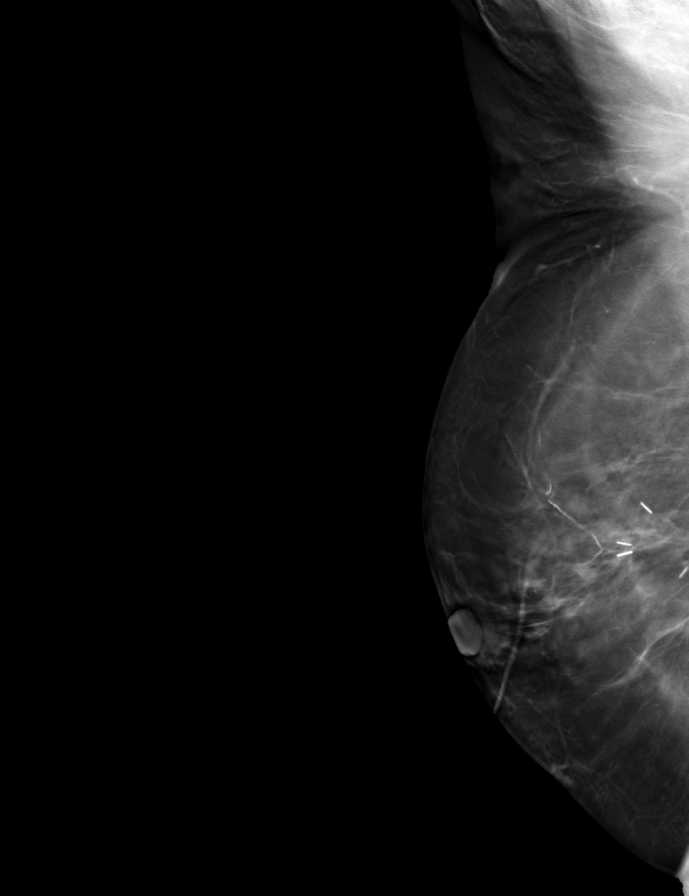
[frame 41/80]
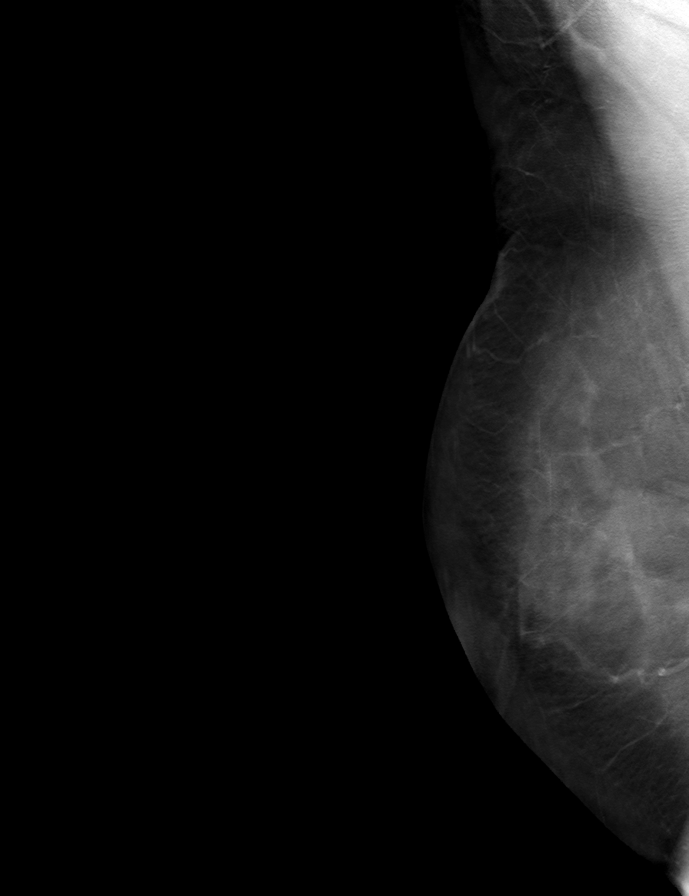

[R CC tomo · tomo slice 39/76.0]
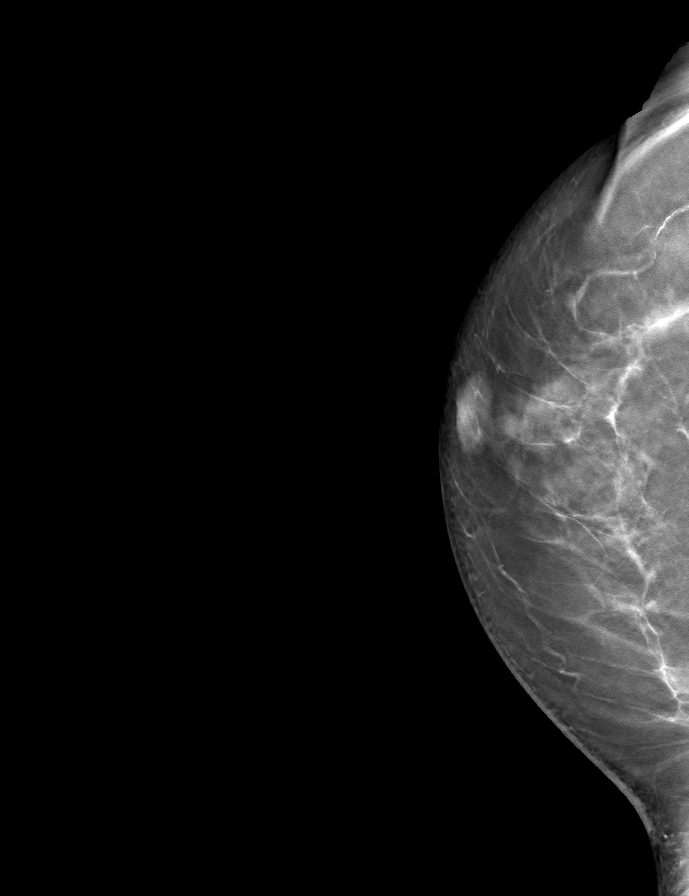

[L MLO tomo · tomo slice 39/77.0]
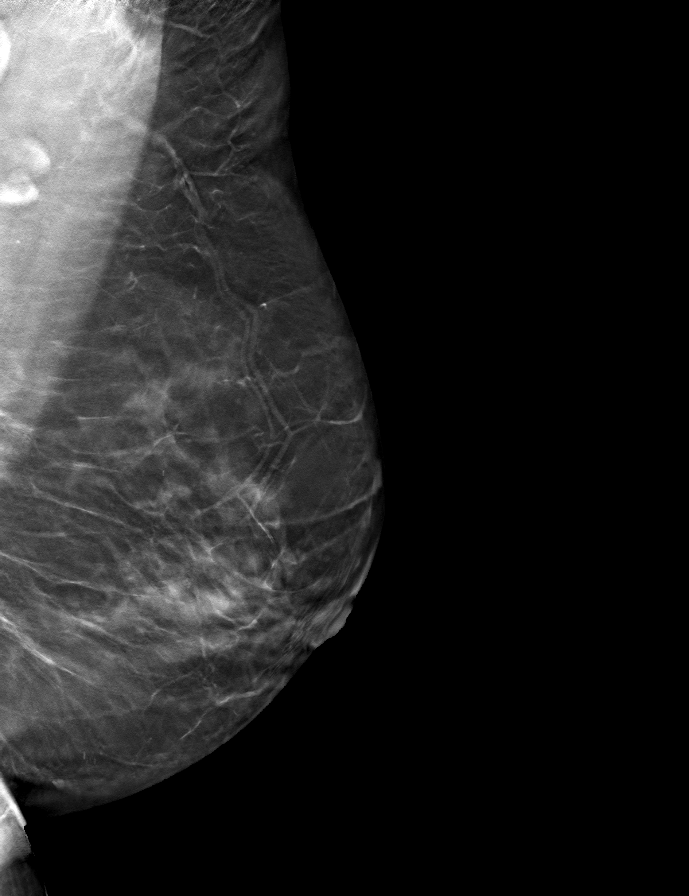

[L CC tomo · tomo slice 37/74.0]
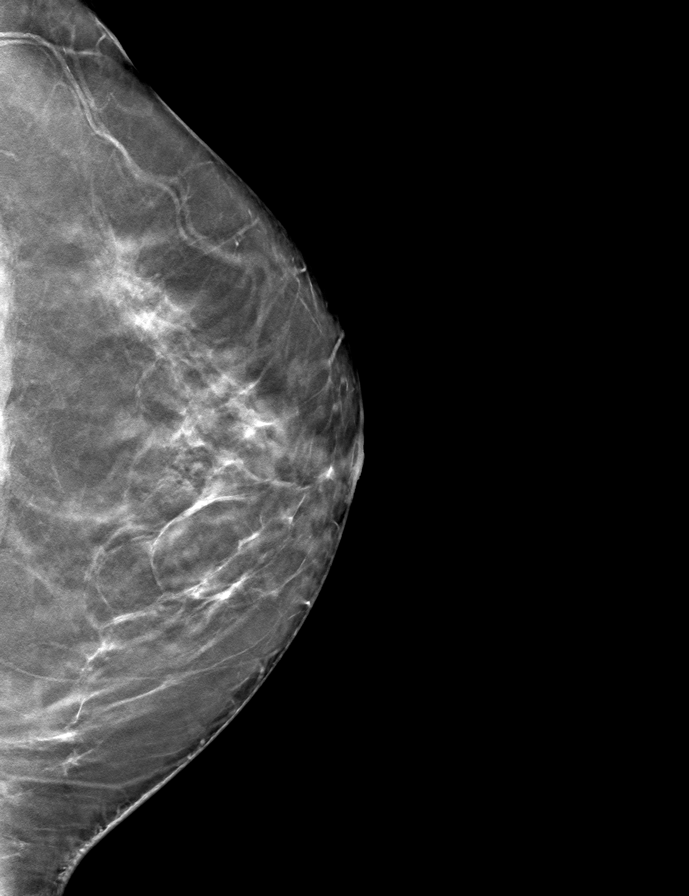

[9 of 24 positions shown; findings below may reference images not displayed]

ACR Breast Density Category b: There are scattered areas of
fibroglandular density.
FINDINGS: There are no findings suspicious for malignancy. The images were
evaluated with computer-aided detection.
IMPRESSION: No mammographic evidence of malignancy. A result letter of this
screening mammogram will be mailed directly to the patient.

RECOMMENDATION:
Screening mammogram in one year. (Code:WJ-I-BG6)

BI-RADS CATEGORY  1: Negative.

## 2022-12-01 ENCOUNTER — Encounter (HOSPITAL_COMMUNITY): Payer: Self-pay

## 2022-12-01 ENCOUNTER — Ambulatory Visit (HOSPITAL_COMMUNITY)
Admission: EM | Admit: 2022-12-01 | Discharge: 2022-12-01 | Disposition: A | Discharge status: Home / Self Care | Payer: Medicare HMO | Attending: Internal Medicine | Admitting: Internal Medicine

## 2022-12-01 DIAGNOSIS — T63441A Toxic effect of venom of bees, accidental (unintentional), initial encounter: Secondary | ICD-10-CM | POA: Diagnosis not present

## 2022-12-01 MED ORDER — PREDNISONE 20 MG PO TABS: 40.0000 mg | ORAL_TABLET | Freq: Every day | ORAL | 0 refills | Status: AC

## 2022-12-01 NOTE — ED Provider Notes (Signed)
[facility]    CSN: [id number] Arrival date & time: 12/01/22  0802      History   Chief Complaint Chief Complaint  Patient presents with   Insect Bite    HPI Laurie Allen is a 77 y.o. female.   Patient presents to urgent care for evaluation of possible bee sting to the chin causing swelling and itching to the surrounding area.  Suspected bee sting happened on Sunday, November 29, 2022.  Patient states the chin became very swollen and itchy immediately after the sting.  Swelling has been stable since onset.  No rash/urticaria to the chin. Denies vision changes, sore throat, throat swelling, shortness of breath, wheezing, chest pain, heart palpitations, neck pain, and dizziness. Denies history of anaphylaxis to bee stings/other environmental, food, or medication allergens. She has been using tylenol as needed for symptoms and has been using A&D ointment to the area without relief.      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   Neuromuscular disorder (HCC)    tingling/burning in feet - tx gabapentin   Palpitations    History r/t anxiety on LORazepam - no problems since meds   Personal history of chemotherapy    Personal history of radiation therapy    Rash 06/04/2011   history   SVD (spontaneous vaginal delivery)    x 4    Patient Active Problem List   Diagnosis Date Noted   Chest pain of uncertain etiology 05/09/2021   Heart murmur 05/09/2021   Anxiety disorder 04/19/2014   Prolapse of female pelvic organs 12/18/2013   GERD (gastroesophageal reflux disease) 12/18/2011   Rash 06/04/2011   Palpitations 02/24/2011   HTN (hypertension) 02/24/2011   Breast cancer of upper-outer quadrant of right female breast (HCC) 06/05/2010    Past Surgical History:  Procedure  Laterality Date   25 HOUR PH STUDY  01/18/2012   Procedure: 24 HOUR PH STUDY;  Surgeon: Charolett Bumpers, MD;  Location: [facility];  Service: Endoscopy;  Laterality: N/A;   ANTERIOR AND POSTERIOR REPAIR N/A 12/18/2013   Procedure: ANTERIOR (CYSTOCELE) ;  Surgeon: Zelphia Cairo, MD;  Location: [facility];  Service: Gynecology;  Laterality: N/A;   BLADDER SURGERY     BREAST LUMPECTOMY Right 2012   with lymph nodes   BREAST SURGERY     ESOPHAGEAL MANOMETRY  01/18/2012   Procedure: ESOPHAGEAL MANOMETRY (EM);  Surgeon: Charolett Bumpers, MD;  Location: [facility];  Service: Endoscopy;  Laterality: N/A;   FOOT SURGERY     bilateral bunions removed   LAPAROSCOPIC ASSISTED VAGINAL HYSTERECTOMY Bilateral 12/18/2013   Procedure: LAPAROSCOPIC ASSISTED VAGINAL HYSTERECTOMY WITH BILATERAL SALPINGO OOPHORECTOMY;  Surgeon: Zelphia Cairo, MD;  Location: [facility];  Service: Gynecology;  Laterality: Bilateral;   MASTECTOMY PARTIAL / LUMPECTOMY W/ AXILLARY LYMPHADENECTOMY  01/06/2011   Right- Dr Jamey Ripa   [facility] REMOVAL  10/12/2011   Procedure: REMOVAL PORT-A-CATH;  Surgeon: Currie Paris, MD;  Location: [facility];  Service: General;  Laterality: Right;   PORTACATH PLACEMENT  06/17/2010   TUBAL LIGATION      OB History   No obstetric history on file.      Home Medications    Prior to Admission medications   Medication Sig Start  Date End Date Taking? Authorizing Provider  predniSONE (DELTASONE) 20 MG tablet Take 2 tablets (40 mg total) by mouth daily for 5 days. 12/01/22 12/06/22 Yes StanhopeDonavan Burnet, FNP  Ascorbic Acid (VITAMIN C) 1000 MG tablet Take 1,000 mg by mouth daily.    [provider]  B Complex Vitamins (VITAMIN B-COMPLEX PO) Take 1 tablet by mouth daily.     [provider]  calcium-vitamin D (OSCAL WITH D) 500-200 MG-UNIT per tablet Take 2 tablets by mouth daily with breakfast.    [provider]  Cholecalciferol (VITAMIN D3) 1000 UNITS  CAPS Take 1 capsule by mouth daily.     [provider]  metoprolol tartrate (LOPRESSOR) 100 MG tablet Take 1 tablet (100 mg total) by mouth as directed. Take (1) tablet 2 hours before your CT 05/09/21   Jake Bathe, MD  polyvinyl alcohol (LIQUIFILM TEARS) 1.4 % ophthalmic solution Place 1 drop into both eyes daily as needed for dry eyes.     [provider]    Family History Family History  Problem Relation Age of Onset   Cancer Father 45       ? type    Social History Social History   Tobacco Use   Smoking status: Never   Smokeless tobacco: Never  Vaping Use   Vaping Use: Never used  Substance Use Topics   Alcohol use: No   Drug use: No     Allergies   Compazine and Prochlorperazine   Review of Systems Review of Systems Per HPI  Physical Exam Triage Vital Signs ED Triage Vitals  Enc Vitals Group     BP 12/01/22 0814 (!) 151/84     Pulse Rate 12/01/22 0814 68     Resp 12/01/22 0814 16     Temp 12/01/22 0814 97.6 F (36.4 C)     Temp Source 12/01/22 0814 Oral     SpO2 12/01/22 0814 98 %     Weight --      Height --      Head Circumference --      Peak Flow --      Pain Score 12/01/22 0816 1     Pain Loc --      Pain Edu? --      Excl. in GC? --    No data found.  Updated Vital Signs BP (!) 151/84 (BP Location: Left Arm)   Pulse 68   Temp 97.6 F (36.4 C) (Oral)   Resp 16   SpO2 98%   Visual Acuity Right Eye Distance:   Left Eye Distance:   Bilateral Distance:    Right Eye Near:   Left Eye Near:    Bilateral Near:     Physical Exam Vitals and nursing note reviewed.  Constitutional:      Appearance: She is not ill-appearing or toxic-appearing.  HENT:     Head: Normocephalic and atraumatic.     Jaw: There is normal jaw occlusion.     Right Ear: Hearing and external ear normal.     Left Ear: Hearing and external ear normal.     Nose: Nose normal.     Mouth/Throat:     Lips: Pink.     Mouth: Mucous membranes are  moist. No injury.     Tongue: No lesions. Tongue does not deviate from midline.     Palate: No mass and lesions.     Pharynx: Oropharynx is clear. Uvula midline. No pharyngeal swelling, oropharyngeal exudate,  posterior oropharyngeal erythema or uvula swelling.     Tonsils: No tonsillar exudate or tonsillar abscesses.  Eyes:     General: Lids are normal. Vision grossly intact. Gaze aligned appropriately.     Extraocular Movements: Extraocular movements intact.     Conjunctiva/sclera: Conjunctivae normal.  Neck:     Trachea: Trachea and phonation normal.      Comments: Area of soft tissue swelling and erythema to area indicated above near the submental region.  Swelling has spread superiorly to the lower jaw.  HEENT exam is stable.  No muffled voice sounds or difficulty swallowing.  Maintaining secretions without difficulty. Cardiovascular:     Rate and Rhythm: Normal rate and regular rhythm.     Heart sounds: Normal heart sounds, S1 normal and S2 normal.  Pulmonary:     Effort: Pulmonary effort is normal. No respiratory distress.     Breath sounds: Normal breath sounds and air entry.  Musculoskeletal:     Cervical back: Normal range of motion and neck supple. No edema. No pain with movement, spinous process tenderness or muscular tenderness. Normal range of motion.  Skin:    General: Skin is warm and dry.     Capillary Refill: Capillary refill takes less than 2 seconds.     Findings: No rash.  Neurological:     General: No focal deficit present.     Mental Status: She is alert and oriented to person, place, and time. Mental status is at baseline.     Cranial Nerves: No dysarthria or facial asymmetry.  Psychiatric:        Mood and Affect: Mood normal.        Speech: Speech normal.        Behavior: Behavior normal.        Thought Content: Thought content normal.        Judgment: Judgment normal.      UC Treatments / Results  Labs (all labs ordered are listed, but only abnormal  results are displayed) Labs Reviewed - No data to display  EKG   Radiology No results found.  Procedures Procedures (including critical care time)  Medications Ordered in UC Medications - No data to display  Initial Impression / Assessment and Plan / UC Course  I have reviewed the triage vital signs and the nursing notes.  Pertinent labs & imaging results that were available during my care of the patient were reviewed by me and considered in my medical decision making (see chart for details).   1.  Bee sting reaction Presentation is consistent with allergic reaction to bee sting.  No signs of anaphylaxis or systemic symptoms.  HEENT exam is stable.  Patient's vital signs are hemodynamically stable and she is overall well-appearing.  Will manage this with prednisone 40 mg once daily for the next 5 days with food to reduce swelling and irritation.  May take Claritin once daily at bedtime to help with itching to the swollen area.  Advised to take prednisone with food to avoid stomach upset.  Reviewed recent blood work from November 2022 showing normal kidney function.  She may follow-up with PCP as needed for ongoing evaluation.   Discussed physical exam and available lab work findings in clinic with patient.  Counseled patient regarding appropriate use of medications and potential side effects for all medications recommended or prescribed today. Discussed red flag signs and symptoms of worsening condition,when to call the PCP office, return to urgent care, and when to seek higher level  of care in the emergency department. Patient verbalizes understanding and agreement with plan. All questions answered. Patient discharged in stable condition.    Final Clinical Impressions(s) / UC Diagnoses   Final diagnoses:  Bee sting reaction, accidental or unintentional, initial encounter     Discharge Instructions      Take prednisone 40 mg once daily for the next 5 days with food to reduce  swelling and irritation related to allergic reaction.  Take Claritin once daily at bedtime to help with itching to the swollen area.  Avoid scratching the area of inflammation and swelling.   If you develop any new or worsening symptoms or do not improve in the next 2 to 3 days, please return.  If your symptoms are severe, please go to the emergency room.  Follow-up with your primary care provider for further evaluation and management of your symptoms as well as ongoing wellness visits.  I hope you feel better!     ED Prescriptions     Medication Sig Dispense Auth. Provider   predniSONE (DELTASONE) 20 MG tablet Take 2 tablets (40 mg total) by mouth daily for 5 days. 10 tablet Carlisle Beers, FNP      PDMP not reviewed this encounter.   Reita May [place], [place] 12/01/22 [phone]

## 2022-12-01 NOTE — ED Triage Notes (Signed)
Patient states she was stung by a bee under the chin 2 days ago. Patient has redness and swelling present.  Patient states she has been taking Tylenol and using A and D ointment.

## 2022-12-01 NOTE — Discharge Instructions (Addendum)
Take prednisone 40 mg once daily for the next 5 days with food to reduce swelling and irritation related to allergic reaction.  Take Claritin once daily at bedtime to help with itching to the swollen area.  Avoid scratching the area of inflammation and swelling.   If you develop any new or worsening symptoms or do not improve in the next 2 to 3 days, please return.  If your symptoms are severe, please go to the emergency room.  Follow-up with your primary care provider for further evaluation and management of your symptoms as well as ongoing wellness visits.  I hope you feel better!

## 2023-01-13 DIAGNOSIS — Z6829 Body mass index (BMI) 29.0-29.9, adult: Secondary | ICD-10-CM | POA: Diagnosis not present

## 2023-01-13 DIAGNOSIS — Z01419 Encounter for gynecological examination (general) (routine) without abnormal findings: Secondary | ICD-10-CM | POA: Diagnosis not present

## 2023-01-27 DIAGNOSIS — Z23 Encounter for immunization: Secondary | ICD-10-CM | POA: Diagnosis not present

## 2023-01-27 DIAGNOSIS — M545 Low back pain, unspecified: Secondary | ICD-10-CM | POA: Diagnosis not present

## 2023-03-06 ENCOUNTER — Emergency Department (HOSPITAL_BASED_OUTPATIENT_CLINIC_OR_DEPARTMENT_OTHER)
Admission: EM | Admit: 2023-03-06 | Discharge: 2023-03-06 | Disposition: A | Discharge status: Home / Self Care | Payer: Medicare HMO | Attending: Emergency Medicine | Admitting: Emergency Medicine

## 2023-03-06 DIAGNOSIS — K047 Periapical abscess without sinus: Secondary | ICD-10-CM | POA: Diagnosis not present

## 2023-03-06 DIAGNOSIS — K0889 Other specified disorders of teeth and supporting structures: Secondary | ICD-10-CM | POA: Diagnosis not present

## 2023-03-06 MED ORDER — AMOXICILLIN-POT CLAVULANATE 875-125 MG PO TABS: 1.0000 | ORAL_TABLET | Freq: Two times a day (BID) | ORAL | 0 refills | Status: DC

## 2023-03-06 NOTE — Discharge Instructions (Signed)
Please use Tylenol or ibuprofen for pain.  You may use 600 mg ibuprofen every 6 hours or 1000 mg of Tylenol every 6 hours.  You may choose to alternate between the 2.  This would be most effective.  Not to exceed 4 g of Tylenol within 24 hours.  Not to exceed 3200 mg ibuprofen 24 hours.  Please take the entire course of antibiotics that I prescribed and follow-up closely with your dentist for further evaluation.  Please return if you have worsening swelling, especially develop severe fever, or begin to develop difficulty swallowing.

## 2023-03-06 NOTE — ED Provider Notes (Signed)
[place] [facility] Provider Note   CSN: [id number] Arrival date & time: 03/06/23  4098     History  Chief Complaint  Patient presents with   Dental Pain   Facial Swelling    Laurie Allen is a 77 y.o. female with overall noncontributory past medical history presents concern for right upper toothache that began a few days ago which had progressed to some facial swelling beginning yesterday.  Patient reports that she had a bridge put in the left side about a year ago.  She has several cavity fills and bridges throughout the oropharynx.  She denies any difficulty swallowing, fever at home.  She endorses her pain is around 5/10.  She has no allergies to antibiotics.  Patient with plan to follow-up with dentist next week but reports not open on the weekend.   Dental Pain      Home Medications Prior to Admission medications   Medication Sig Start Date End Date Taking? Authorizing Provider  amoxicillin-clavulanate (AUGMENTIN) 875-125 MG tablet Take 1 tablet by mouth every 12 (twelve) hours. 03/06/23  Yes Jahmal Dunavant H, PA-C  Ascorbic Acid (VITAMIN C) 1000 MG tablet Take 1,000 mg by mouth daily.    [provider]  B Complex Vitamins (VITAMIN B-COMPLEX PO) Take 1 tablet by mouth daily.     [provider]  calcium-vitamin D (OSCAL WITH D) 500-200 MG-UNIT per tablet Take 2 tablets by mouth daily with breakfast.    [provider]  Cholecalciferol (VITAMIN D3) 1000 UNITS CAPS Take 1 capsule by mouth daily.     [provider]  metoprolol tartrate (LOPRESSOR) 100 MG tablet Take 1 tablet (100 mg total) by mouth as directed. Take (1) tablet 2 hours before your CT 05/09/21   Jake Bathe, MD  polyvinyl alcohol (LIQUIFILM TEARS) 1.4 % ophthalmic solution Place 1 drop into both eyes daily as needed for dry eyes.     [provider]      Allergies    Compazine and Prochlorperazine    Review of Systems    Review of Systems  All other systems reviewed and are negative.   Physical Exam Updated Vital Signs BP (!) 147/92 (BP Location: Left Arm)   Pulse 79   Temp 98.3 F (36.8 C) (Oral)   Resp 16   SpO2 99%  Physical Exam Vitals and nursing note reviewed.  Constitutional:      General: She is not in acute distress.    Appearance: Normal appearance.  HENT:     Head: Normocephalic and atraumatic.     Mouth/Throat:     Comments: No significant posterior oropharynx erythema, swelling, exudate. Uvula midline, tonsils 1+ bilaterally.  No trismus, stridor, evidence of PTA, floor of mouth swelling or redness.   Multiple missing teeth bilateral sides of the oropharynx, some halitosis noted.  Some gum swelling noted right upper jaw with mild redness, no fluctuant abscess.  The overlying skin tissue of the right buccal cheek is somewhat swollen without any evidence of focal fluid collection, abscess. Eyes:     General:        Right eye: No discharge.        Left eye: No discharge.  Cardiovascular:     Rate and Rhythm: Normal rate and regular rhythm.  Pulmonary:     Effort: Pulmonary effort is normal. No respiratory distress.  Musculoskeletal:        General: No deformity.  Skin:    General:  Skin is warm and dry.  Neurological:     Mental Status: She is alert and oriented to person, place, and time.  Psychiatric:        Mood and Affect: Mood normal.        Behavior: Behavior normal.     ED Results / Procedures / Treatments   Labs (all labs ordered are listed, but only abnormal results are displayed) Labs Reviewed - No data to display  EKG None  Radiology No results found.  Procedures Procedures    Medications Ordered in ED Medications - No data to display  ED Course/ Medical Decision Making/ A&P                                 Medical Decision Making   This an overall well-appearing 77 y.o.  female who presents with concern for dental pain.  Physical exam reveals  evidence of many previous dental procedures, permanent bridges, a few missing teeth noted, some halitosis.  Patient with redness, gum swelling without evidence of gum abscess, periapical abscess, PTA, uvula deviation, pharyngitis, epiglottitis, dysphonia, stridor.  Patient without difficulty swallowing.  No systemic fever, chills.  Given patient's symptoms think it is reasonable to treat with antibiotics.  Especially given patient age did have strong consideration given for CT soft tissue of the face with blood work to ensure for no more severe infection, however given patient's signs and symptoms and clinical appearance I do think it reasonable to start with antibiotics at this time with close dental follow-up.  Patient understands and agrees to this plan.  Encouraged ibuprofen, Tylenol, Orajel, ice for pain control. Encouraged urgent dental follow-up, dental resource guide provided.  Provided Augmentin for antibiotic coverage.  Patient discharged in stable condition at this time, return precautions given.   Final Clinical Impression(s) / ED Diagnoses Final diagnoses:  Dental infection    Rx / DC Orders ED Discharge Orders          Ordered    amoxicillin-clavulanate (AUGMENTIN) 875-125 MG tablet  Every 12 hours        03/06/23 0958              Olene Floss, PA-C 03/06/23 1001    Terald Sleeper, MD 03/06/23 1119

## 2023-03-06 NOTE — ED Notes (Signed)
Pt discharged in stable condition. Pt expressed understanding about discharge instructions and Rx, and to follow up with dentist and to return to ER for any further concerns or complications. Pt ambulated d out with even steady gait, no apparent distress.

## 2023-03-06 NOTE — ED Triage Notes (Signed)
She reports feeling right upper toothache which began this Wed. She c/o persistent right upper toothache with some facial swelling which began yesterday. She tells me "that is where I had a bridge put in about a year ago". She denies fever, and is in no distress.

## 2023-06-08 DIAGNOSIS — L820 Inflamed seborrheic keratosis: Secondary | ICD-10-CM | POA: Diagnosis not present

## 2023-06-08 DIAGNOSIS — D485 Neoplasm of uncertain behavior of skin: Secondary | ICD-10-CM | POA: Diagnosis not present

## 2023-07-27 DIAGNOSIS — Z0000 Encounter for general adult medical examination without abnormal findings: Secondary | ICD-10-CM | POA: Diagnosis not present

## 2023-07-27 DIAGNOSIS — E7800 Pure hypercholesterolemia, unspecified: Secondary | ICD-10-CM | POA: Diagnosis not present

## 2023-07-27 DIAGNOSIS — Z03818 Encounter for observation for suspected exposure to other biological agents ruled out: Secondary | ICD-10-CM | POA: Diagnosis not present

## 2023-07-27 DIAGNOSIS — J069 Acute upper respiratory infection, unspecified: Secondary | ICD-10-CM | POA: Diagnosis not present

## 2023-07-27 DIAGNOSIS — Z5181 Encounter for therapeutic drug level monitoring: Secondary | ICD-10-CM | POA: Diagnosis not present

## 2023-07-27 DIAGNOSIS — Z1331 Encounter for screening for depression: Secondary | ICD-10-CM | POA: Diagnosis not present

## 2023-07-27 DIAGNOSIS — Z23 Encounter for immunization: Secondary | ICD-10-CM | POA: Diagnosis not present

## 2023-07-28 ENCOUNTER — Other Ambulatory Visit: Payer: Self-pay | Admitting: Internal Medicine

## 2023-07-28 DIAGNOSIS — M8580 Other specified disorders of bone density and structure, unspecified site: Secondary | ICD-10-CM

## 2023-08-11 ENCOUNTER — Other Ambulatory Visit: Payer: Self-pay | Admitting: Medical Genetics

## 2023-08-17 ENCOUNTER — Other Ambulatory Visit (HOSPITAL_COMMUNITY): Payer: Self-pay

## 2023-08-20 ENCOUNTER — Other Ambulatory Visit (HOSPITAL_COMMUNITY)
Admission: RE | Admit: 2023-08-20 | Discharge: 2023-08-20 | Disposition: A | Discharge status: Home / Self Care | Payer: Self-pay | Source: Ambulatory Visit | Attending: Oncology | Admitting: Oncology

## 2023-08-27 DIAGNOSIS — D72819 Decreased white blood cell count, unspecified: Secondary | ICD-10-CM | POA: Diagnosis not present

## 2023-09-13 LAB — GENECONNECT MOLECULAR SCREEN: Genetic Analysis Overall Interpretation: NEGATIVE

## 2023-09-22 ENCOUNTER — Other Ambulatory Visit: Payer: Self-pay | Admitting: Family

## 2023-09-22 DIAGNOSIS — Z1231 Encounter for screening mammogram for malignant neoplasm of breast: Secondary | ICD-10-CM

## 2023-10-13 ENCOUNTER — Encounter: Payer: Self-pay | Admitting: Podiatry

## 2023-10-13 ENCOUNTER — Ambulatory Visit: Admitting: Podiatry

## 2023-10-13 DIAGNOSIS — M79674 Pain in right toe(s): Secondary | ICD-10-CM | POA: Diagnosis not present

## 2023-10-13 DIAGNOSIS — R12 Heartburn: Secondary | ICD-10-CM | POA: Insufficient documentation

## 2023-10-13 DIAGNOSIS — B351 Tinea unguium: Secondary | ICD-10-CM

## 2023-10-13 DIAGNOSIS — M79675 Pain in left toe(s): Secondary | ICD-10-CM

## 2023-10-13 DIAGNOSIS — R1310 Dysphagia, unspecified: Secondary | ICD-10-CM | POA: Insufficient documentation

## 2023-10-13 DIAGNOSIS — L600 Ingrowing nail: Secondary | ICD-10-CM | POA: Diagnosis not present

## 2023-10-13 NOTE — Progress Notes (Signed)
 Subjective:   Patient ID: Laurie Allen, female   DOB: 78 y.o.   MRN: [id number]   HPI The patient presents with several problems with 1 being a chronic ingrown toenail painful left big toe medial border very sore when pressed and thick yellow brittle nailbeds 1-5 both feet painful when pressed.  Patient is noted to have good digital perfusion well-oriented x 3 does not smoke likes to be active with all nails found to be thickened   Review of Systems  All other systems reviewed and are negative.       Objective:  Physical Exam Vitals and nursing note reviewed.  Constitutional:      Appearance: She is well-developed.  Pulmonary:     Effort: Pulmonary effort is normal.  Musculoskeletal:        General: Normal range of motion.  Skin:    General: Skin is warm.  Neurological:     Mental Status: She is alert.     Neurovascular status intact muscle strength adequate range of motion adequate with incurvated medial border left hallux painful when pressed no drainage slight redness noted with split hallux nail right all nails somewhat thickened.     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  For the left I went ahead today have recommended correction explained procedure risk and surgery and patient wants this done and I explained the correction that will be necessary.  Patient signed consent form after reviewing and at this point I infiltrated the left big toe 60 mg to Marcaine mixture sterile prep done and using sterile once mentation removed to the medial border exposed matrix applied phenol 3 applications 30 seconds followed by alcohol lavage sterile dressing gave instructions on soaks Wear dressings 24 hours to get up earlier if throbbing were to occur and encouraged to call with questions concerns.  Debrided nailbeds 1-5 right 2 through 5 left no  iatrogenically

## 2023-10-13 NOTE — Patient Instructions (Signed)

## 2023-10-21 ENCOUNTER — Ambulatory Visit
Admission: RE | Admit: 2023-10-21 | Discharge: 2023-10-21 | Disposition: A | Discharge status: Home / Self Care | Source: Ambulatory Visit | Attending: Family | Admitting: Family

## 2023-10-21 DIAGNOSIS — Z1231 Encounter for screening mammogram for malignant neoplasm of breast: Secondary | ICD-10-CM

## 2023-11-23 ENCOUNTER — Ambulatory Visit
Admission: RE | Admit: 2023-11-23 | Discharge: 2023-11-23 | Disposition: A | Discharge status: Home / Self Care | Source: Ambulatory Visit | Attending: Family | Admitting: Family

## 2023-11-23 DIAGNOSIS — Z1231 Encounter for screening mammogram for malignant neoplasm of breast: Secondary | ICD-10-CM | POA: Diagnosis not present

## 2023-12-14 ENCOUNTER — Other Ambulatory Visit (HOSPITAL_BASED_OUTPATIENT_CLINIC_OR_DEPARTMENT_OTHER): Payer: Self-pay | Admitting: Internal Medicine

## 2023-12-14 DIAGNOSIS — M8580 Other specified disorders of bone density and structure, unspecified site: Secondary | ICD-10-CM

## 2023-12-14 DIAGNOSIS — M545 Low back pain, unspecified: Secondary | ICD-10-CM | POA: Diagnosis not present

## 2023-12-27 DIAGNOSIS — E7800 Pure hypercholesterolemia, unspecified: Secondary | ICD-10-CM | POA: Diagnosis not present

## 2023-12-27 DIAGNOSIS — Z853 Personal history of malignant neoplasm of breast: Secondary | ICD-10-CM | POA: Diagnosis not present

## 2024-02-27 DIAGNOSIS — Z853 Personal history of malignant neoplasm of breast: Secondary | ICD-10-CM | POA: Diagnosis not present

## 2024-02-27 DIAGNOSIS — E7800 Pure hypercholesterolemia, unspecified: Secondary | ICD-10-CM | POA: Diagnosis not present

## 2024-03-23 ENCOUNTER — Other Ambulatory Visit: Payer: Medicare HMO

## 2024-03-28 DIAGNOSIS — E7800 Pure hypercholesterolemia, unspecified: Secondary | ICD-10-CM | POA: Diagnosis not present

## 2024-03-28 DIAGNOSIS — Z853 Personal history of malignant neoplasm of breast: Secondary | ICD-10-CM | POA: Diagnosis not present

## 2024-03-29 DIAGNOSIS — Z01419 Encounter for gynecological examination (general) (routine) without abnormal findings: Secondary | ICD-10-CM | POA: Diagnosis not present

## 2024-03-29 DIAGNOSIS — D239 Other benign neoplasm of skin, unspecified: Secondary | ICD-10-CM | POA: Diagnosis not present

## 2024-03-29 DIAGNOSIS — Z6830 Body mass index (BMI) 30.0-30.9, adult: Secondary | ICD-10-CM | POA: Diagnosis not present

## 2024-03-29 DIAGNOSIS — L989 Disorder of the skin and subcutaneous tissue, unspecified: Secondary | ICD-10-CM | POA: Diagnosis not present

## 2024-04-17 ENCOUNTER — Other Ambulatory Visit (HOSPITAL_BASED_OUTPATIENT_CLINIC_OR_DEPARTMENT_OTHER)

## 2024-04-27 ENCOUNTER — Ambulatory Visit (HOSPITAL_BASED_OUTPATIENT_CLINIC_OR_DEPARTMENT_OTHER)
Admission: RE | Admit: 2024-04-27 | Discharge: 2024-04-27 | Disposition: A | Discharge status: Home / Self Care | Source: Ambulatory Visit | Attending: Internal Medicine | Admitting: Internal Medicine

## 2024-04-27 DIAGNOSIS — M8580 Other specified disorders of bone density and structure, unspecified site: Secondary | ICD-10-CM | POA: Diagnosis not present

## 2024-04-27 DIAGNOSIS — M8589 Other specified disorders of bone density and structure, multiple sites: Secondary | ICD-10-CM | POA: Diagnosis not present

## 2024-04-27 DIAGNOSIS — Z780 Asymptomatic menopausal state: Secondary | ICD-10-CM | POA: Diagnosis not present

## 2024-04-28 DIAGNOSIS — E7800 Pure hypercholesterolemia, unspecified: Secondary | ICD-10-CM | POA: Diagnosis not present

## 2024-04-28 DIAGNOSIS — Z853 Personal history of malignant neoplasm of breast: Secondary | ICD-10-CM | POA: Diagnosis not present
